# Patient Record
Sex: Female | Born: 1950 | Race: Black or African American | Hispanic: No | Marital: Married | Smoking: Never smoker
Health system: Southern US, Community
[De-identification: ages and names within clinical notes are randomized; demographics above are authoritative.]

## PROBLEM LIST (undated history)

## (undated) DIAGNOSIS — T7840XA Allergy, unspecified, initial encounter: Secondary | ICD-10-CM

## (undated) DIAGNOSIS — C801 Malignant (primary) neoplasm, unspecified: Secondary | ICD-10-CM

## (undated) DIAGNOSIS — Z9889 Other specified postprocedural states: Secondary | ICD-10-CM

## (undated) DIAGNOSIS — IMO0001 Reserved for inherently not codable concepts without codable children: Secondary | ICD-10-CM

## (undated) DIAGNOSIS — Z5189 Encounter for other specified aftercare: Secondary | ICD-10-CM

## (undated) DIAGNOSIS — K219 Gastro-esophageal reflux disease without esophagitis: Secondary | ICD-10-CM

## (undated) DIAGNOSIS — M1990 Unspecified osteoarthritis, unspecified site: Secondary | ICD-10-CM

## (undated) DIAGNOSIS — R112 Nausea with vomiting, unspecified: Secondary | ICD-10-CM

## (undated) HISTORY — DX: Malignant (primary) neoplasm, unspecified: C80.1

## (undated) HISTORY — DX: Unspecified osteoarthritis, unspecified site: M19.90

## (undated) HISTORY — DX: Reserved for inherently not codable concepts without codable children: IMO0001

## (undated) HISTORY — DX: Allergy, unspecified, initial encounter: T78.40XA

## (undated) HISTORY — DX: Encounter for other specified aftercare: Z51.89

## (undated) HISTORY — PX: BREAST SURGERY: SHX581

---

## 1982-01-13 HISTORY — PX: ABDOMINAL HYSTERECTOMY: SHX81

## 2002-01-11 ENCOUNTER — Encounter: Payer: Self-pay | Admitting: Internal Medicine

## 2002-01-11 ENCOUNTER — Ambulatory Visit (HOSPITAL_COMMUNITY): Admission: RE | Admit: 2002-01-11 | Discharge: 2002-01-11 | Payer: Self-pay | Admitting: Internal Medicine

## 2003-03-01 ENCOUNTER — Ambulatory Visit (HOSPITAL_COMMUNITY): Admission: RE | Admit: 2003-03-01 | Discharge: 2003-03-01 | Payer: Self-pay | Admitting: Internal Medicine

## 2003-03-27 ENCOUNTER — Ambulatory Visit (HOSPITAL_COMMUNITY): Admission: RE | Admit: 2003-03-27 | Discharge: 2003-03-27 | Payer: Self-pay | Admitting: Internal Medicine

## 2003-03-31 ENCOUNTER — Ambulatory Visit (HOSPITAL_COMMUNITY): Admission: RE | Admit: 2003-03-31 | Discharge: 2003-03-31 | Payer: Self-pay | Admitting: Internal Medicine

## 2003-06-21 ENCOUNTER — Emergency Department (HOSPITAL_COMMUNITY): Admission: EM | Admit: 2003-06-21 | Discharge: 2003-06-21 | Payer: Self-pay | Admitting: Emergency Medicine

## 2004-05-02 ENCOUNTER — Ambulatory Visit (HOSPITAL_COMMUNITY): Admission: RE | Admit: 2004-05-02 | Discharge: 2004-05-02 | Payer: Self-pay | Admitting: Internal Medicine

## 2004-10-15 ENCOUNTER — Ambulatory Visit (HOSPITAL_COMMUNITY): Admission: RE | Admit: 2004-10-15 | Discharge: 2004-10-15 | Payer: Self-pay | Admitting: Internal Medicine

## 2004-10-16 ENCOUNTER — Emergency Department (HOSPITAL_COMMUNITY): Admission: EM | Admit: 2004-10-16 | Discharge: 2004-10-16 | Payer: Self-pay | Admitting: Emergency Medicine

## 2004-11-08 ENCOUNTER — Ambulatory Visit: Payer: Self-pay | Admitting: Internal Medicine

## 2004-11-08 ENCOUNTER — Encounter (INDEPENDENT_AMBULATORY_CARE_PROVIDER_SITE_OTHER): Payer: Self-pay | Admitting: Internal Medicine

## 2004-11-08 ENCOUNTER — Ambulatory Visit (HOSPITAL_COMMUNITY): Admission: RE | Admit: 2004-11-08 | Discharge: 2004-11-08 | Payer: Self-pay | Admitting: Internal Medicine

## 2005-08-06 ENCOUNTER — Ambulatory Visit: Payer: Self-pay | Admitting: Family Medicine

## 2005-09-29 ENCOUNTER — Ambulatory Visit: Payer: Self-pay | Admitting: Family Medicine

## 2005-11-14 ENCOUNTER — Encounter: Admission: RE | Admit: 2005-11-14 | Discharge: 2005-11-14 | Payer: Self-pay | Admitting: Family Medicine

## 2006-02-13 ENCOUNTER — Ambulatory Visit: Payer: Self-pay | Admitting: Family Medicine

## 2006-12-16 ENCOUNTER — Encounter: Admission: RE | Admit: 2006-12-16 | Discharge: 2006-12-16 | Payer: Self-pay | Admitting: Family Medicine

## 2006-12-17 ENCOUNTER — Ambulatory Visit (HOSPITAL_COMMUNITY): Admission: RE | Admit: 2006-12-17 | Discharge: 2006-12-17 | Payer: Self-pay | Admitting: Family Medicine

## 2006-12-18 ENCOUNTER — Encounter: Payer: Self-pay | Admitting: Family Medicine

## 2006-12-18 LAB — CONVERTED CEMR LAB
BUN: 12 mg/dL (ref 6–23)
Basophils Relative: 0 % (ref 0–1)
Calcium: 9.4 mg/dL (ref 8.4–10.5)
Cholesterol: 159 mg/dL (ref 0–200)
Creatinine, Ser: 0.85 mg/dL (ref 0.40–1.20)
Eosinophils Absolute: 0.2 10*3/uL (ref 0.2–0.7)
Eosinophils Relative: 3 % (ref 0–5)
MCHC: 30.5 g/dL (ref 30.0–36.0)
MCV: 85.1 fL (ref 78.0–100.0)
Monocytes Absolute: 0.5 10*3/uL (ref 0.1–1.0)
Monocytes Relative: 8 % (ref 3–12)
Neutrophils Relative %: 62 % (ref 43–77)
Potassium: 4.3 meq/L (ref 3.5–5.3)
RBC: 4.7 M/uL (ref 3.87–5.11)
Triglycerides: 44 mg/dL (ref <150)

## 2006-12-28 ENCOUNTER — Ambulatory Visit: Payer: Self-pay | Admitting: Family Medicine

## 2006-12-28 ENCOUNTER — Other Ambulatory Visit: Admission: RE | Admit: 2006-12-28 | Discharge: 2006-12-28 | Payer: Self-pay | Admitting: Family Medicine

## 2006-12-28 ENCOUNTER — Encounter: Payer: Self-pay | Admitting: Family Medicine

## 2007-01-14 ENCOUNTER — Encounter: Payer: Self-pay | Admitting: Family Medicine

## 2007-07-21 ENCOUNTER — Encounter: Payer: Self-pay | Admitting: Family Medicine

## 2007-07-21 DIAGNOSIS — F411 Generalized anxiety disorder: Secondary | ICD-10-CM

## 2007-07-21 DIAGNOSIS — M25569 Pain in unspecified knee: Secondary | ICD-10-CM

## 2007-07-21 DIAGNOSIS — K5909 Other constipation: Secondary | ICD-10-CM

## 2007-07-21 DIAGNOSIS — K219 Gastro-esophageal reflux disease without esophagitis: Secondary | ICD-10-CM

## 2007-12-17 ENCOUNTER — Encounter: Admission: RE | Admit: 2007-12-17 | Discharge: 2007-12-17 | Payer: Self-pay | Admitting: Family Medicine

## 2007-12-29 ENCOUNTER — Encounter: Payer: Self-pay | Admitting: Family Medicine

## 2007-12-29 ENCOUNTER — Ambulatory Visit: Payer: Self-pay | Admitting: Family Medicine

## 2007-12-29 ENCOUNTER — Other Ambulatory Visit: Admission: RE | Admit: 2007-12-29 | Discharge: 2007-12-29 | Payer: Self-pay | Admitting: Family Medicine

## 2007-12-29 DIAGNOSIS — R5383 Other fatigue: Secondary | ICD-10-CM

## 2007-12-29 DIAGNOSIS — R5381 Other malaise: Secondary | ICD-10-CM

## 2007-12-29 DIAGNOSIS — D239 Other benign neoplasm of skin, unspecified: Secondary | ICD-10-CM | POA: Insufficient documentation

## 2007-12-29 LAB — CONVERTED CEMR LAB: OCCULT 1: NEGATIVE

## 2007-12-30 ENCOUNTER — Encounter: Payer: Self-pay | Admitting: Family Medicine

## 2007-12-30 LAB — CONVERTED CEMR LAB
CO2: 24 meq/L (ref 19–32)
Calcium: 9 mg/dL (ref 8.4–10.5)
Chloride: 107 meq/L (ref 96–112)
Creatinine, Ser: 0.8 mg/dL (ref 0.40–1.20)
Eosinophils Relative: 4 % (ref 0–5)
Glucose, Bld: 81 mg/dL (ref 70–99)
HCT: 40 % (ref 36.0–46.0)
Hemoglobin: 11.8 g/dL — ABNORMAL LOW (ref 12.0–15.0)
LDL Cholesterol: 97 mg/dL (ref 0–99)
Lymphocytes Relative: 29 % (ref 12–46)
Lymphs Abs: 2.2 10*3/uL (ref 0.7–4.0)
Monocytes Absolute: 0.6 10*3/uL (ref 0.1–1.0)
Neutro Abs: 4.4 10*3/uL (ref 1.7–7.7)
RBC: 4.71 M/uL (ref 3.87–5.11)
Retic Ct Pct: 0.6 % (ref 0.4–3.1)
Total CHOL/HDL Ratio: 2.7
WBC: 7.5 10*3/uL (ref 4.0–10.5)

## 2008-01-10 ENCOUNTER — Encounter: Payer: Self-pay | Admitting: Family Medicine

## 2008-02-18 ENCOUNTER — Encounter: Payer: Self-pay | Admitting: Family Medicine

## 2008-12-18 ENCOUNTER — Encounter: Admission: RE | Admit: 2008-12-18 | Discharge: 2008-12-18 | Payer: Self-pay | Admitting: Family Medicine

## 2009-01-11 ENCOUNTER — Ambulatory Visit: Payer: Self-pay | Admitting: Family Medicine

## 2009-01-11 ENCOUNTER — Other Ambulatory Visit: Admission: RE | Admit: 2009-01-11 | Discharge: 2009-01-11 | Payer: Self-pay | Admitting: Family Medicine

## 2009-01-11 DIAGNOSIS — E559 Vitamin D deficiency, unspecified: Secondary | ICD-10-CM

## 2009-01-11 DIAGNOSIS — L910 Hypertrophic scar: Secondary | ICD-10-CM

## 2009-01-11 LAB — CONVERTED CEMR LAB: OCCULT 1: NEGATIVE

## 2009-01-13 HISTORY — PX: TOTAL KNEE ARTHROPLASTY: SHX125

## 2009-01-18 ENCOUNTER — Telehealth: Payer: Self-pay | Admitting: Family Medicine

## 2009-01-22 ENCOUNTER — Encounter: Payer: Self-pay | Admitting: Family Medicine

## 2009-01-25 LAB — CONVERTED CEMR LAB
BUN: 14 mg/dL (ref 6–23)
Chloride: 106 meq/L (ref 96–112)
Cholesterol: 165 mg/dL (ref 0–200)
HDL: 61 mg/dL (ref >39)
Hemoglobin: 12.1 g/dL (ref 12.0–15.0)
LDL Cholesterol: 95 mg/dL (ref 0–99)
MCHC: 32.6 g/dL (ref 30.0–36.0)
Potassium: 4.3 meq/L (ref 3.5–5.3)
RDW: 15.8 % — ABNORMAL HIGH (ref 11.5–15.5)
Sodium: 142 meq/L (ref 135–145)
Total CHOL/HDL Ratio: 2.7
Triglycerides: 44 mg/dL (ref <150)
VLDL: 9 mg/dL (ref 0–40)

## 2009-02-20 ENCOUNTER — Encounter: Payer: Self-pay | Admitting: Family Medicine

## 2009-02-21 ENCOUNTER — Ambulatory Visit: Payer: Self-pay | Admitting: Family Medicine

## 2009-02-21 ENCOUNTER — Ambulatory Visit (HOSPITAL_COMMUNITY): Admission: RE | Admit: 2009-02-21 | Discharge: 2009-02-21 | Payer: Self-pay | Admitting: Family Medicine

## 2009-02-21 DIAGNOSIS — R109 Unspecified abdominal pain: Secondary | ICD-10-CM

## 2009-02-21 DIAGNOSIS — M546 Pain in thoracic spine: Secondary | ICD-10-CM

## 2009-02-21 LAB — CONVERTED CEMR LAB
Blood in Urine, dipstick: NEGATIVE
Ketones, urine, test strip: NEGATIVE
Nitrite: NEGATIVE
Protein, U semiquant: NEGATIVE
Specific Gravity, Urine: 1.015
Urobilinogen, UA: 1
Vit D, 25-Hydroxy: 24 ng/mL — ABNORMAL LOW (ref 30–89)
WBC Urine, dipstick: NEGATIVE

## 2009-02-22 DIAGNOSIS — N3000 Acute cystitis without hematuria: Secondary | ICD-10-CM

## 2009-03-21 ENCOUNTER — Telehealth: Payer: Self-pay | Admitting: Family Medicine

## 2009-03-22 ENCOUNTER — Ambulatory Visit: Payer: Self-pay | Admitting: Family Medicine

## 2009-03-22 DIAGNOSIS — B373 Candidiasis of vulva and vagina: Secondary | ICD-10-CM | POA: Insufficient documentation

## 2009-03-24 ENCOUNTER — Encounter: Payer: Self-pay | Admitting: Family Medicine

## 2009-03-26 ENCOUNTER — Telehealth: Payer: Self-pay | Admitting: Family Medicine

## 2009-03-26 LAB — CONVERTED CEMR LAB: Gardnerella vaginalis: NEGATIVE

## 2009-04-24 ENCOUNTER — Telehealth: Payer: Self-pay | Admitting: Family Medicine

## 2009-04-26 ENCOUNTER — Encounter: Payer: Self-pay | Admitting: Family Medicine

## 2009-10-24 ENCOUNTER — Ambulatory Visit: Payer: Self-pay | Admitting: Family Medicine

## 2009-10-25 ENCOUNTER — Telehealth: Payer: Self-pay | Admitting: Family Medicine

## 2009-10-26 ENCOUNTER — Encounter: Payer: Self-pay | Admitting: Family Medicine

## 2009-11-08 ENCOUNTER — Encounter (INDEPENDENT_AMBULATORY_CARE_PROVIDER_SITE_OTHER): Payer: Self-pay | Admitting: *Deleted

## 2009-11-09 ENCOUNTER — Encounter: Payer: Self-pay | Admitting: Family Medicine

## 2009-11-13 HISTORY — PX: JOINT REPLACEMENT: SHX530

## 2009-11-28 ENCOUNTER — Inpatient Hospital Stay (HOSPITAL_COMMUNITY): Admission: RE | Admit: 2009-11-28 | Discharge: 2009-12-02 | Payer: Self-pay | Admitting: Orthopedic Surgery

## 2009-12-31 ENCOUNTER — Encounter (HOSPITAL_COMMUNITY)
Admission: RE | Admit: 2009-12-31 | Discharge: 2010-01-30 | Payer: Self-pay | Source: Home / Self Care | Attending: Orthopedic Surgery | Admitting: Orthopedic Surgery

## 2010-01-03 ENCOUNTER — Telehealth (INDEPENDENT_AMBULATORY_CARE_PROVIDER_SITE_OTHER): Payer: Self-pay | Admitting: *Deleted

## 2010-01-04 DIAGNOSIS — Z9189 Other specified personal risk factors, not elsewhere classified: Secondary | ICD-10-CM | POA: Insufficient documentation

## 2010-01-08 ENCOUNTER — Ambulatory Visit: Payer: Self-pay | Admitting: Family Medicine

## 2010-01-10 ENCOUNTER — Encounter
Admission: RE | Admit: 2010-01-10 | Discharge: 2010-01-10 | Payer: Self-pay | Source: Home / Self Care | Attending: Family Medicine | Admitting: Family Medicine

## 2010-01-17 ENCOUNTER — Encounter: Payer: Self-pay | Admitting: Family Medicine

## 2010-01-17 ENCOUNTER — Encounter
Admission: RE | Admit: 2010-01-17 | Discharge: 2010-01-17 | Payer: Self-pay | Source: Home / Self Care | Attending: Family Medicine | Admitting: Family Medicine

## 2010-01-21 ENCOUNTER — Encounter: Payer: Self-pay | Admitting: Family Medicine

## 2010-01-22 ENCOUNTER — Telehealth (INDEPENDENT_AMBULATORY_CARE_PROVIDER_SITE_OTHER): Payer: Self-pay | Admitting: *Deleted

## 2010-01-23 ENCOUNTER — Encounter
Admission: RE | Admit: 2010-01-23 | Discharge: 2010-01-23 | Payer: Self-pay | Source: Home / Self Care | Attending: Family Medicine | Admitting: Family Medicine

## 2010-01-25 ENCOUNTER — Ambulatory Visit: Payer: Self-pay | Admitting: Oncology

## 2010-01-25 ENCOUNTER — Encounter: Payer: Self-pay | Admitting: Family Medicine

## 2010-01-29 ENCOUNTER — Ambulatory Visit (HOSPITAL_BASED_OUTPATIENT_CLINIC_OR_DEPARTMENT_OTHER)
Admission: RE | Admit: 2010-01-29 | Discharge: 2010-02-01 | Payer: BC Managed Care – PPO | Source: Home / Self Care | Attending: Radiation Oncology | Admitting: Radiation Oncology

## 2010-01-30 ENCOUNTER — Encounter: Payer: Self-pay | Admitting: Family Medicine

## 2010-01-30 ENCOUNTER — Telehealth: Payer: Self-pay | Admitting: Family Medicine

## 2010-01-30 LAB — CBC WITH DIFFERENTIAL/PLATELET
BASO%: 0 % (ref 0.0–2.0)
Basophils Absolute: 0 10*3/uL (ref 0.0–0.1)
EOS%: 0 % (ref 0.0–7.0)
Eosinophils Absolute: 0 10*3/uL (ref 0.0–0.5)
HCT: 37.3 % (ref 34.8–46.6)
HGB: 11.6 g/dL (ref 11.6–15.9)
LYMPH%: 9.5 % — ABNORMAL LOW (ref 14.0–49.7)
MCH: 26 pg (ref 25.1–34.0)
MCHC: 31.1 g/dL — ABNORMAL LOW (ref 31.5–36.0)
MCV: 83.4 fL (ref 79.5–101.0)
MONO#: 0.8 10*3/uL (ref 0.1–0.9)
MONO%: 6.4 % (ref 0.0–14.0)
NEUT#: 10.6 10*3/uL — ABNORMAL HIGH (ref 1.5–6.5)
NEUT%: 84.1 % — ABNORMAL HIGH (ref 38.4–76.8)
Platelets: 216 10*3/uL (ref 145–400)
RBC: 4.47 10*6/uL (ref 3.70–5.45)
RDW: 16.1 % — ABNORMAL HIGH (ref 11.2–14.5)
WBC: 12.6 10*3/uL — ABNORMAL HIGH (ref 3.9–10.3)
lymph#: 1.2 10*3/uL (ref 0.9–3.3)

## 2010-01-30 LAB — COMPREHENSIVE METABOLIC PANEL
ALT: 10 U/L (ref 0–35)
AST: 21 U/L (ref 0–37)
Albumin: 4.1 g/dL (ref 3.5–5.2)
Alkaline Phosphatase: 134 U/L — ABNORMAL HIGH (ref 39–117)
BUN: 13 mg/dL (ref 6–23)
CO2: 28 mEq/L (ref 19–32)
Calcium: 9.5 mg/dL (ref 8.4–10.5)
Chloride: 106 mEq/L (ref 96–112)
Creatinine, Ser: 0.82 mg/dL (ref 0.40–1.20)
Glucose, Bld: 111 mg/dL — ABNORMAL HIGH (ref 70–99)
Potassium: 3.9 mEq/L (ref 3.5–5.3)
Sodium: 144 mEq/L (ref 135–145)
Total Bilirubin: 0.3 mg/dL (ref 0.3–1.2)
Total Protein: 7 g/dL (ref 6.0–8.3)

## 2010-01-30 LAB — CANCER ANTIGEN 27.29: CA 27.29: 32 U/mL (ref 0–39)

## 2010-02-01 ENCOUNTER — Ambulatory Visit (HOSPITAL_COMMUNITY)
Admission: RE | Admit: 2010-02-01 | Discharge: 2010-02-01 | Payer: Self-pay | Source: Home / Self Care | Attending: General Surgery | Admitting: General Surgery

## 2010-02-01 ENCOUNTER — Telehealth: Payer: Self-pay | Admitting: Family Medicine

## 2010-02-02 NOTE — Op Note (Signed)
NAMEHALA, NARULA               ACCOUNT NO.:  [id number]  MEDICAL RECORD NO.:  [id number]          PATIENT TYPE:  AMB  LOCATION:  DAY                          FACILITY:  [facility]  PHYSICIAN:  Adolph Pollack, M.D.DATE OF BIRTH:  10-17-1950  DATE OF PROCEDURE: DATE OF DISCHARGE:  02/01/2010                              OPERATIVE REPORT   PREOPERATIVE DIAGNOSIS:  Right breast cancer.  POSTOPERATIVE DIAGNOSIS:  Right breast cancer.  PROCEDURE:  Ultrasound-guided Port-A-Cath insertion into right internal jugular vein.  SURGEON:  Adolph Pollack, M.D.  ANESTHESIA:  Local (mixture of Marcaine and lidocaine) with MAC.  INDICATION:  Ms. Yount is a 60 year old female with right breast cancer.  She is a candidate for neoadjuvant therapy and needs long-term venous access.  Thus, Port-A-Cath is planned.  We discussed the procedure risks and aftercare preoperatively.  TECHNIQUE:  She is brought to the operating room, placed supine on the operating table, given some intravenous sedation.  A roll was placed on her back.  The upper chest wall and neck were sterilely prepped and draped.  Her head was then turned to the right.  Using the ultrasound, identified the right internal jugular vein.  Using 16 gauge needle under ultrasound guidance, I cannulated the right internal jugular vein and blood withdrew easily.  I then threaded a wire through the needle into the internal jugular vein under ultrasound guidance and then placed this into the superior vena cava.  Its position was verified by way of fluoroscopy.  Following this, the needle was removed.  I then anesthetized an area high in the  right chest wall just inferior to the clavicle using the local anesthetic.  This was done superficially and deep.  I then made an incision through the skin and subcutaneous tissues and using electrocautery, created a pocket for the port.  I then made an incision around the wire and the neck and  dilated this with a hemostat.  I used local anesthetic to anesthetize subcutaneous tissue between the neck incision and the chest wall incision.  I then tunneled the catheter from the chest wall incision up through the neck incision.  A dilator introducer complex was placed over the wire and its position verified in vena cava by way of fluoroscopy. The dilator wires were removed, and the catheter was threaded through the peel-away sheath introducer into the right heart.  The peel-away sheath was removed.  Under fluoroscopic guidance, I pulled the catheter back until the tip was in the distal superior vena cava.  I then cut the catheter and attached it to the port.  The port aspirated blood and flushed easily.  The port was then anchored to the chest wall with interrupted 2-0 Vicryl sutures.  Following this, I placed concentrated heparin into the port.  I then closed the skin incision in the neck with a 4-0 Monocryl subcuticular stitch.  I closed the subcutaneous tissue over the port with a running 3- 0 Vicryl suture.  The skin incision in the chest wall was closed with running a 4-0 Monocryl subcuticular stitch.  Steri-Strips and sterile dressing were applied.  She tolerated  procedure well without apparent complications and was taken to recovery room in satisfactory condition.  A portable chest x- ray is pending.  She was given discharge instructions and already has pain medicine.  [facility]     Adolph Pollack, M.D.     Kari Baars  D:  02/01/2010  T:  02/01/2010  Job:  [id number]  Electronically Signed by Avel Peace M.D. on 02/02/2010 10:09:33 AM

## 2010-02-03 ENCOUNTER — Encounter: Payer: Self-pay | Admitting: Family Medicine

## 2010-02-04 ENCOUNTER — Ambulatory Visit (HOSPITAL_COMMUNITY)
Admission: RE | Admit: 2010-02-04 | Discharge: 2010-02-04 | Payer: Self-pay | Source: Home / Self Care | Attending: Oncology | Admitting: Oncology

## 2010-02-04 LAB — PROTIME-INR: Prothrombin Time: 13.8 seconds (ref 11.6–15.2)

## 2010-02-04 LAB — SURGICAL PCR SCREEN: Staphylococcus aureus: NEGATIVE

## 2010-02-13 ENCOUNTER — Ambulatory Visit: Payer: Self-pay | Admitting: Radiation Oncology

## 2010-02-13 ENCOUNTER — Encounter (HOSPITAL_COMMUNITY)
Admission: RE | Admit: 2010-02-13 | Discharge: 2010-02-13 | Disposition: A | Discharge status: Home / Self Care | Payer: BC Managed Care – PPO | Source: Ambulatory Visit | Attending: Family Medicine | Admitting: Family Medicine

## 2010-02-13 DIAGNOSIS — M25669 Stiffness of unspecified knee, not elsewhere classified: Secondary | ICD-10-CM | POA: Insufficient documentation

## 2010-02-13 DIAGNOSIS — Z96659 Presence of unspecified artificial knee joint: Secondary | ICD-10-CM | POA: Insufficient documentation

## 2010-02-13 DIAGNOSIS — R262 Difficulty in walking, not elsewhere classified: Secondary | ICD-10-CM | POA: Insufficient documentation

## 2010-02-13 DIAGNOSIS — M25569 Pain in unspecified knee: Secondary | ICD-10-CM | POA: Insufficient documentation

## 2010-02-13 DIAGNOSIS — Z4789 Encounter for other orthopedic aftercare: Secondary | ICD-10-CM | POA: Insufficient documentation

## 2010-02-13 DIAGNOSIS — IMO0001 Reserved for inherently not codable concepts without codable children: Secondary | ICD-10-CM | POA: Insufficient documentation

## 2010-02-14 ENCOUNTER — Encounter (HOSPITAL_BASED_OUTPATIENT_CLINIC_OR_DEPARTMENT_OTHER): Payer: BC Managed Care – PPO | Admitting: Oncology

## 2010-02-14 ENCOUNTER — Encounter: Payer: Self-pay | Admitting: Family Medicine

## 2010-02-14 DIAGNOSIS — C50919 Malignant neoplasm of unspecified site of unspecified female breast: Secondary | ICD-10-CM

## 2010-02-14 LAB — CBC WITH DIFFERENTIAL/PLATELET
Basophils Absolute: 0 10*3/uL (ref 0.0–0.1)
Eosinophils Absolute: 0.2 10*3/uL (ref 0.0–0.5)
HCT: 37.6 % (ref 34.8–46.6)
HGB: 11.8 g/dL (ref 11.6–15.9)
LYMPH%: 22 % (ref 14.0–49.7)
MCHC: 31.4 g/dL — ABNORMAL LOW (ref 31.5–36.0)
MONO#: 0.9 10*3/uL (ref 0.1–0.9)
NEUT%: 62.8 % (ref 38.4–76.8)
Platelets: 229 10*3/uL (ref 145–400)
WBC: 6.9 10*3/uL (ref 3.9–10.3)
lymph#: 1.5 10*3/uL (ref 0.9–3.3)

## 2010-02-14 NOTE — Letter (Signed)
Summary: DR. TODD Laurie Panda  DR. TODD J ROSENBOWER   Imported By: Lind Guest 02/04/2010 14:46:36  _____________________________________________________________________  External Attachment:    Type:   Image     Comment:   External Document

## 2010-02-14 NOTE — Letter (Signed)
Summary: Recall, Screening Colonoscopy Only  [facility]  [address]   [place], [place] [address]   Phone: [phone]  Fax: [phone]    November 08, 2009  KAEDYN BELARDO 1971 [place] Valentino Saxon [address] [place], [place]  [address] 10/18/1950   Dear Ms. Irigoyen,   Our records indicate it is time to schedule your colonoscopy.    Please call our office at [phone] and ask for the nurse.   Thank you,    Hendricks Limes, LPN Cloria Spring, LPN  [facility] Ph: [phone]   Fax: [phone]

## 2010-02-14 NOTE — Letter (Signed)
Summary: Letter  Letter   Imported By: Lind Guest 04/26/2009 15:59:05  _____________________________________________________________________  External Attachment:    Type:   Image     Comment:   External Document

## 2010-02-14 NOTE — Progress Notes (Signed)
Summary: CALL  Phone Note Call from Patient   Summary of Call: HAS A KNOT ON HER VAGINA AREA ON THE OUTSIDE LITTLE TENDER AND SWOLLEN LAST TIME THIS HAPPEN THE DR. CALLED IN TRIAMCINOLONE 0.1% IT HELPED FIX IT  NO DISCHARGE NOT HURTING  [place] PHAR. CALL HER BACK AT [phone] TO LET HER KNOW Initial call taken by: Lind Guest,  March 21, 2009 10:55 AM  Follow-up for Phone Call        patient has appt tomorrow with PA Follow-up by: Adella Hare LPN,  March 21, 2009 12:52 PM

## 2010-02-14 NOTE — Progress Notes (Signed)
Summary: handicapp stickers  Phone Note Call from Patient   Summary of Call: lost handicapp stickers and needs to get doc to give her 2 of them [phone] Initial call taken by: Rudene Anda,  January 18, 2009 2:16 PM  Follow-up for Phone Call        pls fill in form, i will sign and write reason on vback, let her know to collect when donelv on my desk Follow-up by: Worthy Keeler LPN,  January 22, 2009 2:42 PM  Additional Follow-up for Phone Call Additional follow up Details #1::        papers filled out and put up front for pick up Additional Follow-up by: Worthy Keeler LPN,  January 22, 2009 2:43 PM    Additional Follow-up for Phone Call Additional follow up Details #2::    thanks for filling in pls call her to collect Follow-up by: Syliva Overman MD,  January 22, 2009 11:59 AM

## 2010-02-14 NOTE — Letter (Signed)
Summary: DEMO  DEMO   Imported By: Lind Guest 08/29/2009 16:45:09  _____________________________________________________________________  External Attachment:    Type:   Image     Comment:   External Document

## 2010-02-14 NOTE — Letter (Signed)
Summary: CONSULTS  CONSULTS   Imported By: Lind Guest 08/29/2009 16:44:44  _____________________________________________________________________  External Attachment:    Type:   Image     Comment:   External Document

## 2010-02-14 NOTE — Assessment & Plan Note (Signed)
Summary: BACK   Vital Signs:  Patient profile:   60 year old female Menstrual status:  hysterectomy Height:      64 inches Weight:      182.25 pounds BMI:     31.40 O2 Sat:      98 % on Room air Pulse rhythm:   regular Resp:     16 per minute BP sitting:   110 / 70  (left arm)  Vitals Entered By: Worthy Keeler LPN (February 21, 2009 3:45 PM)  Nutrition Counseling: Patient's BMI is greater than 25 and therefore counseled on weight management options.  O2 Flow:  Room air CC: follow-up visit Is Patient Diabetic? No Pain Assessment Patient in pain? yes     Location: right flank  Intensity: 10 Type: sharp Onset of pain  With activity   Primary Care Provider:  Syliva Overman MD  CC:  follow-up visit.  History of Present Illness: 2 day h/o right flank and mid back pain. Pt states she just woke with the pain. She did go to work , and was barely able to get home because of pain. I t is non radiating, . it is aggravated by physical activity, as in walking, she has not had thiss type of pain before, she denies abdominal pain, nausea or vomitting, she has not noted blood in the urine.   Current Medications (verified): 1)  Vitamin D (Ergocalciferol) 50000 Unit Caps (Ergocalciferol) .... Take One Tab Every Week  Allergies (verified): 1)  ! Codeine  Review of Systems      See HPI ENT:  Denies hoarseness, nasal congestion, sinus pressure, and sore throat. CV:  Denies chest pain or discomfort, palpitations, and swelling of feet. Resp:  Denies cough and sputum productive. GI:  Denies abdominal pain, constipation, diarrhea, nausea, and vomiting. GU:  Complains of urinary frequency; 3 day history. MS:  See HPI. Derm:  Denies itching, lesion(s), and rash. Psych:  Complains of anxiety; denies depression. Heme:  Denies abnormal bruising and bleeding. Allergy:  Denies hives or rash and sneezing.  Physical Exam  General:  Well-developed,well-nourished,in no acute distress;  alert,appropriate and cooperative throughout examination. pt bent over in pain and walking is limited by severe pain al;so lying supine is painful. HEENT: No facial asymmetry,  EOMI, No sinus tenderness, TM's Clear, oropharynx  pink and moist.   Chest: Clear to auscultation bilaterally.  CVS: S1, S2, No murmurs, No S3.   Abd: Soft,no localised tenderness or guarding ZO:[id number] ROM spine,adequate in  hips, shoulders and knees.  Ext: No edema.   CNS: CN 2-12 intact, power tone and sensation normal throughout.   Skin: Intact, no visible lesions or rashes.  Psych: Good eye contact, normal affect.  Memory intact, not anxious or depressed appearing.    Impression & Recommendations:  Problem # 1:  BACK PAIN, THORACIC REGION, RIGHT (ICD-724.1) Assessment Comment Only  Her updated medication list for this problem includes:    Ibuprofen 800 Mg Tabs (Ibuprofen) .Marland Kitchen... Take 1 tablet by mouth three times a day , start today    Cyclobenzaprine Hcl 10 Mg Tabs (Cyclobenzaprine hcl) .Marland Kitchen... Take 1 tab by mouth at bedtime as needed  Orders: Diagnostic X-Ray/Fluoroscopy (Diagnostic X-Ray/Flu) Depo- Medrol 80mg  (J1040) Ketorolac-Toradol 15mg  (J8119) Admin of Therapeutic Inj  intramuscular or subcutaneous (14[address])  Problem # 2:  FLANK PAIN, RIGHT (ICD-789.09) Assessment: Comment Only  Her updated medication list for this problem includes:    Ibuprofen 800 Mg Tabs (Ibuprofen) .Marland Kitchen... Take 1 tablet by  mouth three times a day , start today    Cyclobenzaprine Hcl 10 Mg Tabs (Cyclobenzaprine hcl) .Marland Kitchen... Take 1 tab by mouth at bedtime as needed  Orders: Radiology Referral (Radiology)  Problem # 3:  VITAMIN D DEFICIENCY (ICD-268.9) Assessment: Improved Improved , can take otc supplements only at this time  Problem # 4:  ACUTE CYSTITIS (ICD-595.0) Assessment: Comment Only  Orders: UA Dipstick W/ Micro (manual) ([address]), neg pt reaassure  Complete Medication List: 1)  Vitamin D (ergocalciferol) 50000  Unit Caps (Ergocalciferol) .... Take one tab every week 2)  Ibuprofen 800 Mg Tabs (Ibuprofen) .... Take 1 tablet by mouth three times a day , start today 3)  Prednisone (pak) 5 Mg Tabs (Prednisone) .... Use as directed 4)  Cyclobenzaprine Hcl 10 Mg Tabs (Cyclobenzaprine hcl) .... Take 1 tab by mouth at bedtime as needed  Patient Instructions: 1)  F/U  in 6 weeks. 2)  You are getting injections  for pain and meds are also sent to your pharmacy. 3)  You are going now  to get an ultrasound  of your right kidney to eval pain. 4)  Work excuse to return on 02/26/2009 Prescriptions: CYCLOBENZAPRINE HCL 10 MG TABS (CYCLOBENZAPRINE HCL) Take 1 tab by mouth at bedtime as needed  #30 x 0   Entered and Authorized by:   Syliva Overman MD   Signed by:   Syliva Overman MD on 02/21/2009   Method used:   Electronically to        [facility] (retail)       [address]       [place], [place]  [address]       Ph: [phone] or [phone]       Fax: [phone]   RxID:   [id number] PREDNISONE (PAK) 5 MG TABS (PREDNISONE) Use as directed  #21 x 0   Entered and Authorized by:   Syliva Overman MD   Signed by:   Syliva Overman MD on 02/21/2009   Method used:   Electronically to        [facility] (retail)       [address]       [place], [place]  [address]       Ph: [phone] or [phone]       Fax: [phone]   RxID:   [id number] IBUPROFEN 800 MG TABS (IBUPROFEN) Take 1 tablet by mouth three times a day , start today  #30 x 0   Entered and Authorized by:   Syliva Overman MD   Signed by:   Syliva Overman MD on 02/21/2009   Method used:   Electronically to        [facility] (retail)       [address]       [place], [place]  [address]       Ph: [phone] or [phone]       Fax: [phone]   RxID:   [id number]    Medication Administration  Injection # 1:     Medication: Depo- Medrol 80mg     Diagnosis: BACK PAIN, THORACIC REGION, RIGHT (ICD-724.1)    Route: IM    Site: RUOQ gluteus    Exp Date: 10/11    Lot #: [id number]    Mfr: Pharmacia    Patient tolerated injection without complications    Given by:  Worthy Keeler LPN (February 21, 2009 4:27 PM)  Injection # 2:    Medication: Ketorolac-Toradol 15mg     Diagnosis: BACK PAIN, THORACIC REGION, RIGHT (ICD-724.1)    Route: IM    Site: RUOQ gluteus    Exp Date: 08/14/2010    Lot #: [id number]    Mfr: [email or web address]    Comments: toradol 60mg  given    Patient tolerated injection without complications    Given by: Worthy Keeler LPN (February 21, 2009 4:28 PM)  Orders Added: 1)  Est. Patient Level IV [[address]] 2)  Diagnostic X-Ray/Fluoroscopy [Diagnostic X-Ray/Flu] 3)  Depo- Medrol 80mg  [J1040] 4)  Ketorolac-Toradol 15mg  [J1885] 5)  Admin of Therapeutic Inj  intramuscular or subcutaneous [[address]] 6)  UA Dipstick W/ Micro (manual) [81000] 7)  Radiology Referral [Radiology]   Laboratory Results   Urine Tests  Date/Time Received: February 21, 2009  Date/Time Reported: February 21, 2009   Routine Urinalysis   Color: yellow Appearance: Clear Glucose: negative   (Normal Range: Negative) Bilirubin: negative   (Normal Range: Negative) Ketone: negative   (Normal Range: Negative) Spec. Gravity: 1.015   (Normal Range: 1.003-1.035) Blood: negative   (Normal Range: Negative) pH: 6.0   (Normal Range: 5.0-8.0) Protein: negative   (Normal Range: Negative) Urobilinogen: 1.0   (Normal Range: 0-1) Nitrite: negative   (Normal Range: Negative) Leukocyte Esterace: negative   (Normal Range: Negative)

## 2010-02-14 NOTE — Letter (Signed)
Summary: REFFERAL SHEET  REFFERAL SHEET   Imported By: Lind Guest 08/29/2009 16:46:47  _____________________________________________________________________  External Attachment:    Type:   Image     Comment:   External Document

## 2010-02-14 NOTE — Letter (Addendum)
Summary: TRANSFERRED RECORDS  TRANSFERRED RECORDS   Imported By: Lind Guest 10/10/2009 16:39:43  _____________________________________________________________________  External Attachments:     1. Type:   Image          Comment:   External Document    2. Type:   Image          Comment:   External Document

## 2010-02-14 NOTE — Letter (Signed)
Summary: X RAYS  X RAYS   Imported By: Lind Guest 08/29/2009 16:47:14  _____________________________________________________________________  External Attachment:    Type:   Image     Comment:   External Document

## 2010-02-14 NOTE — Assessment & Plan Note (Signed)
Summary: office visit   Vital Signs:  Patient profile:   60 year old female Menstrual status:  hysterectomy Height:      64 inches Weight:      179.25 pounds O2 Sat:      98 % on Room air Pulse rate:   76 / minute Pulse rhythm:   regular Resp:     16 per minute BP sitting:   126 / 70  (left arm)  Vitals Entered By: Mauricia Area, CMA  O2 Flow:  Room air CC: follow up. Right knee giving her problems.   Primary Care Provider:  Syliva Overman MD  CC:  follow up. Right knee giving her problems..  History of Present Illness: c/o uncontrolled right knee pain and instability x 5 days. has had probs for yrs and has been "waiting on surgery", feels as though this is the right time to have surgical eval. She has had arthroscopy in the same knee over 15 yrs agio. She is otherwise well withno complaints orconcerns. She takes no meds regularly.  Allergies (verified): 1)  ! Codeine  Review of Systems      See HPI General:  Denies chills, fatigue, and fever. Eyes:  Complains of vision loss-both eyes; denies discharge, eye pain, and red eye; wears corrective lenses. ENT:  Denies hoarseness and nasal congestion. CV:  Denies chest pain or discomfort, palpitations, shortness of breath with exertion, and swelling of feet. Resp:  Denies coughing up blood and sputum productive. GI:  Denies abdominal pain, constipation, diarrhea, nausea, and vomiting. GU:  Denies dysuria and urinary frequency. MS:  Complains of joint pain and stiffness; acute right knee pain and swelling x 4 days, unable to weight bear, and using a cane, remote h/o arthroscopy to same in 1995. Psych:  Denies anxiety and depression. Endo:  Denies cold intolerance, excessive thirst, excessive urination, and polyuria. Heme:  Denies abnormal bruising and bleeding. Allergy:  Denies hives or rash and itching eyes.  Physical Exam  General:  Well-developed,well-nourished,in no acute distress; alert,appropriate and cooperative  throughout examinationAmbulating with a cane for stability. HEENT: No facial asymmetry,  EOMI, No sinus tenderness, TM's Clear, oropharynx  pink and moist.   Chest: Clear to auscultation bilaterally.  CVS: S1, S2, No murmurs, No S3.   Abd: Soft, Nontender.  MS: Adequate ROM spine, hips, shoulders and  reduced in right knee, which is swollen and deformed , with crepitus.  Ext: No edema.   CNS: CN 2-12 intact, power tone and sensation normal throughout.   Skin: Intact, no visible lesions or rashes.  Psych: Good eye contact, normal affect.  Memory intact, not anxious or depressed appearing.    Impression & Recommendations:  Problem # 1:  KNEE PAIN, RIGHT (ICD-719.46) Assessment Deteriorated  Her updated medication list for this problem includes:    Ibuprofen 800 Mg Tabs (Ibuprofen) .Marland Kitchen... Take 1 tablet by mouth three times a day , start today    Cyclobenzaprine Hcl 10 Mg Tabs (Cyclobenzaprine hcl) .Marland Kitchen... Take 1 tab by mouth at bedtime as needed  Future Orders: Orthopedic Referral (Ortho) ... 10/25/2009  Complete Medication List: 1)  Vitamin D (ergocalciferol) 50000 Unit Caps (Ergocalciferol) .... Take one tab every week 2)  Ibuprofen 800 Mg Tabs (Ibuprofen) .... Take 1 tablet by mouth three times a day , start today 3)  Cyclobenzaprine Hcl 10 Mg Tabs (Cyclobenzaprine hcl) .... Take 1 tab by mouth at bedtime as needed 4)  Fluconazole 150 Mg Tabs (Fluconazole) .Marland Kitchen.. 1 by mouth for  yeast infection 5)  Terconazole 0.8 % Crea (Terconazole) .... Insert 1 appl vaginally at bedtime x 3 d  Patient Instructions: 1)  CPE in end December to mid January 2)  You will be referred to Dr Eulah Pont, we will call tomorrow with appt at your work. Pls call at around 3 in the afternoon , ask for referral staff 3)  pls be careful, use the assistive device , so you do not fall, stay away from steps

## 2010-02-14 NOTE — Progress Notes (Signed)
Summary: YEAST INFECTION STILL  Phone Note Call from Patient   Summary of Call: THE RILL YOU RX HER HAS HELPED SOME BUT NOT COMPLETELY BETTER AND WOULD LIKE FOR  YOU TO CALL HER AT [phone] TO SEE WHAT ELSE SHE CAN DO OR TAKE  Initial call taken by: Lind Guest,  March 26, 2009 8:44 AM  Follow-up for Phone Call        I have rxd Terazol 3 vag cream for her to use. Follow-up by: Esperanza Sheets PA,  March 26, 2009 10:11 AM  Additional Follow-up for Phone Call Additional follow up Details #1::        called number provided, no answer Additional Follow-up by: Everitt Amber LPN,  March 26, 2009 1:21 PM    Additional Follow-up for Phone Call Additional follow up Details #2::    patient aware Follow-up by: Adella Hare LPN,  March 26, 2009 2:31 PM  New/Updated Medications: TERCONAZOLE 0.8 % CREA (TERCONAZOLE) insert 1 appl vaginally at bedtime x 3 d Prescriptions: TERCONAZOLE 0.8 % CREA (TERCONAZOLE) insert 1 appl vaginally at bedtime x 3 d  #1 x 0   Entered and Authorized by:   Esperanza Sheets PA   Signed by:   Esperanza Sheets PA on 03/26/2009   Method used:   Electronically to        [facility] (retail)       [address]       [place], [place]  [address]       Ph: [phone] or [phone]       Fax: [phone]   RxID:   [id number]

## 2010-02-14 NOTE — Assessment & Plan Note (Signed)
Summary: vaginal irritation - room 1   Vital Signs:  Patient profile:   60 year old female Menstrual status:  hysterectomy Height:      64 inches Weight:      183.75 pounds BMI:     31.65 O2 Sat:      99 % on Room air Pulse rate:   85 / minute Resp:     16 per minute BP sitting:   130 / 80  (left arm)  Vitals Entered By: Adella Hare LPN (March 22, 2009 3:57 PM) CC: vaginal irritation Is Patient Diabetic? No Pain Assessment Patient in pain? no        Primary Provider:  Syliva Overman MD  CC:  vaginal irritation.  History of Present Illness: Pt is in today with c/o external genital irritation.  No vag discomfort or itching.  No dischg.  Did notice some  irritation with coitus last time.  Has probs with dryness intermittently. Menopausal x yrs.  No other complaints or concerns today.  Current Medications (verified): 1)  Vitamin D (Ergocalciferol) 50000 Unit Caps (Ergocalciferol) .... Take One Tab Every Week 2)  Ibuprofen 800 Mg Tabs (Ibuprofen) .... Take 1 Tablet By Mouth Three Times A Day , Start Today 3)  Cyclobenzaprine Hcl 10 Mg Tabs (Cyclobenzaprine Hcl) .... Take 1 Tab By Mouth At Bedtime As Needed  Allergies (verified): 1)  ! Codeine  Review of Systems GU:  Denies dysuria and urinary frequency.  Physical Exam  General:  Well-developed,well-nourished,in no acute distress; alert,appropriate and cooperative throughout examination Head:  Normocephalic and atraumatic without obvious abnormalities. No apparent alopecia or balding. Genitalia:  Mild external erythema noted.  No ulcers, etc.  Vag does have a small amt of white vag mucus, some small curdling. Cervix & uterus is absent.  Bilat adnexa neg.    Impression & Recommendations:  Problem # 1:  CANDIDIASIS OF VULVA AND VAGINA (ICD-112.1) Assessment New  Her updated medication list for this problem includes:    Fluconazole 150 Mg Tabs (Fluconazole) .Marland Kitchen... 1 by mouth for yeast infection  Complete  Medication List: 1)  Vitamin D (ergocalciferol) 50000 Unit Caps (Ergocalciferol) .... Take one tab every week 2)  Ibuprofen 800 Mg Tabs (Ibuprofen) .... Take 1 tablet by mouth three times a day , start today 3)  Cyclobenzaprine Hcl 10 Mg Tabs (Cyclobenzaprine hcl) .... Take 1 tab by mouth at bedtime as needed 4)  Fluconazole 150 Mg Tabs (Fluconazole) .Marland Kitchen.. 1 by mouth for yeast infection  Other Orders: T-Wet Prep by [facility] [id number])  Patient Instructions: 1)  Please schedule a follow-up appointment as needed. 2)   I have prescribed medication to the pharmacy for you. 3)  Use lubricants as needed. Prescriptions: FLUCONAZOLE 150 MG TABS (FLUCONAZOLE) 1 by mouth for yeast infection  #1 x 0   Entered and Authorized by:   Esperanza Sheets PA   Signed by:   Esperanza Sheets PA on 03/22/2009   Method used:   Electronically to        [facility] (retail)       [address]       [place], [place]  [address]       Ph: [phone] or [phone]       Fax: [phone]   RxID:   [id number]

## 2010-02-14 NOTE — Letter (Signed)
Summary: Out of Work  [facility]  [address]   [place], [place] [address]   Phone: [phone]  Fax: [phone]    February 21, 2009   Employee:  HETTY LINHART    To Whom It May Concern:   For Medical reasons, please excuse the above named employee from work for the following dates:  Start:   02/21/09  End:   02/26/09 to return with no restrictions  If you need additional information, please feel free to contact our office.         Sincerely,    Milus Mallick. Lodema Hong, MD

## 2010-02-14 NOTE — Letter (Signed)
Summary: MISC  MISC   Imported By: Lind Guest 08/29/2009 16:46:16  _____________________________________________________________________  External Attachment:    Type:   Image     Comment:   External Document

## 2010-02-14 NOTE — Letter (Signed)
Summary: HISTORY AND PHYSICAL  HISTORY AND PHYSICAL   Imported By: Lind Guest 08/29/2009 16:45:51  _____________________________________________________________________  External Attachment:    Type:   Image     Comment:   External Document

## 2010-02-14 NOTE — Letter (Signed)
Summary: [facility]   Imported By: Lind Guest 01/21/2010 16:40:18  _____________________________________________________________________  External Attachment:    Type:   Image     Comment:   External Document

## 2010-02-14 NOTE — Letter (Signed)
Summary: OFFICE NOTES  OFFICE NOTES   Imported By: Lind Guest 08/29/2009 16:43:50  _____________________________________________________________________  External Attachment:    Type:   Image     Comment:   External Document

## 2010-02-14 NOTE — Letter (Signed)
Summary: MED REVIEW SIGNED PAPER  MED REVIEW SIGNED PAPER   Imported By: Lind Guest 10/26/2009 09:55:28  _____________________________________________________________________  External Attachment:    Type:   Image     Comment:   External Document

## 2010-02-14 NOTE — Progress Notes (Signed)
Summary: note  Phone Note Call from Patient   Summary of Call: needs to get a note from doc to wear tenni shoes to work. can doc fax note to work are does she need to pick up. [phone] fax [phone] Initial call taken by: Rudene Anda,  April 24, 2009 2:23 PM  Follow-up for Phone Call        pls type note stating pt needs to wears shoes with good support  on medival grds,  and pls take this into consideration and permit her to wear tennis shoes opn the job,pls stamp then you can fax over and let her know Follow-up by: Syliva Overman MD,  April 24, 2009 5:24 PM  Additional Follow-up for Phone Call Additional follow up Details #1::        note faxed, called patient, no answer Additional Follow-up by: Everitt Amber LPN,  April 26, 2009 11:21 AM    Additional Follow-up for Phone Call Additional follow up Details #2::    called patient no answer. Will let pt know when she calls back Follow-up by: Everitt Amber LPN,  April 26, 2009 1:03 PM

## 2010-02-14 NOTE — Progress Notes (Signed)
Summary: referral for Mammogram   Phone Note Call from Patient   Summary of Call: patient called in and states she feels something in her right breast, she states that she has cyst in the past and she has had 2 surgeries.  Patient states that she is going to be in [place] on Dec. 27 between 1 and 2 if we could get it for that day at the [facility] [phone], Please call patient and advise Initial call taken by: Curtis Sites,  January 03, 2010 9:58 AM  Follow-up for Phone Call        she needs an OV before she can be referred for a mamo, p,ls let her know and schedule on next week Follow-up by: Syliva Overman MD,  January 03, 2010 12:09 PM  Additional Follow-up for Phone Call Additional follow up Details #1::        left msg for patient to return my call. Additional Follow-up by: Curtis Sites,  January 03, 2010 1:12 PM  New Problems: BREAST MASS, HX OF (ICD-V15.89)   Additional Follow-up for Phone Call Additional follow up Details #2::    her mamo is already opast due PLS do sched as a priority since pt reports a lump i will enter the order like that, pls go ahead and try to get it the times she requests if possible also Follow-up by: Syliva Overman MD,  January 04, 2010 8:12 AM  Additional Follow-up for Phone Call Additional follow up Details #3:: Details for Additional Follow-up Action Taken: [phone]. is her cell phone number she is going on Dec 29,2011 at 10:00.  Patient is aware of appt. Additional Follow-up by: Curtis Sites,  January 04, 2010 8:17 AM  New Problems: BREAST MASS, HX OF (ICD-V15.89)

## 2010-02-14 NOTE — Progress Notes (Signed)
Summary: Precert number on MRI  Phone Note From Other Clinic   Caller: Olegario Messier at [facility] Summary of Call: Olegario Messier called from [facility] and needed a precert on pt for MRI of breast. I called [facility] and got the autherzation number which is [phone]. Also called [facility] back left message for [facility] and precert number for the MRI. Told kathy to call office back to make sure she got my message. Also called pt to let her know I had her precert number for MRI.   Initial call taken by: Rudene Anda,  January 22, 2010 1:01 PM

## 2010-02-14 NOTE — Letter (Signed)
Summary: handicap card  handicap card   Imported By: Lind Guest 01/22/2009 12:52:32  _____________________________________________________________________  External Attachment:    Type:   Image     Comment:   External Document

## 2010-02-14 NOTE — Letter (Signed)
Summary: [facility]  [facility]   Imported By: Lind Guest 10/31/2009 17:14:04  _____________________________________________________________________  External Attachment:    Type:   Image     Comment:   External Document

## 2010-02-14 NOTE — Progress Notes (Signed)
Summary: call back  Phone Note Call from Patient   Summary of Call: pt called back and stated she would be home all day. please call her on cell phone [phone] Initial call taken by: Rudene Anda,  February 01, 2010 11:31 AM  Follow-up for Phone Call        call completed Follow-up by: Syliva Overman MD,  February 01, 2010 12:38 PM

## 2010-02-14 NOTE — Progress Notes (Signed)
Summary: call  Phone Note Call from Patient   Summary of Call: wants you  to call her at [phone] [phone] .left message Initial call taken by: Lind Guest,  October 25, 2009 11:35 AM  Follow-up for Phone Call        Patient had question about referral and transferred to [facility] to pickup Follow-up by: Everitt Amber LPN,  October 25, 2009 1:42 PM

## 2010-02-14 NOTE — Letter (Signed)
Summary: LABS  LABS   Imported By: Lind Guest 08/29/2009 16:44:17  _____________________________________________________________________  External Attachment:    Type:   Image     Comment:   External Document

## 2010-02-14 NOTE — Letter (Signed)
Summary: [facility]   Imported By: Lind Guest 11/09/2009 09:46:03  _____________________________________________________________________  External Attachment:    Type:   Image     Comment:   External Document

## 2010-02-14 NOTE — Progress Notes (Signed)
Summary: talk with doc  Phone Note Call from Patient   Summary of Call: pt would like to talk with Dr. Lodema Hong about having breast cancer. She has done her test and going to oncologist. [phone] Initial call taken by: Rudene Anda,  January 30, 2010 9:36 AM  Follow-up for Phone Call        pls call pt get tele # i can call her at between 12:15 and 12:45 today, I left her a msg last evening Follow-up by: Syliva Overman MD,  January 31, 2010 9:14 AM  Additional Follow-up for Phone Call Additional follow up Details #1::        chemo class at 12:30 in [facility] cell # is [phone] and I told her since she had class that you may even wait till after work she said please call her anytime she will be home later this evening she has another appt @ 4:00 she wants to talk to you about personal things  home # [phone] cell # [phone] Additional Follow-up by: Lind Guest,  January 31, 2010 11:15 AM    Additional Follow-up for Phone Call Additional follow up Details #2::    Pt states she will start chemo in FEb preop before her mastectomy, she is to start 6 courses every 3 weeks, and expects surgery in July questions about disability, short and long term and ability to work part time. Advised her to spk with HR, and to keep an open mind to part time work between treatments/reduced work. Mentioned permanenet disability, advise she would need to go thru social services, but wait on this, she understands , and will keep in touch. States she is feeling more comfortable with her dx and is holding fast to her faith  Follow-up by: Syliva Overman MD,  February 01, 2010 12:37 PM

## 2010-02-15 ENCOUNTER — Ambulatory Visit (HOSPITAL_COMMUNITY)
Admission: RE | Admit: 2010-02-15 | Discharge: 2010-02-15 | Disposition: A | Discharge status: Home / Self Care | Payer: BC Managed Care – PPO | Source: Ambulatory Visit | Attending: Orthopedic Surgery | Admitting: Orthopedic Surgery

## 2010-02-15 DIAGNOSIS — M6281 Muscle weakness (generalized): Secondary | ICD-10-CM | POA: Insufficient documentation

## 2010-02-15 DIAGNOSIS — IMO0001 Reserved for inherently not codable concepts without codable children: Secondary | ICD-10-CM | POA: Insufficient documentation

## 2010-02-15 DIAGNOSIS — M25669 Stiffness of unspecified knee, not elsewhere classified: Secondary | ICD-10-CM | POA: Insufficient documentation

## 2010-02-15 DIAGNOSIS — M25569 Pain in unspecified knee: Secondary | ICD-10-CM | POA: Insufficient documentation

## 2010-02-15 DIAGNOSIS — R262 Difficulty in walking, not elsewhere classified: Secondary | ICD-10-CM | POA: Insufficient documentation

## 2010-02-18 ENCOUNTER — Encounter (HOSPITAL_COMMUNITY)
Admission: RE | Admit: 2010-02-18 | Discharge: 2010-02-18 | Disposition: A | Discharge status: Home / Self Care | Payer: BC Managed Care – PPO | Source: Ambulatory Visit | Attending: Physical Therapy | Admitting: Physical Therapy

## 2010-02-19 ENCOUNTER — Encounter (HOSPITAL_BASED_OUTPATIENT_CLINIC_OR_DEPARTMENT_OTHER): Payer: BC Managed Care – PPO | Admitting: Oncology

## 2010-02-19 DIAGNOSIS — Z5111 Encounter for antineoplastic chemotherapy: Secondary | ICD-10-CM

## 2010-02-19 DIAGNOSIS — C50919 Malignant neoplasm of unspecified site of unspecified female breast: Secondary | ICD-10-CM

## 2010-02-20 ENCOUNTER — Encounter (HOSPITAL_BASED_OUTPATIENT_CLINIC_OR_DEPARTMENT_OTHER): Payer: BC Managed Care – PPO | Admitting: Oncology

## 2010-02-20 ENCOUNTER — Encounter (HOSPITAL_COMMUNITY)
Admission: RE | Admit: 2010-02-20 | Discharge: 2010-02-20 | Disposition: A | Discharge status: Home / Self Care | Payer: BC Managed Care – PPO | Source: Ambulatory Visit | Attending: Physical Therapy | Admitting: Physical Therapy

## 2010-02-20 DIAGNOSIS — Z5189 Encounter for other specified aftercare: Secondary | ICD-10-CM

## 2010-02-20 DIAGNOSIS — C50919 Malignant neoplasm of unspecified site of unspecified female breast: Secondary | ICD-10-CM

## 2010-02-22 ENCOUNTER — Encounter (HOSPITAL_COMMUNITY): Payer: BC Managed Care – PPO | Admitting: Physical Therapy

## 2010-02-25 ENCOUNTER — Ambulatory Visit (HOSPITAL_COMMUNITY): Payer: BC Managed Care – PPO | Admitting: Physical Therapy

## 2010-02-26 ENCOUNTER — Other Ambulatory Visit: Payer: Self-pay | Admitting: Oncology

## 2010-02-26 ENCOUNTER — Encounter (HOSPITAL_BASED_OUTPATIENT_CLINIC_OR_DEPARTMENT_OTHER): Payer: BC Managed Care – PPO | Admitting: Oncology

## 2010-02-26 DIAGNOSIS — R112 Nausea with vomiting, unspecified: Secondary | ICD-10-CM

## 2010-02-26 DIAGNOSIS — C50919 Malignant neoplasm of unspecified site of unspecified female breast: Secondary | ICD-10-CM

## 2010-02-26 LAB — CBC WITH DIFFERENTIAL/PLATELET
Basophils Absolute: 0 10*3/uL (ref 0.0–0.1)
EOS%: 0.7 % (ref 0.0–7.0)
Eosinophils Absolute: 0 10*3/uL (ref 0.0–0.5)
HCT: 38.7 % (ref 34.8–46.6)
HGB: 12.5 g/dL (ref 11.6–15.9)
LYMPH%: 21 % (ref 14.0–49.7)
MCH: 26.3 pg (ref 25.1–34.0)
MCV: 81.7 fL (ref 79.5–101.0)
MONO%: 7.6 % (ref 0.0–14.0)
NEUT#: 3.5 10*3/uL (ref 1.5–6.5)
NEUT%: 70.2 % (ref 38.4–76.8)
Platelets: 182 10*3/uL (ref 145–400)
RDW: 15.4 % — ABNORMAL HIGH (ref 11.2–14.5)

## 2010-02-27 ENCOUNTER — Ambulatory Visit (HOSPITAL_COMMUNITY)
Admission: RE | Admit: 2010-02-27 | Discharge: 2010-02-27 | Disposition: A | Discharge status: Home / Self Care | Payer: BC Managed Care – PPO | Source: Ambulatory Visit | Attending: Orthopedic Surgery | Admitting: Orthopedic Surgery

## 2010-02-28 NOTE — Letter (Signed)
Summary: hematology  oncology  hematology  oncology   Imported By: Lind Guest 02/22/2010 10:48:32  _____________________________________________________________________  External Attachment:    Type:   Image     Comment:   External Document

## 2010-03-01 ENCOUNTER — Ambulatory Visit (HOSPITAL_COMMUNITY)
Admission: RE | Admit: 2010-03-01 | Discharge: 2010-03-01 | Disposition: A | Discharge status: Home / Self Care | Payer: BC Managed Care – PPO | Source: Ambulatory Visit

## 2010-03-12 ENCOUNTER — Encounter (HOSPITAL_BASED_OUTPATIENT_CLINIC_OR_DEPARTMENT_OTHER): Payer: BC Managed Care – PPO | Admitting: Oncology

## 2010-03-12 ENCOUNTER — Other Ambulatory Visit: Payer: Self-pay | Admitting: Oncology

## 2010-03-12 ENCOUNTER — Encounter: Payer: Self-pay | Admitting: Family Medicine

## 2010-03-12 DIAGNOSIS — Z5111 Encounter for antineoplastic chemotherapy: Secondary | ICD-10-CM

## 2010-03-12 DIAGNOSIS — C50919 Malignant neoplasm of unspecified site of unspecified female breast: Secondary | ICD-10-CM

## 2010-03-12 LAB — CBC WITH DIFFERENTIAL/PLATELET
EOS%: 0 % (ref 0.0–7.0)
Eosinophils Absolute: 0 10*3/uL (ref 0.0–0.5)
LYMPH%: 7.3 % — ABNORMAL LOW (ref 14.0–49.7)
MCH: 25.5 pg (ref 25.1–34.0)
MCV: 80.6 fL (ref 79.5–101.0)
MONO%: 5 % (ref 0.0–14.0)
NEUT#: 12.7 10*3/uL — ABNORMAL HIGH (ref 1.5–6.5)
Platelets: 366 10*3/uL (ref 145–400)
RBC: 4.39 10*6/uL (ref 3.70–5.45)
RDW: 16.7 % — ABNORMAL HIGH (ref 11.2–14.5)
nRBC: 0 % (ref 0–0)

## 2010-03-13 ENCOUNTER — Encounter (HOSPITAL_BASED_OUTPATIENT_CLINIC_OR_DEPARTMENT_OTHER): Payer: BC Managed Care – PPO | Admitting: Oncology

## 2010-03-13 DIAGNOSIS — C50919 Malignant neoplasm of unspecified site of unspecified female breast: Secondary | ICD-10-CM

## 2010-03-13 DIAGNOSIS — Z5189 Encounter for other specified aftercare: Secondary | ICD-10-CM

## 2010-03-20 ENCOUNTER — Other Ambulatory Visit: Payer: Self-pay | Admitting: Oncology

## 2010-03-20 ENCOUNTER — Encounter (HOSPITAL_BASED_OUTPATIENT_CLINIC_OR_DEPARTMENT_OTHER): Payer: BC Managed Care – PPO | Admitting: Oncology

## 2010-03-20 DIAGNOSIS — C50919 Malignant neoplasm of unspecified site of unspecified female breast: Secondary | ICD-10-CM

## 2010-03-20 LAB — CBC WITH DIFFERENTIAL/PLATELET
BASO%: 0 % (ref 0.0–2.0)
EOS%: 0.2 % (ref 0.0–7.0)
HCT: 34.9 % (ref 34.8–46.6)
LYMPH%: 7.1 % — ABNORMAL LOW (ref 14.0–49.7)
MCH: 26 pg (ref 25.1–34.0)
MCHC: 31.9 g/dL (ref 31.5–36.0)
NEUT%: 90.3 % — ABNORMAL HIGH (ref 38.4–76.8)
Platelets: 202 10*3/uL (ref 145–400)
RBC: 4.3 10*6/uL (ref 3.70–5.45)

## 2010-03-21 NOTE — Letter (Signed)
Summary: [facility]  [facility]   Imported By: Lind Guest 03/12/2010 09:14:44  _____________________________________________________________________  External Attachment:    Type:   Image     Comment:   External Document

## 2010-03-26 LAB — BASIC METABOLIC PANEL
BUN: 4 mg/dL — ABNORMAL LOW (ref 6–23)
BUN: 6 mg/dL (ref 6–23)
BUN: 7 mg/dL (ref 6–23)
CO2: 26 mEq/L (ref 19–32)
CO2: 27 mEq/L (ref 19–32)
Calcium: 8.5 mg/dL (ref 8.4–10.5)
Chloride: 108 mEq/L (ref 96–112)
Creatinine, Ser: 0.79 mg/dL (ref 0.4–1.2)
Creatinine, Ser: 0.85 mg/dL (ref 0.4–1.2)
GFR calc non Af Amer: >60 mL/min (ref >60)
Glucose, Bld: 82 mg/dL (ref 70–99)
Glucose, Bld: 88 mg/dL (ref 70–99)
Glucose, Bld: 89 mg/dL (ref 70–99)
Potassium: 4 mEq/L (ref 3.5–5.1)
Sodium: 141 mEq/L (ref 135–145)

## 2010-03-26 LAB — CBC
HCT: 31.2 % — ABNORMAL LOW (ref 36.0–46.0)
Hemoglobin: 9 g/dL — ABNORMAL LOW (ref 12.0–15.0)
MCH: 26.4 pg (ref 26.0–34.0)
MCH: 26.5 pg (ref 26.0–34.0)
MCHC: 31.7 g/dL (ref 30.0–36.0)
MCHC: 31.9 g/dL (ref 30.0–36.0)
MCHC: 32 g/dL (ref 30.0–36.0)
MCV: 82.8 fL (ref 78.0–100.0)
MCV: 83.2 fL (ref 78.0–100.0)
Platelets: 153 10*3/uL (ref 150–400)
Platelets: 208 10*3/uL (ref 150–400)
RDW: 15.5 % (ref 11.5–15.5)
RDW: 15.5 % (ref 11.5–15.5)
RDW: 15.8 % — ABNORMAL HIGH (ref 11.5–15.5)
WBC: 7.8 10*3/uL (ref 4.0–10.5)

## 2010-03-26 LAB — COMPREHENSIVE METABOLIC PANEL
ALT: 13 U/L (ref 0–35)
AST: 24 U/L (ref 0–37)
Albumin: 4.1 g/dL (ref 3.5–5.2)
Calcium: 9.5 mg/dL (ref 8.4–10.5)
GFR calc Af Amer: >60 mL/min (ref >60)
Sodium: 141 mEq/L (ref 135–145)
Total Protein: 6.9 g/dL (ref 6.0–8.3)

## 2010-03-26 LAB — URINALYSIS, ROUTINE W REFLEX MICROSCOPIC
Bilirubin Urine: NEGATIVE
Nitrite: NEGATIVE
Specific Gravity, Urine: 1.021 (ref 1.005–1.030)
Urobilinogen, UA: 0.2 mg/dL (ref 0.0–1.0)

## 2010-03-26 LAB — SURGICAL PCR SCREEN
MRSA, PCR: NEGATIVE
Staphylococcus aureus: NEGATIVE

## 2010-03-26 LAB — PROTIME-INR
INR: 1.17 (ref 0.00–1.49)
Prothrombin Time: 13.6 seconds (ref 11.6–15.2)
Prothrombin Time: 22.6 seconds — ABNORMAL HIGH (ref 11.6–15.2)

## 2010-03-26 LAB — TYPE AND SCREEN: Antibody Screen: NEGATIVE

## 2010-03-26 NOTE — Letter (Signed)
Summary: hematology/ oncology  hematology/ oncology   Imported By: Lind Guest 03/22/2010 14:40:02  _____________________________________________________________________  External Attachment:    Type:   Image     Comment:   External Document

## 2010-04-02 ENCOUNTER — Other Ambulatory Visit: Payer: Self-pay | Admitting: Oncology

## 2010-04-02 ENCOUNTER — Encounter (HOSPITAL_BASED_OUTPATIENT_CLINIC_OR_DEPARTMENT_OTHER): Payer: BC Managed Care – PPO | Admitting: Oncology

## 2010-04-02 DIAGNOSIS — Z5111 Encounter for antineoplastic chemotherapy: Secondary | ICD-10-CM

## 2010-04-02 DIAGNOSIS — C50919 Malignant neoplasm of unspecified site of unspecified female breast: Secondary | ICD-10-CM

## 2010-04-02 LAB — CBC WITH DIFFERENTIAL/PLATELET
BASO%: 0 % (ref 0.0–2.0)
Eosinophils Absolute: 0 10*3/uL (ref 0.0–0.5)
HCT: 33.8 % — ABNORMAL LOW (ref 34.8–46.6)
LYMPH%: 4.7 % — ABNORMAL LOW (ref 14.0–49.7)
MCHC: 31.7 g/dL (ref 31.5–36.0)
MONO#: 1 10*3/uL — ABNORMAL HIGH (ref 0.1–0.9)
NEUT%: 90.6 % — ABNORMAL HIGH (ref 38.4–76.8)
Platelets: 337 10*3/uL (ref 145–400)
WBC: 21.1 10*3/uL — ABNORMAL HIGH (ref 3.9–10.3)

## 2010-04-03 ENCOUNTER — Other Ambulatory Visit: Payer: Self-pay | Admitting: Oncology

## 2010-04-03 ENCOUNTER — Encounter (HOSPITAL_BASED_OUTPATIENT_CLINIC_OR_DEPARTMENT_OTHER): Payer: BC Managed Care – PPO | Admitting: Oncology

## 2010-04-03 DIAGNOSIS — C50911 Malignant neoplasm of unspecified site of right female breast: Secondary | ICD-10-CM

## 2010-04-03 DIAGNOSIS — C50919 Malignant neoplasm of unspecified site of unspecified female breast: Secondary | ICD-10-CM

## 2010-04-03 DIAGNOSIS — Z5189 Encounter for other specified aftercare: Secondary | ICD-10-CM

## 2010-04-09 ENCOUNTER — Other Ambulatory Visit: Payer: Self-pay | Admitting: Oncology

## 2010-04-09 ENCOUNTER — Encounter (HOSPITAL_BASED_OUTPATIENT_CLINIC_OR_DEPARTMENT_OTHER): Payer: BC Managed Care – PPO | Admitting: Oncology

## 2010-04-09 DIAGNOSIS — R112 Nausea with vomiting, unspecified: Secondary | ICD-10-CM

## 2010-04-09 DIAGNOSIS — Z5189 Encounter for other specified aftercare: Secondary | ICD-10-CM

## 2010-04-09 DIAGNOSIS — C50919 Malignant neoplasm of unspecified site of unspecified female breast: Secondary | ICD-10-CM

## 2010-04-09 LAB — CBC WITH DIFFERENTIAL/PLATELET
BASO%: 1.8 % (ref 0.0–2.0)
EOS%: 0.7 % (ref 0.0–7.0)
HCT: 31.2 % — ABNORMAL LOW (ref 34.8–46.6)
LYMPH%: 26.3 % (ref 14.0–49.7)
MCH: 25.6 pg (ref 25.1–34.0)
MCHC: 32.1 g/dL (ref 31.5–36.0)
MCV: 79.8 fL (ref 79.5–101.0)
NEUT%: 46 % (ref 38.4–76.8)
Platelets: 188 10*3/uL (ref 145–400)

## 2010-04-23 ENCOUNTER — Other Ambulatory Visit: Payer: Self-pay | Admitting: Oncology

## 2010-04-23 ENCOUNTER — Encounter (HOSPITAL_BASED_OUTPATIENT_CLINIC_OR_DEPARTMENT_OTHER): Payer: BC Managed Care – PPO | Admitting: Oncology

## 2010-04-23 DIAGNOSIS — C50919 Malignant neoplasm of unspecified site of unspecified female breast: Secondary | ICD-10-CM

## 2010-04-23 DIAGNOSIS — Z5111 Encounter for antineoplastic chemotherapy: Secondary | ICD-10-CM

## 2010-04-23 DIAGNOSIS — N63 Unspecified lump in unspecified breast: Secondary | ICD-10-CM

## 2010-04-23 DIAGNOSIS — R112 Nausea with vomiting, unspecified: Secondary | ICD-10-CM

## 2010-04-23 DIAGNOSIS — Z5189 Encounter for other specified aftercare: Secondary | ICD-10-CM

## 2010-04-23 LAB — CBC WITH DIFFERENTIAL/PLATELET
Basophils Absolute: 0 10*3/uL (ref 0.0–0.1)
EOS%: 0 % (ref 0.0–7.0)
MCH: 26.6 pg (ref 25.1–34.0)
MCV: 82.2 fL (ref 79.5–101.0)
MONO%: 2.6 % (ref 0.0–14.0)
RBC: 3.86 10*6/uL (ref 3.70–5.45)
RDW: 20.5 % — ABNORMAL HIGH (ref 11.2–14.5)

## 2010-04-23 LAB — COMPREHENSIVE METABOLIC PANEL
AST: 37 U/L (ref 0–37)
Albumin: 3.3 g/dL — ABNORMAL LOW (ref 3.5–5.2)
Alkaline Phosphatase: 98 U/L (ref 39–117)
BUN: 7 mg/dL (ref 6–23)
Potassium: 3.4 mEq/L — ABNORMAL LOW (ref 3.5–5.3)
Sodium: 140 mEq/L (ref 135–145)
Total Bilirubin: 0.4 mg/dL (ref 0.3–1.2)

## 2010-04-24 ENCOUNTER — Encounter (HOSPITAL_BASED_OUTPATIENT_CLINIC_OR_DEPARTMENT_OTHER): Payer: BC Managed Care – PPO | Admitting: Oncology

## 2010-04-24 DIAGNOSIS — Z5189 Encounter for other specified aftercare: Secondary | ICD-10-CM

## 2010-04-24 DIAGNOSIS — C50919 Malignant neoplasm of unspecified site of unspecified female breast: Secondary | ICD-10-CM

## 2010-04-29 ENCOUNTER — Ambulatory Visit
Admission: RE | Admit: 2010-04-29 | Discharge: 2010-04-29 | Disposition: A | Discharge status: Home / Self Care | Payer: BC Managed Care – PPO | Source: Ambulatory Visit | Attending: Oncology | Admitting: Oncology

## 2010-04-29 DIAGNOSIS — C50911 Malignant neoplasm of unspecified site of right female breast: Secondary | ICD-10-CM

## 2010-04-29 MED ORDER — GADOBENATE DIMEGLUMINE 529 MG/ML IV SOLN
14.0000 mL | Freq: Once | INTRAVENOUS | Status: AC | PRN
  Administered 2010-04-29: 14 mL via INTRAVENOUS

## 2010-05-01 ENCOUNTER — Encounter (HOSPITAL_BASED_OUTPATIENT_CLINIC_OR_DEPARTMENT_OTHER): Payer: BC Managed Care – PPO | Admitting: Oncology

## 2010-05-01 ENCOUNTER — Other Ambulatory Visit: Payer: Self-pay | Admitting: Oncology

## 2010-05-01 DIAGNOSIS — R112 Nausea with vomiting, unspecified: Secondary | ICD-10-CM

## 2010-05-01 DIAGNOSIS — Z5111 Encounter for antineoplastic chemotherapy: Secondary | ICD-10-CM

## 2010-05-01 DIAGNOSIS — C50919 Malignant neoplasm of unspecified site of unspecified female breast: Secondary | ICD-10-CM

## 2010-05-01 DIAGNOSIS — Z5189 Encounter for other specified aftercare: Secondary | ICD-10-CM

## 2010-05-01 LAB — CBC WITH DIFFERENTIAL/PLATELET
Basophils Absolute: 0 10*3/uL (ref 0.0–0.1)
EOS%: 0.2 % (ref 0.0–7.0)
HGB: 9 g/dL — ABNORMAL LOW (ref 11.6–15.9)
LYMPH%: 11.5 % — ABNORMAL LOW (ref 14.0–49.7)
MCH: 25.2 pg (ref 25.1–34.0)
MCV: 80.4 fL (ref 79.5–101.0)
MONO%: 11.9 % (ref 0.0–14.0)
NEUT%: 76.1 % (ref 38.4–76.8)
Platelets: 190 10*3/uL (ref 145–400)
RDW: 18.3 % — ABNORMAL HIGH (ref 11.2–14.5)

## 2010-05-08 ENCOUNTER — Ambulatory Visit (HOSPITAL_COMMUNITY)
Admission: RE | Admit: 2010-05-08 | Discharge: 2010-05-08 | Disposition: A | Discharge status: Home / Self Care | Payer: BC Managed Care – PPO | Source: Ambulatory Visit | Attending: Oncology | Admitting: Oncology

## 2010-05-08 DIAGNOSIS — I319 Disease of pericardium, unspecified: Secondary | ICD-10-CM

## 2010-05-14 ENCOUNTER — Encounter (HOSPITAL_BASED_OUTPATIENT_CLINIC_OR_DEPARTMENT_OTHER): Payer: BC Managed Care – PPO | Admitting: Oncology

## 2010-05-14 ENCOUNTER — Other Ambulatory Visit: Payer: Self-pay | Admitting: Oncology

## 2010-05-14 DIAGNOSIS — C50519 Malignant neoplasm of lower-outer quadrant of unspecified female breast: Secondary | ICD-10-CM

## 2010-05-14 DIAGNOSIS — G620 Drug-induced polyneuropathy: Secondary | ICD-10-CM

## 2010-05-14 DIAGNOSIS — C50919 Malignant neoplasm of unspecified site of unspecified female breast: Secondary | ICD-10-CM

## 2010-05-14 DIAGNOSIS — Z5189 Encounter for other specified aftercare: Secondary | ICD-10-CM

## 2010-05-14 DIAGNOSIS — R112 Nausea with vomiting, unspecified: Secondary | ICD-10-CM

## 2010-05-14 DIAGNOSIS — R110 Nausea: Secondary | ICD-10-CM

## 2010-05-14 DIAGNOSIS — Z5111 Encounter for antineoplastic chemotherapy: Secondary | ICD-10-CM

## 2010-05-14 LAB — CBC WITH DIFFERENTIAL/PLATELET
BASO%: 0.3 % (ref 0.0–2.0)
EOS%: 0.2 % (ref 0.0–7.0)
HCT: 29.1 % — ABNORMAL LOW (ref 34.8–46.6)
LYMPH%: 14.3 % (ref 14.0–49.7)
MCH: 25.5 pg (ref 25.1–34.0)
MCHC: 32 g/dL (ref 31.5–36.0)
MCV: 79.9 fL (ref 79.5–101.0)
MONO%: 13.7 % (ref 0.0–14.0)
NEUT%: 71.5 % (ref 38.4–76.8)
Platelets: 497 10*3/uL — ABNORMAL HIGH (ref 145–400)
RBC: 3.64 10*6/uL — ABNORMAL LOW (ref 3.70–5.45)
WBC: 9.4 10*3/uL (ref 3.9–10.3)

## 2010-05-15 ENCOUNTER — Encounter (HOSPITAL_BASED_OUTPATIENT_CLINIC_OR_DEPARTMENT_OTHER): Payer: BC Managed Care – PPO | Admitting: Oncology

## 2010-05-15 DIAGNOSIS — C50919 Malignant neoplasm of unspecified site of unspecified female breast: Secondary | ICD-10-CM

## 2010-05-15 DIAGNOSIS — R634 Abnormal weight loss: Secondary | ICD-10-CM

## 2010-05-15 DIAGNOSIS — R5381 Other malaise: Secondary | ICD-10-CM

## 2010-05-15 DIAGNOSIS — Z5189 Encounter for other specified aftercare: Secondary | ICD-10-CM

## 2010-05-15 DIAGNOSIS — R5383 Other fatigue: Secondary | ICD-10-CM

## 2010-05-15 LAB — COMPREHENSIVE METABOLIC PANEL
Alkaline Phosphatase: 112 U/L (ref 39–117)
Creatinine, Ser: 0.53 mg/dL (ref 0.40–1.20)
Sodium: 140 mEq/L (ref 135–145)
Total Bilirubin: 0.4 mg/dL (ref 0.3–1.2)
Total Protein: 6.6 g/dL (ref 6.0–8.3)

## 2010-05-21 ENCOUNTER — Other Ambulatory Visit: Payer: Self-pay | Admitting: Oncology

## 2010-05-21 ENCOUNTER — Encounter (HOSPITAL_BASED_OUTPATIENT_CLINIC_OR_DEPARTMENT_OTHER): Payer: BC Managed Care – PPO | Admitting: Oncology

## 2010-05-21 DIAGNOSIS — R112 Nausea with vomiting, unspecified: Secondary | ICD-10-CM

## 2010-05-21 DIAGNOSIS — R5383 Other fatigue: Secondary | ICD-10-CM

## 2010-05-21 DIAGNOSIS — C50919 Malignant neoplasm of unspecified site of unspecified female breast: Secondary | ICD-10-CM

## 2010-05-21 DIAGNOSIS — Z5189 Encounter for other specified aftercare: Secondary | ICD-10-CM

## 2010-05-21 DIAGNOSIS — Z5111 Encounter for antineoplastic chemotherapy: Secondary | ICD-10-CM

## 2010-05-21 DIAGNOSIS — R634 Abnormal weight loss: Secondary | ICD-10-CM

## 2010-05-21 LAB — CBC WITH DIFFERENTIAL/PLATELET
Eosinophils Absolute: 0 10*3/uL (ref 0.0–0.5)
HCT: 26.1 % — ABNORMAL LOW (ref 34.8–46.6)
LYMPH%: 47 % (ref 14.0–49.7)
MCHC: 32.4 g/dL (ref 31.5–36.0)
MCV: 81.6 fL (ref 79.5–101.0)
MONO#: 0 10*3/uL — ABNORMAL LOW (ref 0.1–0.9)
MONO%: 0.6 % (ref 0.0–14.0)
NEUT#: 0.3 10*3/uL — CL (ref 1.5–6.5)
NEUT%: 49.6 % (ref 38.4–76.8)
Platelets: 128 10*3/uL — ABNORMAL LOW (ref 145–400)
RBC: 3.2 10*6/uL — ABNORMAL LOW (ref 3.70–5.45)
WBC: 0.6 10*3/uL — CL (ref 3.9–10.3)

## 2010-05-21 LAB — COMPREHENSIVE METABOLIC PANEL
Alkaline Phosphatase: 94 U/L (ref 39–117)
CO2: 29 mEq/L (ref 19–32)
Creatinine, Ser: 0.51 mg/dL (ref 0.40–1.20)
Glucose, Bld: 110 mg/dL — ABNORMAL HIGH (ref 70–99)
Sodium: 137 mEq/L (ref 135–145)
Total Bilirubin: 0.5 mg/dL (ref 0.3–1.2)

## 2010-05-24 ENCOUNTER — Inpatient Hospital Stay (HOSPITAL_COMMUNITY)
Admission: AD | Admit: 2010-05-24 | Discharge: 2010-05-27 | DRG: 395 | Disposition: A | Discharge status: Home / Self Care | Payer: BC Managed Care – PPO | Source: Ambulatory Visit | Attending: Oncology | Admitting: Oncology

## 2010-05-24 ENCOUNTER — Encounter: Payer: BC Managed Care – PPO | Admitting: Oncology

## 2010-05-24 DIAGNOSIS — C50919 Malignant neoplasm of unspecified site of unspecified female breast: Secondary | ICD-10-CM | POA: Diagnosis present

## 2010-05-24 DIAGNOSIS — E46 Unspecified protein-calorie malnutrition: Secondary | ICD-10-CM | POA: Diagnosis present

## 2010-05-24 DIAGNOSIS — E860 Dehydration: Secondary | ICD-10-CM | POA: Diagnosis present

## 2010-05-24 DIAGNOSIS — K219 Gastro-esophageal reflux disease without esophagitis: Secondary | ICD-10-CM | POA: Diagnosis present

## 2010-05-24 DIAGNOSIS — Z96659 Presence of unspecified artificial knee joint: Secondary | ICD-10-CM

## 2010-05-24 DIAGNOSIS — T451X5A Adverse effect of antineoplastic and immunosuppressive drugs, initial encounter: Principal | ICD-10-CM | POA: Diagnosis present

## 2010-05-24 DIAGNOSIS — K5900 Constipation, unspecified: Secondary | ICD-10-CM | POA: Diagnosis not present

## 2010-05-24 DIAGNOSIS — D61810 Antineoplastic chemotherapy induced pancytopenia: Principal | ICD-10-CM | POA: Diagnosis present

## 2010-05-24 DIAGNOSIS — R5381 Other malaise: Secondary | ICD-10-CM | POA: Diagnosis present

## 2010-05-24 DIAGNOSIS — I951 Orthostatic hypotension: Secondary | ICD-10-CM | POA: Diagnosis present

## 2010-05-24 DIAGNOSIS — R42 Dizziness and giddiness: Secondary | ICD-10-CM | POA: Diagnosis present

## 2010-05-24 DIAGNOSIS — E876 Hypokalemia: Secondary | ICD-10-CM | POA: Diagnosis present

## 2010-05-24 DIAGNOSIS — R110 Nausea: Secondary | ICD-10-CM | POA: Diagnosis present

## 2010-05-24 DIAGNOSIS — Z9221 Personal history of antineoplastic chemotherapy: Secondary | ICD-10-CM

## 2010-05-24 LAB — DIFFERENTIAL
Basophils Absolute: 0 10*3/uL (ref 0.0–0.1)
Eosinophils Absolute: 0 10*3/uL (ref 0.0–0.7)
Lymphocytes Relative: 17 % (ref 12–46)
Monocytes Relative: 13 % — ABNORMAL HIGH (ref 3–12)
Neutrophils Relative %: 69 % (ref 43–77)

## 2010-05-24 LAB — URINALYSIS, ROUTINE W REFLEX MICROSCOPIC
Nitrite: NEGATIVE
Specific Gravity, Urine: 1.008 (ref 1.005–1.030)
Urobilinogen, UA: 0.2 mg/dL (ref 0.0–1.0)
pH: 6 (ref 5.0–8.0)

## 2010-05-24 LAB — COMPREHENSIVE METABOLIC PANEL
ALT: 6 U/L (ref 0–35)
AST: 13 U/L (ref 0–37)
Alkaline Phosphatase: 99 U/L (ref 39–117)
CO2: 25 mEq/L (ref 19–32)
Calcium: 8.7 mg/dL (ref 8.4–10.5)
Chloride: 101 mEq/L (ref 96–112)
GFR calc Af Amer: >60 mL/min (ref >60)
GFR calc non Af Amer: >60 mL/min (ref >60)
Potassium: 3.7 mEq/L (ref 3.5–5.1)
Sodium: 135 mEq/L (ref 135–145)

## 2010-05-24 LAB — CBC
HCT: 25.1 % — ABNORMAL LOW (ref 36.0–46.0)
Hemoglobin: 8 g/dL — ABNORMAL LOW (ref 12.0–15.0)
MCHC: 31.9 g/dL (ref 30.0–36.0)
RBC: 3.11 MIL/uL — ABNORMAL LOW (ref 3.87–5.11)
WBC: 2 10*3/uL — ABNORMAL LOW (ref 4.0–10.5)

## 2010-05-24 LAB — ABO/RH: ABO/RH(D): A POS

## 2010-05-25 DIAGNOSIS — C50919 Malignant neoplasm of unspecified site of unspecified female breast: Secondary | ICD-10-CM

## 2010-05-25 LAB — DIFFERENTIAL
Basophils Absolute: 0 10*3/uL (ref 0.0–0.1)
Eosinophils Absolute: 0 10*3/uL (ref 0.0–0.7)
Monocytes Absolute: 0.3 10*3/uL (ref 0.1–1.0)
Neutrophils Relative %: 67 % (ref 43–77)

## 2010-05-25 LAB — CBC
Platelets: 74 10*3/uL — ABNORMAL LOW (ref 150–400)
RDW: 17.1 % — ABNORMAL HIGH (ref 11.5–15.5)
WBC: 2.9 10*3/uL — ABNORMAL LOW (ref 4.0–10.5)

## 2010-05-26 DIAGNOSIS — D72819 Decreased white blood cell count, unspecified: Secondary | ICD-10-CM

## 2010-05-26 DIAGNOSIS — D649 Anemia, unspecified: Secondary | ICD-10-CM

## 2010-05-26 DIAGNOSIS — D696 Thrombocytopenia, unspecified: Secondary | ICD-10-CM

## 2010-05-26 DIAGNOSIS — E860 Dehydration: Secondary | ICD-10-CM

## 2010-05-26 LAB — DIFFERENTIAL
Basophils Absolute: 0 10*3/uL (ref 0.0–0.1)
Eosinophils Absolute: 0 10*3/uL (ref 0.0–0.7)
Lymphocytes Relative: 16 % (ref 12–46)
Neutro Abs: 3.9 10*3/uL (ref 1.7–7.7)
Neutrophils Relative %: 73 % (ref 43–77)

## 2010-05-26 LAB — CBC
MCH: 25.6 pg — ABNORMAL LOW (ref 26.0–34.0)
Platelets: 104 10*3/uL — ABNORMAL LOW (ref 150–400)
RBC: 2.73 MIL/uL — ABNORMAL LOW (ref 3.87–5.11)
WBC: 5.3 10*3/uL (ref 4.0–10.5)

## 2010-05-26 LAB — BASIC METABOLIC PANEL
Chloride: 107 mEq/L (ref 96–112)
Creatinine, Ser: 0.55 mg/dL (ref 0.4–1.2)
GFR calc Af Amer: >60 mL/min (ref >60)

## 2010-05-26 LAB — URINE CULTURE
Culture: NO GROWTH
Special Requests: NEGATIVE

## 2010-05-27 DIAGNOSIS — C50919 Malignant neoplasm of unspecified site of unspecified female breast: Secondary | ICD-10-CM

## 2010-05-27 LAB — CBC
HCT: 30.3 % — ABNORMAL LOW (ref 36.0–46.0)
Hemoglobin: 9.5 g/dL — ABNORMAL LOW (ref 12.0–15.0)
MCH: 26.2 pg (ref 26.0–34.0)
MCHC: 31.4 g/dL (ref 30.0–36.0)
MCV: 83.5 fL (ref 78.0–100.0)

## 2010-05-27 LAB — DIFFERENTIAL
Lymphocytes Relative: 19 % (ref 12–46)
Lymphs Abs: 1 10*3/uL (ref 0.7–4.0)
Monocytes Absolute: 0.7 10*3/uL (ref 0.1–1.0)
Monocytes Relative: 13 % — ABNORMAL HIGH (ref 3–12)
Neutro Abs: 3.8 10*3/uL (ref 1.7–7.7)

## 2010-05-27 LAB — TYPE AND SCREEN

## 2010-05-27 NOTE — Discharge Summary (Signed)
Gina Nelson, Gina Nelson               ACCOUNT NO.:  [id number]  MEDICAL RECORD NO.:  [id number]           PATIENT TYPE:  I  LOCATION:  1342                         FACILITY:  [facility]  PHYSICIAN:  Lowella Dell, M.D.DATE OF BIRTH:  1950-05-11  DATE OF ADMISSION:  05/24/2010 DATE OF DISCHARGE:                              DISCHARGE SUMMARY   DISCHARGE DIAGNOSES: 1. Severe anemia requiring transfusion. 2. Severe dehydration requiring intravenous fluids. 3. Poorly controlled nausea. 4. Leukopenia. 5. Thrombocytopenia. 6. Irritable bowel syndrome with persistent constipation. 7. Hypotension. 8. Malnutrition. 9. History of right total-knee replacement. 10.History of gastroesophageal reflux disease. 11.History of a simple hysterectomy. 12.History of bladder sling procedure. 13.History of laparoscopic cholecystectomy. 14.Hypokalemia.  PROCEDURES:  Transfusion of blood products, intravenous antibiotics, intravenous hydration.  HOSPITAL COURSE:  The patient was admitted on May 11 with orthostasis, severe dehydration, and pancytopenia secondary to chemotherapy.  She was aggressively hydrated and she was covered with intravenous antibiotics because of the leukopenia and the orthostasis being suggestive of sepsis syndrome.  Cultures, however, have remained negative through the admission and the patient did not develop a fever.  Despite aggressive fluid hydration, she remained orthostatic and her hemoglobin dropped to 7.0, at which point she was transfused two units of packed red cells.  She tolerated the transfusion without event.  At the time of this dictation, the patient is more ambulatory, denies dizziness or orthostasis.  She is still moderately hydrated and is very sensitive to various smells which can trigger nausea.  She is eating better, however, and has an improved sense of taste.  She is able now to keep herself hydrated by mouth.  PHYSICAL EXAMINATION:  VITAL SIGNS:   Her vital signs at the time of this dictation show a temperature of 98.1, pulse 67, respiratory rate 18 and blood pressure 95/60.  Her room air saturation is 100%. HEENT:  Oropharynx shows no thrush. LUNGS:  Clear to auscultation and percussion. HEART:  Regular rate and rhythm. ABDOMEN:  Soft and nontender.  Bowel sounds are positive.  No organomegaly. EXTREMITIES:  There is no peripheral edema. NEUROLOGIC:  Examination is nonfocal.  LABORATORY DATA:  Laboratory work at the time of this dictation shows the white cell count now to be 5.5 with neutrophils 3.8.  The hemoglobin is up to 9.5 after transfusion.  The platelet count is 124,000.  As stated, cultures remain negative.  CONDITION ON DISCHARGE:  Considerably improved.  DISCHARGE MEDICATIONS:  The patient will continue on: 1. Phenergan 25 mg as needed. 2. Docusate sodium two tablets daily. 3. Laxative of choice. 4. Ferrous sulfate 325 mg daily with food. 5. Potassium chloride 10 mEq daily. 6. Lorazepam 0.5 mg as needed.  FOLLOWUP:  Her appointment on May 22 has been cancelled.  She will see me again on May 31.  She will see Dr. Abbey Chatters at the next available appointment since the patient's final chemotherapy cycle has been cancelled and accordingly she will be ready to undergo surgery one month sooner than expected.  DIET:  Diet is regular.  ACTIVITY:  She will increase activity slowly.  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  SPECIAL INSTRUCTIONS:  She will call for any further episodes of hypotension, fever or any other complaints.     Lowella Dell, M.D.     Gina Nelson  D:  05/27/2010  T:  05/27/2010  Job:  [id number]  cc:   Adolph Pollack, M.D. [address], [address] [place] [address]  Lurline Hare, M.D.  Milus Mallick. Lodema Hong, M.D. Fax: [phone]  Etter Sjogren, M.D. Fax: [phone]  Electronically Signed by Ruthann Cancer M.D. on 05/27/2010 04:05:15 PM

## 2010-05-27 NOTE — H&P (Signed)
Gina Nelson, Gina Nelson               ACCOUNT NO.:  [id number]  MEDICAL RECORD NO.:  [id number]           PATIENT TYPE:  I  LOCATION:  1342                         FACILITY:  [facility]  PHYSICIAN:  Lowella Dell, M.D.DATE OF BIRTH:  [birth year]  DATE OF ADMISSION:  05/24/2010 DATE OF DISCHARGE:                             HISTORY & PHYSICAL   HISTORY OF PRESENT ILLNESS:  Gina Nelson is a 60 year old Gina Nelson woman with a history of breast cancer dating back to January of 2012 when she underwent right breast and right axillary lymph node biopsy for what by MRI was a 4.9 cm mass with multiple suspicious axillary lymph nodes.  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  The MIB-1 was between 41% and 66%.  She has been treated neoadjuvantly and has received 4 cycles of Cytoxan and Taxotere completed April 10, followed by one of 2 planned doses of every 3-week doxorubicin, cyclophosphamide received on May 14, 2010.  She also received Neulasta on May 15, 2010, as well as 1 L of IV fluids that same day.  Today, the patient's family called to said that the patient was exceedingly weak and she could not stand up.  She was vomiting and they did not think she could stay at home safely.  In the office indeed, the patient was found to be tachycardic with a heart rate of 144 (her baseline is between 61 and 118).  Her weight is down 5-1/2 pounds as compared to 3 days prior and she was orthostatic and vomited when was asked to stand up from a wheelchair.  The patient is being admitted for dehydration likely pancytopenia (see labs below) and poorly controlled nausea.  PAST MEDICAL HISTORY:  Significant for being status post right total knee replacement under Dr.  Mckinley Jewel in November of 2011; history GERD; history of bilateral tubal ligation; history of  simple hysterectomy; history of bladder sling procedure; and history of laparoscopic cholecystectomy.  FAMILY HISTORY:  The patient's father died at the age of 29 with prostate cancer metastatic to bone.  There is no history of breast or ovarian cancer in the family to the patient's knowledge.  GYNECOLOGIC HISTORY:  She had menarche age 80.  She had hysterectomy in 1984 and she is GX, P1.  She was 18 at the time of that delivery.  She used hormone replacement for about 2-1/2 years.  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  Her husband Veatrice Bourbon is a retired Chartered certified accountant.  Daughter Ulani Degrasse keeps at home for handicapped girls and her husband [name] works in the Medical laboratory scientific officer business.  The patient has a 55 year old granddaughter.  HEALTH MAINTENANCE:  Shantai had a colonoscopy in October of 2006 under Dr. Lionel December.  She said her cholesterol is "fine."  There is no history of tobacco or EtOH abuse.  She had a bone density at Dr. Claris Che Simpson's office approximately 2 years ago and that was also reportedly normal.  Most recent Pap smear was in 2010.  ALLERGIES:  She is intolerant to CODEINE.  MEDICATIONS:  Most recent medications include oxycodone APAP, Phenergan, ondansetron, EMLA cream, Decadron, lorazepam, Compazine, potassium chloride, ciprofloxacin, and Diflucan.  REVIEW OF SYSTEMS:  She denies headaches or visual changes.  She has been vomiting as noted and vomited today in the office when helped to stand up.  She denies shortness of breath, cough, phlegm production or pleurisy.  She denies fever.  She is not aware of palpitations, chest pain or pressure.  She had her last bowel movement approximately 2 days ago with the help of a laxative ([employer] brand).  She denies hematuria or dysuria.  She is not aware of any bleeding problems.  She has not been eating very much and she has been drinking  very little for the past 24 hours.  LABORATORY DATA:  Lab work obtained on May 21, 2010, showed a total white cell count of 600, neutrophil count 300, hemoglobin 8.5 and platelets 128,000.  Her creatinine was 0.51.  Her liver function tests were normal.  The albumin was down to 2.6.  Films:  Her staging studies included a CT scan of the chest and a PET scan on January 23.  Aside from the hypermetabolic right inferior breast mass and the previously biopsied right axillary lymph node, there were no other areas of uptake on the chest CT scan.  There was mild cardiomegaly and some hepatic steatosis on the CT scan of the chest. Echocardiogram on April 25 showed a 65% ejection fraction with normal wall motion.  IMPRESSION AND PLAN:  This is a 60 year old [place] woman with a history of breast cancer, currently day 11 and cycle 5 of chemotherapy, being admitted with pancytopenia, profound fatigue, nausea, vomiting, dehydration, orthostasis and malnutrition.  The plan is for support until the patient's count and functional status improves.  She will be covered with antibiotics and we will use cefepime instead of Cipro which she had been taking.  We will continue the overall Diflucan.  My feeling is that she would likely benefit from blood transfusion but the patient at this point refuses and certainly it is not mandatory unless she starts to develop severe shortness of breath or chest pain.  The plan is to support the patient until the counts recover and hopefully the clinical situation improves.  At this point I do not anticipate proceeding to a 6th cycle of chemotherapy and therefore it would be appropriate for Dr. Michell Heinrich to start making plans for radiation treatments likely to start in the next 2 to 3 weeks.     Lowella Dell, M.D.     Ronna Polio  D:  05/24/2010  T:  05/24/2010  Job:  [id number]  cc:   Adolph Pollack, M.D. [address], [address] [place]  [address]  Lurline Hare, M.D.  Juanetta Gosling, MD [address] [place] [address]  Milus Mallick. Lodema Hong, M.D. Fax: [phone]  Etter Sjogren, M.D. Fax: [phone]  Electronically Signed by Ruthann Cancer M.D. on 05/27/2010 04:05:12 PM

## 2010-05-31 NOTE — Consult Note (Signed)
NAME:  Gina Nelson, Gina Nelson                         ACCOUNT NO.:  [id number]   MEDICAL RECORD NO.:  [id number]                   PATIENT TYPE:  OUT   LOCATION:  RAD                                  FACILITY:  [facility]   PHYSICIAN:  Lionel December, M.D.                 DATE OF BIRTH:  01-Sep-1950   DATE OF CONSULTATION:  DATE OF DISCHARGE:                                   CONSULTATION   REASON FOR CONSULTATION:  Dysphagia.   HISTORY OF PRESENT ILLNESS:  Gina Nelson is a 60 year old African-American female  who is referred through the courtesy of Dr. Leona Carry for a GI evaluation.  She presents with over 10-yar history of dysphagia.  She had her initial  evaluation in [place].  She does not remember the details except that the  tests were normal.  She believes she also had esophagal manometry.  She did  have an EGD by Dr. Elpidio Anis in April 1994 which was normal other than  duodenitis.  She had EGD by me which revealed small sliding hiatal hernia  and a gastritis with a CLO negative test.  She said she has dysphagia every  day experienced both to solids as well as to liquids.  She points to her  suprasternal area as the sight of bolus obstruction.  Food eventually goes  down and then she keeps having regurgitation.  She feels discomfort in her  throat as if somebody has had a hand around her throat.  She has been  watching her diet and stays away from fatty and greasy foods.  She does not  experience heartburn.  She states she has been on Nexium for a few months  but she discontinued 1 month ago and she cannot tell any difference  whatsoever.  She has fair appetite.  She generally eats two meals a day.  She is afraid to eat because of her dysphagia and regurgitation.  However,  she has not lost any weight recently.  Her bowels moved daily with the help  of stool softeners.  She denies melena or rectal bleeding.  In the past she  has been on other PPIs and she does not recall that any of these made  any  difference.   She had a barium study on March 01, 2003 which revealed mild diffuse  impairment of esophageal motility.  A 13 mm barium freely passed from oral  cavity to the stomach without any delay.   She is presently on Premarin 0.3 mg daily.   PAST MEDICAL HISTORY:  History of GERD as above.  She has had tubal  ligation, hysterectomy, tacking of her bladder.  She has had benign cyst  removed from both her breasts.  She had laparoscopic cholecystectomy,  possibly in December 1998 for biliary dyskinesia.   ALLERGIES:  None known.   FAMILY HISTORY:  Father died of unknown carcinoma at age 20.  Her mother  died of pneumonia at age 29.  She has four sisters and three brothers.  They  are in good health except one sister had Whipple procedure last year for  pancreatic carcinoma.  She is in her early 77s.   SOCIAL HISTORY:  She is married.  She has one child.  She is currently  working at [employer].  She does office work.  She has never  smoked cigarettes and does not drink alcohol.   PHYSICAL EXAMINATION:  GENERAL:  Pleasant, well-developed, well-nourished  African-American female who is in no acute distress.  VITAL SIGNS:  She weighs 176.5 pounds, she is 5 feet 4 inches tall.  Pulse  78 per minute, blood pressure 120/60.  HEENT:  Conjunctivae is pink, sclerae is nonicteric.  Oropharyngeal mucosa  is normal.  NECK:  Without masses or thyromegaly.  CARDIAC:  With regular rhythm, normal S1 and S2.  No murmur or gallop noted.  LUNGS:  Clear to auscultation.  ABDOMEN:  Symmetrical, soft and nontender, without organomegaly and masses.  RECTAL:  Deferred.  EXTREMITIES:  She does not have clubbing or peripheral edema.   ASSESSMENT:  Gina Nelson is a 60 year old African-American female who presents  with an over 10-year history of dysphagia experience both with solids as  well as liquids.  She also has throat symptoms; however, she has never  responded to acid  suppression.  Furthermore, there has been no change in her  symptoms since she stopped her Nexium 1 month ago.  Her barium study suggest  mildly-impaired esophageal motility which may explain her chronic symptom  and lack of response to any therapy.   RECOMMENDATIONS:  Diagnostic esophagogastroduodenoscopy and possible  esophageal dilatation.   If her exam is normal and she does not respond to ED she would need  esophageal manometry.  She is reluctant to have manometry with a probe and  therefore will need manometry with a nasal esophageal catheter and therefore  will be a candidate for Bravo device.   We also briefly touched upon screening colonoscopy and suggested that she  should consider having this once her upper GI symptomatology has been  addressed.   We would like to thank Dr. Leona Carry for the opportunity to participate in  the care of this nice lady.      ___________________________________________                                            Lionel December, M.D.   NR/MEDQ  D:  03/27/2003  T:  03/28/2003  Job:  [id number]   cc:   Bernerd Limbo. Leona Carry, M.D.  [address]  [place]  [place] [address]  Fax: [phone]   [facility]

## 2010-05-31 NOTE — Op Note (Signed)
Gina Nelson, KAIGLER               ACCOUNT NO.:  [id number]   MEDICAL RECORD NO.:  [id number]          PATIENT TYPE:  AMB   LOCATION:  DAY                           FACILITY:  [facility]   PHYSICIAN:  Lionel December, M.D.    DATE OF BIRTH:  28-Aug-1950   DATE OF PROCEDURE:  11/08/2004  DATE OF DISCHARGE:                                 OPERATIVE REPORT   PROCEDURE:  Colonoscopy.   INDICATIONS:  Venesa is 60 year old Afro-American female who is undergoing  screening colonoscopy. She has chronic constipation which she is able to  control by watching her diet and taking Colace. Family history is negative  for colorectal carcinoma, but one of her sisters has been treated for  cholangiocarcinoma and remains in remission.   Procedure risks were reviewed with the patient, and informed consent was  obtained.   PREMEDICATION:  Demerol 50 mg IV, Versed 6 mg IV in divided dose.   FINDINGS:  Procedure performed in endoscopy suite. The patient's vital signs  and O2 saturation were monitored during the procedure and remained stable.  The patient was placed in left lateral position. Rectal examination  performed. No abnormality noted on external or digital exam. Olympus  videoscope was placed in rectum and advanced under vision into sigmoid colon  and beyond. Preparation was satisfactory. Somewhat redundant colon, but  scope was advanced to cecum which was identified by appendiceal orifice and  ileocecal valve. Pictures taken for the record. There was a tiny polyp at  cecum on a fold which was ablated via cold biopsy. Mucosa of the rest of the  colon was normal. Rectal mucosa similarly was normal. Scope was retroflexed  to examine anorectal junction which was unremarkable. Endoscope was  straightened and withdrawn. The patient tolerated the procedure well.   FINAL DIAGNOSIS:  Small cecal polyp which was ablated via cold biopsy. Rest  of the exam was normal.   RECOMMENDATIONS:  She should continue  high-fiber diet along with fiber  supplement and Colace as before. I will be contacting the patient with  biopsy results and further recommendations.      Lionel December, M.D.  Electronically Signed     NR/MEDQ  D:  11/08/2004  T:  11/08/2004  Job:  [id number]   cc:   Bernerd Limbo. Leona Carry, M.D.  Fax: [phone]

## 2010-05-31 NOTE — Op Note (Signed)
NAME:  Gina Nelson, Gina Nelson                         ACCOUNT NO.:  [id number]   MEDICAL RECORD NO.:  [id number]                   PATIENT TYPE:  AMB   LOCATION:  DAY                                  FACILITY:  [facility]   PHYSICIAN:  Lionel December, M.D.                 DATE OF BIRTH:  1950-03-10   DATE OF PROCEDURE:  DATE OF DISCHARGE:                                 OPERATIVE REPORT   PROCEDURE:  Esophagogastroduodenoscopy with esophageal dilatation.   ENDOSCOPIST:  Lionel December, M.D.   INDICATIONS:  This patient is a 60 year old African-American female with  chronic dysphagia primarily to solids who remembers not responding to ED  about 10 years ago.  She has been on a PPI but without symptomatic  improvement.  She is undergoing diagnostic/therapeutic procedure.  Procedure  and risks were reviewed with the patient and informed consent was obtained   PREOPERATIVE MEDICATIONS:  Cetacaine spray for oropharyngeal topical  anesthesia, Demerol 50 mg IV and Versed 10 mg IV in divided dose.   FINDINGS:  Procedure performed in endoscopy suite.  The patient's vital  signs and O2 saturation were monitored during the procedure and remained  stable.  The patient was placed in the left lateral recumbent position and  Olympus videoscope was passed via the oropharynx without any difficulty into  the esophagus.   ESOPHAGUS:  Mucosa of the esophagus was normal throughout.  Squamocolumnar  junction was unremarkable.   STOMACH:  It was empty and distended very well with insufflation.  The folds  of the proximal stomach were normal.  Examination of the mucosa was normal  with a single petechiae at antrum.  Pyloric channel was patent.  Angularis,  fundus, and cardia were examined by retroflexing the scope and were normal.   DUODENUM:  Examination of the bulb reveals normal mucosa.  The scope was  passed into the second part of the duodenum were mucosa and folds were  normal.  Endoscope was withdrawn.   Esophagus was dilated by passing 56 and 58 [place] Maloney dilators to full  insertion.  The esophagus was reexamined.  There were 2 tiny linear tears at  the cervical esophagus just below upper esophageal sphincter.   The endoscope was withdrawn.  The patient tolerated the procedure well.   FINAL DIAGNOSES:  1. Normal esophagogastroduodenoscopy.  2. 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  3. It remains to be seen whether or not her dysphagia will improve with this     intervention.   RECOMMENDATIONS:  1. She will resume her usual meds.  2. If she does not improve she will call with progress report next week.  If     she remains with dysphagia, we will bring her back for esophageal     manometry.  ___________________________________________                                            Lionel December, M.D.   NR/MEDQ  D:  03/31/2003  T:  03/31/2003  Job:  [id number]   cc:   Bernerd Limbo. Leona Carry, M.D.  [address]  [place]  [place] [address]  Fax: [phone]

## 2010-05-31 NOTE — Consult Note (Signed)
NAME:  Gina Nelson, Gina Nelson                         ACCOUNT NO.:  [id number]   MEDICAL RECORD NO.:  [id number]                   PATIENT TYPE:  OUT   LOCATION:  RAD                                  FACILITY:  [facility]   PHYSICIAN:  Lionel December, M.D.                 DATE OF BIRTH:  February 09, 1950   DATE OF CONSULTATION:  DATE OF DISCHARGE:                                   CONSULTATION   PRESENTING COMPLAINT:  Dysphagia with solids as well as liquids.   HISTORY OF PRESENT ILLNESS:  Gina Nelson is a 60 year old African American female  who was referred through the courtesy of Dr. Leona Carry for GI evaluation.  She presents with over a 10 year history of dysphagia.  She states no  therapy has made any difference as far as her symptoms are concerned.  She  had esophagogastroduodenoscopy back in 1994 by Dr. Elpidio Anis.      ___________________________________________                                            Lionel December, M.D.   NR/MEDQ  D:  03/27/2003  T:  03/28/2003  Job:  [id number]

## 2010-06-13 ENCOUNTER — Other Ambulatory Visit: Payer: Self-pay | Admitting: Oncology

## 2010-06-13 ENCOUNTER — Encounter (HOSPITAL_BASED_OUTPATIENT_CLINIC_OR_DEPARTMENT_OTHER): Payer: BC Managed Care – PPO | Admitting: Oncology

## 2010-06-13 DIAGNOSIS — R112 Nausea with vomiting, unspecified: Secondary | ICD-10-CM

## 2010-06-13 DIAGNOSIS — R5381 Other malaise: Secondary | ICD-10-CM

## 2010-06-13 DIAGNOSIS — Z5111 Encounter for antineoplastic chemotherapy: Secondary | ICD-10-CM

## 2010-06-13 DIAGNOSIS — C50919 Malignant neoplasm of unspecified site of unspecified female breast: Secondary | ICD-10-CM

## 2010-06-13 DIAGNOSIS — R634 Abnormal weight loss: Secondary | ICD-10-CM

## 2010-06-13 DIAGNOSIS — Z5189 Encounter for other specified aftercare: Secondary | ICD-10-CM

## 2010-06-13 DIAGNOSIS — R5383 Other fatigue: Secondary | ICD-10-CM

## 2010-06-13 LAB — COMPREHENSIVE METABOLIC PANEL
Alkaline Phosphatase: 90 U/L (ref 39–117)
Glucose, Bld: 83 mg/dL (ref 70–99)
Sodium: 140 mEq/L (ref 135–145)
Total Bilirubin: 0.3 mg/dL (ref 0.3–1.2)
Total Protein: 6.1 g/dL (ref 6.0–8.3)

## 2010-06-13 LAB — CBC WITH DIFFERENTIAL/PLATELET
BASO%: 0.3 % (ref 0.0–2.0)
EOS%: 0.8 % (ref 0.0–7.0)
Eosinophils Absolute: 0 10*3/uL (ref 0.0–0.5)
LYMPH%: 19.1 % (ref 14.0–49.7)
MCH: 27.8 pg (ref 25.1–34.0)
MCHC: 32.2 g/dL (ref 31.5–36.0)
MCV: 86.4 fL (ref 79.5–101.0)
MONO%: 14.9 % — ABNORMAL HIGH (ref 0.0–14.0)
Platelets: 292 10*3/uL (ref 145–400)
RBC: 3.94 10*6/uL (ref 3.70–5.45)

## 2010-06-21 ENCOUNTER — Encounter (HOSPITAL_COMMUNITY)
Admission: RE | Admit: 2010-06-21 | Discharge: 2010-06-21 | Disposition: A | Discharge status: Home / Self Care | Payer: BC Managed Care – PPO | Source: Ambulatory Visit | Attending: General Surgery | Admitting: General Surgery

## 2010-06-21 LAB — COMPREHENSIVE METABOLIC PANEL
Alkaline Phosphatase: 112 U/L (ref 39–117)
BUN: 7 mg/dL (ref 6–23)
CO2: 28 mEq/L (ref 19–32)
Chloride: 106 mEq/L (ref 96–112)
GFR calc Af Amer: >60 mL/min (ref >60)
Glucose, Bld: 80 mg/dL (ref 70–99)
Potassium: 3.9 mEq/L (ref 3.5–5.1)
Total Bilirubin: 0.4 mg/dL (ref 0.3–1.2)

## 2010-06-21 LAB — DIFFERENTIAL
Eosinophils Absolute: 0.4 10*3/uL (ref 0.0–0.7)
Lymphs Abs: 1.5 10*3/uL (ref 0.7–4.0)
Monocytes Relative: 15 % — ABNORMAL HIGH (ref 3–12)
Neutro Abs: 4.8 10*3/uL (ref 1.7–7.7)
Neutrophils Relative %: 61 % (ref 43–77)

## 2010-06-21 LAB — CBC
Hemoglobin: 11.4 g/dL — ABNORMAL LOW (ref 12.0–15.0)
MCH: 27.1 pg (ref 26.0–34.0)
MCV: 85.7 fL (ref 78.0–100.0)
RBC: 4.21 MIL/uL (ref 3.87–5.11)

## 2010-06-21 LAB — SURGICAL PCR SCREEN: MRSA, PCR: NEGATIVE

## 2010-06-27 ENCOUNTER — Ambulatory Visit (HOSPITAL_COMMUNITY)
Admission: RE | Admit: 2010-06-27 | Discharge: 2010-06-28 | Disposition: A | Discharge status: Home / Self Care | Payer: BC Managed Care – PPO | Source: Ambulatory Visit | Attending: General Surgery | Admitting: General Surgery

## 2010-06-27 ENCOUNTER — Other Ambulatory Visit (INDEPENDENT_AMBULATORY_CARE_PROVIDER_SITE_OTHER): Payer: Self-pay | Admitting: General Surgery

## 2010-06-27 DIAGNOSIS — Z79899 Other long term (current) drug therapy: Secondary | ICD-10-CM | POA: Insufficient documentation

## 2010-06-27 DIAGNOSIS — Z01812 Encounter for preprocedural laboratory examination: Secondary | ICD-10-CM | POA: Insufficient documentation

## 2010-06-27 DIAGNOSIS — C773 Secondary and unspecified malignant neoplasm of axilla and upper limb lymph nodes: Secondary | ICD-10-CM | POA: Insufficient documentation

## 2010-06-27 DIAGNOSIS — C50919 Malignant neoplasm of unspecified site of unspecified female breast: Secondary | ICD-10-CM | POA: Insufficient documentation

## 2010-07-03 ENCOUNTER — Ambulatory Visit
Admission: RE | Admit: 2010-07-03 | Discharge: 2010-07-03 | Disposition: A | Discharge status: Home / Self Care | Payer: BC Managed Care – PPO | Source: Ambulatory Visit | Attending: Radiation Oncology | Admitting: Radiation Oncology

## 2010-07-03 DIAGNOSIS — C50919 Malignant neoplasm of unspecified site of unspecified female breast: Secondary | ICD-10-CM | POA: Insufficient documentation

## 2010-07-04 NOTE — Op Note (Signed)
Gina Nelson, Gina Nelson NO.:  [id number]  MEDICAL RECORD NO.:  [id number]  LOCATION:  5128                         FACILITY:  [facility]  PHYSICIAN:  Adolph Pollack, M.D.DATE OF BIRTH:  09-Dec-1950  DATE OF PROCEDURE:  06/27/2010 DATE OF DISCHARGE:                              OPERATIVE REPORT   PREOPERATIVE DIAGNOSIS:  Right breast cancer (T2N1).  POSTOPERATIVE DIAGNOSIS:  Right breast cancer (T2N1).  PROCEDURE: 1. Right modified radical mastectomy. 2. Left simple mastectomy.  SURGEON:  Adolph Pollack, MD  ASSISTANT:  Currie Paris, MD  ANESTHESIA:  General.  INDICATIONS:  Ms. Deberry is a 60 year old female who discovered a right breast mass on self-examination.  She is also noted to have enlarged axillary lymph nodes.  Image-guided biopsies of the mass and the lymph nodes demonstrated invasive breast cancer with metastasis to right axillary lymph nodes.  It is estrogen receptor positive, progesterone receptor positive with a proliferation of 56%.  She underwent a Port-A-Cath insertion and neoadjuvant chemotherapy and now she presents for definitive procedure.  After all options, she selected the above two operations.  TECHNIQUE:  She was seen in the holding area and brought to the operating room, placed supine on the operating table and general anesthetic was administered.  The chest wall and breasts and abdominal wall were widely, sterilely prepped and draped.  Beginning on the left side, I made an elliptical incision through the skin and dermis.  Using electrocautery, I raised skin flaps superior to the clavicle, medially to the sternum, inferiorly to the anterior rectus sheath and laterally to the latissimus muscle.  I then used electrocautery to dissect the left breast tissue free from the pectoralis major muscle and fascia and removed it.  I marked the medial aspect with a stitch and sent it to pathology.  Next, I irrigated out the  wound and controlled bleeding points with electrocautery.  A stab incision was made in the inferior flap and a 19 Blake drain was placed into the wound and anchored to the skin with 3-0 nylon suture.  Further inspection demonstrated no bleeding.  I then closed the wound in two layers.  The subcutaneous tissues were approximated with interrupted 3-0 Vicryl sutures.  The skin was closed with a running 3-0 Monocryl subcuticular stitch.  Following this, the right side was approached.  An elliptical incision was made at the right breast around the nipple dividing the skin and dermis.  I then raised skin flaps to the sternum medially, to the anterior rectus sheath inferiorly, to the clavicle superiorly with care to avoid where the Port-A-Cath was in the right upper chest wall, to latissimus muscle laterally.  There was one breech where the Port-A-Cath was and I closed this with 3-0 Vicryl suture.  Once I had done this, I then dissected the right breast tissue free from the pectoralis major muscle and retracted the breast laterally.  The breast was somewhat separate from the axillary contents.  I then used a sharp dissection to enter the axilla.  I used blunt dissection to identify the right axillary vein.  I identified both the thoracodorsal nerve and the long thoracic nerve.  I then dissected  the lymphatic tissue in between these two nerves and clipped lymphatics.  I dissected this tissue free from the lateral aspect of the wound using electrocautery.  I then freed this up as a separate specimen with respect to the axillary contents and sent this separately.  I then dissected the right breast free from the chest wall and marked the medial aspect with a suture and sent it to pathology as well.  Following this, I copiously irrigated out the wound and controlled bleeding with the electrocautery.  Hemostasis was noted to be adequate at this time.  I then made two stab incisions in the inferior  flap and placed a 19 Blake drain in the axilla and one at the right chest wall. These were anchored to the skin with 3-0 nylon suture.  Further inspection demonstrated continued hemostasis.  I then closed the wound in two layers.  The subcutaneous tissue was closed with interrupted 3-0 Vicryl sutures.  The skin was closed with running 4-0 Monocryl subcuticular stitch.  Steri-Strips were then applied to both wounds as well as sterile dressings and a breast binder.  She tolerated the procedures well without any apparent complications and was taken to the recovery room in satisfactory condition.     Adolph Pollack, M.D.     Kari Baars  D:  06/27/2010  T:  06/28/2010  Job:  [id number]  cc:   Milus Mallick. Lodema Hong, M.D. Pierce Crane, M.D., F.R.C.P.C. Lurline Hare, M.D. Dina L. Judyann Munson, MD  Electronically Signed by Avel Peace M.D. on 07/04/2010 04:54:09 AM

## 2010-07-10 ENCOUNTER — Encounter (INDEPENDENT_AMBULATORY_CARE_PROVIDER_SITE_OTHER): Payer: Self-pay | Admitting: General Surgery

## 2010-07-10 ENCOUNTER — Ambulatory Visit (INDEPENDENT_AMBULATORY_CARE_PROVIDER_SITE_OTHER): Payer: BC Managed Care – PPO | Admitting: General Surgery

## 2010-07-10 DIAGNOSIS — C50919 Malignant neoplasm of unspecified site of unspecified female breast: Secondary | ICD-10-CM

## 2010-07-10 DIAGNOSIS — C773 Secondary and unspecified malignant neoplasm of axilla and upper limb lymph nodes: Secondary | ICD-10-CM

## 2010-07-10 NOTE — Patient Instructions (Signed)
Remove bandages in two days.  May shower if small wounds have healed. Continue light actitivities with arms.

## 2010-07-10 NOTE — Progress Notes (Signed)
Subjective:     Patient ID: Gina Nelson, female   DOB: [birth year], 60 y.o.   MRN: [id number]    There were no vitals taken for this visit.    HPI Tear for a postoperative visit following her left simple mastectomy and right modified radical mastectomy. She had 4/13 lymph nodes involved with cancer.  Margins were clear.  She is T1cN2. She is comfortable and not having significant pain. She has 3 drains in. All are draining less than 30 cc per day.  Review of Systems     Objective:   Physical Exam Gen.: Looks well and in no acute distress.  Chest: The chest wall incisions are clean, dry, and intact. Serous output is coming from each drain. All drains were removed and sterile dressings applied.    Assessment:     T1cN2 right breast cancer.  Drains removed.  Wounds look good.    Plan:     Continue light activities with both arms. He'll have her enroll in after breast surgery exercise class in early August. Told her not to allow anybody to do a needle stick or blood pressure check in her right arm and less it was an emergency.  Return visit in 3 weeks.

## 2010-07-11 ENCOUNTER — Encounter (HOSPITAL_BASED_OUTPATIENT_CLINIC_OR_DEPARTMENT_OTHER): Payer: BC Managed Care – PPO | Admitting: Oncology

## 2010-07-11 ENCOUNTER — Other Ambulatory Visit (INDEPENDENT_AMBULATORY_CARE_PROVIDER_SITE_OTHER): Payer: Self-pay | Admitting: General Surgery

## 2010-07-11 ENCOUNTER — Other Ambulatory Visit: Payer: Self-pay | Admitting: Oncology

## 2010-07-11 DIAGNOSIS — Z170 Estrogen receptor positive status [ER+]: Secondary | ICD-10-CM

## 2010-07-11 DIAGNOSIS — C50519 Malignant neoplasm of lower-outer quadrant of unspecified female breast: Secondary | ICD-10-CM

## 2010-07-11 DIAGNOSIS — C773 Secondary and unspecified malignant neoplasm of axilla and upper limb lymph nodes: Secondary | ICD-10-CM

## 2010-07-11 DIAGNOSIS — R634 Abnormal weight loss: Secondary | ICD-10-CM

## 2010-07-11 LAB — CBC WITH DIFFERENTIAL/PLATELET
Basophils Absolute: 0 10*3/uL (ref 0.0–0.1)
EOS%: 5.6 % (ref 0.0–7.0)
Eosinophils Absolute: 0.4 10*3/uL (ref 0.0–0.5)
HCT: 34.6 % — ABNORMAL LOW (ref 34.8–46.6)
HGB: 10.7 g/dL — ABNORMAL LOW (ref 11.6–15.9)
MCH: 26.7 pg (ref 25.1–34.0)
MCV: 86.3 fL (ref 79.5–101.0)
MONO%: 8.2 % (ref 0.0–14.0)
NEUT%: 63.1 % (ref 38.4–76.8)
lymph#: 1.5 10*3/uL (ref 0.9–3.3)

## 2010-07-16 ENCOUNTER — Ambulatory Visit (HOSPITAL_BASED_OUTPATIENT_CLINIC_OR_DEPARTMENT_OTHER)
Admission: RE | Admit: 2010-07-16 | Discharge: 2010-07-16 | Disposition: A | Discharge status: Home / Self Care | Payer: BC Managed Care – PPO | Source: Ambulatory Visit | Attending: General Surgery | Admitting: General Surgery

## 2010-07-16 DIAGNOSIS — Z452 Encounter for adjustment and management of vascular access device: Secondary | ICD-10-CM

## 2010-07-16 HISTORY — PX: PORT-A-CATH REMOVAL: SHX5289

## 2010-07-16 LAB — POCT I-STAT, CHEM 8
Calcium, Ion: 1.15 mmol/L (ref 1.12–1.32)
Chloride: 106 mEq/L (ref 96–112)
Creatinine, Ser: 0.7 mg/dL (ref 0.50–1.10)
Glucose, Bld: 78 mg/dL (ref 70–99)
HCT: 37 % (ref 36.0–46.0)

## 2010-07-23 NOTE — Op Note (Signed)
  NAMELASHAYE, FISK               ACCOUNT NO.:  [id number]  MEDICAL RECORD NO.:  [id number]  LOCATION:  SDS                          FACILITY:  [facility]  PHYSICIAN:  Adolph Pollack, M.D.DATE OF BIRTH:  [birth year]  DATE OF PROCEDURE:  07/16/2010 DATE OF DISCHARGE:  06/21/2010                              OPERATIVE REPORT   PREOPERATIVE DIAGNOSIS:  Retained Port-A-Cath in patient with breast cancer.  POSTOPERATIVE DIAGNOSIS:  Retained Port-A-Cath in patient with breast cancer.  PROCEDURE:  Port-A-Cath removal.  SURGEON:  Adolph Pollack, MD  ANESTHESIA:  Local with MAC.  INDICATIONS:  This is a 60 year old female who has had a Port-A-Cath and receiving chemotherapy.  She no longer needs and now presents for removal.  The procedure risks including bleeding, infection, wound healing problems were discussed with her preoperatively.  TECHNIQUE:  She is seen in the holding area and brought to the operating, placed supine on the operating room, and given intravenous sedation.  The right upper chest and neck were sterilely prepped and draped.  Local anesthetic consisting of 1% Xylocaine was infiltrated at the previous Port-A-Cath incision site superficially and deep.  I then re- incised the previous incision used to place the Port-A-Cath in and carried this down through the subcutaneous tissues.  I identified the catheter and dissected the fibrous sheath free from it.  I then removed the catheter which was positioned in the right internal jugular vein. Direct pressure was held over the right internal jugular vein for approximately 15 minutes.  Using electrocautery, I dissected the port away from free from the chest wall and removed the port and the catheter intact.  Bleeding points were controlled with electrocautery.  I then closed subcutaneous tissue with a running 3-0 Vicryl suture.  Skin was closed with a 4-0 Monocryl subcuticular stitch.  Steri-Strips and  sterile dressings were applied.  She tolerated the procedure without any apparent complications and was taken to recovery room in satisfactory condition.     Adolph Pollack, M.D.   ______________________________ Adolph Pollack, M.D.    Kari Baars  D:  07/16/2010  T:  07/17/2010  Job:  [id number]  cc:   Lowella Dell, M.D.  Electronically Signed by Avel Peace M.D. on 07/23/2010 10:12:32 AM

## 2010-07-29 ENCOUNTER — Encounter (INDEPENDENT_AMBULATORY_CARE_PROVIDER_SITE_OTHER): Payer: Self-pay | Admitting: General Surgery

## 2010-07-29 ENCOUNTER — Ambulatory Visit (INDEPENDENT_AMBULATORY_CARE_PROVIDER_SITE_OTHER): Payer: BC Managed Care – PPO | Admitting: General Surgery

## 2010-07-29 DIAGNOSIS — C50919 Malignant neoplasm of unspecified site of unspecified female breast: Secondary | ICD-10-CM | POA: Insufficient documentation

## 2010-07-29 NOTE — Progress Notes (Signed)
Returns after Port-A-Cath removal. Overall is doing well. Tolerating after breast surgery exercises well.  Exam: Both chest wall incisions are clean and intact. Right upper chest wall Port-A-Cath removal incision clean and intact.  Assessment: T2 N1 right breast cancer-doing well after surgical treatments.  Plan: Return visit 3 months.

## 2010-09-17 ENCOUNTER — Other Ambulatory Visit: Payer: Self-pay | Admitting: Oncology

## 2010-09-17 ENCOUNTER — Encounter (HOSPITAL_BASED_OUTPATIENT_CLINIC_OR_DEPARTMENT_OTHER): Payer: BC Managed Care – PPO | Admitting: Oncology

## 2010-09-17 DIAGNOSIS — R5383 Other fatigue: Secondary | ICD-10-CM

## 2010-09-17 DIAGNOSIS — R634 Abnormal weight loss: Secondary | ICD-10-CM

## 2010-09-17 DIAGNOSIS — R112 Nausea with vomiting, unspecified: Secondary | ICD-10-CM

## 2010-09-17 DIAGNOSIS — C50919 Malignant neoplasm of unspecified site of unspecified female breast: Secondary | ICD-10-CM

## 2010-09-17 DIAGNOSIS — Z170 Estrogen receptor positive status [ER+]: Secondary | ICD-10-CM

## 2010-09-17 DIAGNOSIS — C50519 Malignant neoplasm of lower-outer quadrant of unspecified female breast: Secondary | ICD-10-CM

## 2010-09-17 DIAGNOSIS — C773 Secondary and unspecified malignant neoplasm of axilla and upper limb lymph nodes: Secondary | ICD-10-CM

## 2010-09-17 LAB — CBC WITH DIFFERENTIAL/PLATELET
BASO%: 0.5 % (ref 0.0–2.0)
EOS%: 3.1 % (ref 0.0–7.0)
HCT: 35.5 % (ref 34.8–46.6)
LYMPH%: 17.8 % (ref 14.0–49.7)
MCH: 28.7 pg (ref 25.1–34.0)
MCHC: 33.2 g/dL (ref 31.5–36.0)
MCV: 86.3 fL (ref 79.5–101.0)
MONO%: 12 % (ref 0.0–14.0)
NEUT%: 66.6 % (ref 38.4–76.8)
lymph#: 0.7 10*3/uL — ABNORMAL LOW (ref 0.9–3.3)

## 2010-09-18 LAB — COMPREHENSIVE METABOLIC PANEL
Albumin: 4 g/dL (ref 3.5–5.2)
BUN: 8 mg/dL (ref 6–23)
CO2: 26 mEq/L (ref 19–32)
Calcium: 9.2 mg/dL (ref 8.4–10.5)
Chloride: 102 mEq/L (ref 96–112)
Creatinine, Ser: 0.74 mg/dL (ref 0.50–1.10)
Glucose, Bld: 71 mg/dL (ref 70–99)

## 2010-09-18 LAB — VITAMIN D 25 HYDROXY (VIT D DEFICIENCY, FRACTURES): Vit D, 25-Hydroxy: 16 ng/mL — ABNORMAL LOW (ref 30–89)

## 2010-09-18 LAB — CANCER ANTIGEN 27.29: CA 27.29: 20 U/mL (ref 0–39)

## 2010-09-30 ENCOUNTER — Ambulatory Visit
Admission: RE | Admit: 2010-09-30 | Discharge: 2010-09-30 | Disposition: A | Discharge status: Home / Self Care | Payer: BC Managed Care – PPO | Source: Ambulatory Visit | Attending: Oncology | Admitting: Oncology

## 2010-09-30 DIAGNOSIS — C50919 Malignant neoplasm of unspecified site of unspecified female breast: Secondary | ICD-10-CM

## 2010-10-29 ENCOUNTER — Encounter (INDEPENDENT_AMBULATORY_CARE_PROVIDER_SITE_OTHER): Payer: BC Managed Care – PPO | Admitting: General Surgery

## 2010-10-31 ENCOUNTER — Ambulatory Visit
Admission: RE | Admit: 2010-10-31 | Discharge: 2010-10-31 | Disposition: A | Discharge status: Home / Self Care | Payer: BC Managed Care – PPO | Source: Ambulatory Visit | Attending: Radiation Oncology | Admitting: Radiation Oncology

## 2010-11-11 ENCOUNTER — Other Ambulatory Visit: Payer: BC Managed Care – PPO

## 2010-11-12 ENCOUNTER — Encounter (INDEPENDENT_AMBULATORY_CARE_PROVIDER_SITE_OTHER): Payer: Self-pay | Admitting: General Surgery

## 2010-11-12 ENCOUNTER — Encounter (INDEPENDENT_AMBULATORY_CARE_PROVIDER_SITE_OTHER): Payer: BC Managed Care – PPO | Admitting: General Surgery

## 2010-11-12 ENCOUNTER — Ambulatory Visit (INDEPENDENT_AMBULATORY_CARE_PROVIDER_SITE_OTHER): Payer: BC Managed Care – PPO | Admitting: General Surgery

## 2010-11-12 VITALS — BP 114/62 | HR 62 | Temp 96.8°F | Resp 14 | Ht 65.0 in | Wt 151.4 lb

## 2010-11-12 DIAGNOSIS — C50919 Malignant neoplasm of unspecified site of unspecified female breast: Secondary | ICD-10-CM

## 2010-11-12 NOTE — Progress Notes (Signed)
Operation:  Right MRM, Left SM for Right breast CA  Date:  06/27/2010  Stage: T1cN2  Hormone receptor status:  Positive  HPI:  Gina Nelson is here for followup of her right breast cancer. She is back at work.  XRT is completed.  She denies any chest wall nodules or adenopathy.  No swelling of right arm.  PE:  Gen-she looks well  Chest:  Right side demonstrates XRT changes to the skin; no nodules. Left side demonstrates no nodules.  Lymph nodes:  No palpable cervical, supraclavicular, or axillary adenopathy.  Assessment:  Right breast cancer- no clinical evidence of recurrence.  Plan:  Return visit in 3-4  Months.

## 2010-12-13 ENCOUNTER — Telehealth: Payer: Self-pay

## 2010-12-13 NOTE — Telephone Encounter (Signed)
Received message from pt stating she needed to have some dental work done and wanted to know if it was ok.  Called her back [phone], and she states she had her teeth cleaned 2 weeks ago and had antibiotics before then, and now needs to have "partials done" which her dentist told her may make her gums tender.  Per Zollie Scale, PA, ok to have this dental work done.  Pt verbalizes understanding.

## 2010-12-16 ENCOUNTER — Encounter: Payer: Self-pay | Admitting: *Deleted

## 2010-12-16 NOTE — Progress Notes (Signed)
Pt called to this RN with concern due to ongoing headache over the weekend. Headache is localized more on the right temporal area. Pt denies visual changes, nausea, weakness or numbness.  [place] used advil migraine over the weekend with some benefit.  Per phone discussion recommendation is for pt to use clariten ( or type ) and tylenol.  Pt will call in am if headache not resolved.  MD is aware of above call with no additional recommendations at this time.

## 2010-12-26 ENCOUNTER — Other Ambulatory Visit: Payer: Self-pay | Admitting: Oncology

## 2010-12-26 ENCOUNTER — Telehealth: Payer: Self-pay | Admitting: Oncology

## 2010-12-26 ENCOUNTER — Encounter: Payer: Self-pay | Admitting: Physician Assistant

## 2010-12-26 ENCOUNTER — Other Ambulatory Visit (HOSPITAL_BASED_OUTPATIENT_CLINIC_OR_DEPARTMENT_OTHER): Payer: BC Managed Care – PPO | Admitting: Lab

## 2010-12-26 ENCOUNTER — Ambulatory Visit (HOSPITAL_BASED_OUTPATIENT_CLINIC_OR_DEPARTMENT_OTHER): Payer: BC Managed Care – PPO | Admitting: Physician Assistant

## 2010-12-26 VITALS — BP 120/75 | HR 85 | Temp 98.5°F | Ht 65.0 in | Wt 156.4 lb

## 2010-12-26 DIAGNOSIS — Z170 Estrogen receptor positive status [ER+]: Secondary | ICD-10-CM

## 2010-12-26 DIAGNOSIS — C50919 Malignant neoplasm of unspecified site of unspecified female breast: Secondary | ICD-10-CM

## 2010-12-26 DIAGNOSIS — C773 Secondary and unspecified malignant neoplasm of axilla and upper limb lymph nodes: Secondary | ICD-10-CM

## 2010-12-26 DIAGNOSIS — C50519 Malignant neoplasm of lower-outer quadrant of unspecified female breast: Secondary | ICD-10-CM

## 2010-12-26 LAB — COMPREHENSIVE METABOLIC PANEL
Alkaline Phosphatase: 132 U/L — ABNORMAL HIGH (ref 39–117)
CO2: 28 mEq/L (ref 19–32)
Creatinine, Ser: 0.78 mg/dL (ref 0.50–1.10)
Glucose, Bld: 64 mg/dL — ABNORMAL LOW (ref 70–99)
Sodium: 143 mEq/L (ref 135–145)
Total Bilirubin: 0.4 mg/dL (ref 0.3–1.2)

## 2010-12-26 LAB — CBC WITH DIFFERENTIAL/PLATELET
BASO%: 0.4 % (ref 0.0–2.0)
Eosinophils Absolute: 0.1 10*3/uL (ref 0.0–0.5)
MCHC: 32.5 g/dL (ref 31.5–36.0)
MONO#: 0.6 10*3/uL (ref 0.1–0.9)
NEUT#: 3 10*3/uL (ref 1.5–6.5)
RBC: 4.29 10*6/uL (ref 3.70–5.45)
RDW: 16 % — ABNORMAL HIGH (ref 11.2–14.5)
WBC: 5 10*3/uL (ref 3.9–10.3)

## 2010-12-26 LAB — VITAMIN D 25 HYDROXY (VIT D DEFICIENCY, FRACTURES): Vit D, 25-Hydroxy: 26 ng/mL — ABNORMAL LOW (ref 30–89)

## 2010-12-26 NOTE — Progress Notes (Signed)
Hematology and Oncology Follow Up Visit  Gina Nelson [id number] 07-17-1950 60 y.o. 12/26/2010    HPI: Gina Nelson is a Gina Nelson woman who is referred in January 2012 at the age of 22 for treatment of right breast carcinoma.  The patient noted a mass in her right breast and underwent diagnostic mammography and ultrasonography on 01/10/2010. There was a palpable discreet mass in the right breast associated with skin thickening. Mammogram showed obscured mass, associated with distortion in that same area by ultrasound. The mass was irregular and hypoechoic, 1.9 cm. In the right axilla, there was an enlarged lymph node measuring 2.1 cm.  A biopsy with was obtained under ultrasound guidance on 01/17/2010 ([id number]), showing an invasive ductal carcinoma. The right axillary lymph node was biopsied at that same time and was in fact involved. This appeared to be low grade, strongly ER +100%, PR +34%, HER-2/neu negative with a CISH ratio of 1.58, and an MIB-1 of 1 of 41%.  Bilateral breast MRIs on 01/23/2010 showed a 4.9 cm lobulated irregular mass with spiculated margins in the right breast, at 7:00, with some skin enhancement. There was linear enhancement extending to the nipple, no apparent extension to the pectoralis muscle. There were 2 lymph nodes noted in the right axilla, one 8 mm, the other 6 mm. Left breast was unremarkable.  Patient was treated in the neoadjuvant setting and received 4 cycles of docetaxel and cyclophosphamide, followed by one cycle of doxorubicin and cyclophosphamide, at which point neoadjuvant therapy was discontinued due to poor tolerance.  Status post right modified radical mastectomy with left prophylactic mastectomy in June 2012. Left breast was benign. On the right, 2 areas of residual tumor, one measuring 1.1 cm, the other less than 1 cm. Both were grade 3. 4 of 13 lymph nodes were involved. Repeat prognostic panel showed ER positive at 94% and PR +83%,  HER-2/neu still negative.  Received adjuvant radiation, completed mid September of 2012 at which time patient began on letrozole, 2.5 mg daily. She continues on letrozole now, the goal being to complete a total of 5 years.   Interim History:   Patient returns today for routine three-month followup to assess tolerance of letrozole which she began in September 2012. Interval history is unremarkable, and the patient is feeling well. She takes her letrozole and the evening to avoid some mild nausea. She continues to have hot flashes, although these do not seem to have worsened since beginning the letrozole. She has some joint stiffness, especially in her hands, but this does not seem to have changed significantly since beginning the letrozole. She's had no increased vaginal dryness. Her hair is growing back. She is back to work full time. The family's all doing well.   A detailed review of systems is otherwise noncontributory as noted below.  Review of Systems: Constitutional:  Hot flashes, no weight loss, fever, night sweats and feels well Eyes: negative BJY:NWGNF congestion Cardiovascular: no chest pain or dyspnea on exertion Respiratory: no cough, shortness of breath, or wheezing Neurological: negative Dermatological: negative Gastrointestinal: no abdominal pain, change in bowel habits, or black or bloody stools Genito-Urinary: no dysuria, trouble voiding, or hematuria Hematological and Lymphatic: negative Breast: negative Musculoskeletal: positive for - joint stiffness Remaining ROS negative.  Family History: The patient's father died at age 82 with prostate cancer, metastatic to bone. No other family history of cancer to the patient's knowledge.   Gynecologic History: Menarche at age 38. Gx P1, 18 at the time of delivery.  Status post hysterectomy in 1984. Used hormone replacement for 2 and half years.   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 Her husband, Ivar Drape, is a retired Company secretary, currently a Engineer, site. Patient has 1 daughter, Gina Nelson, and one granddaughter.   Medications: I have reviewed the patient's current medications. Current Outpatient Prescriptions  Medication Sig Dispense Refill  . Bisacodyl (LAXATIVE PO) Take by mouth every other day.        . Calcium Carbonate (CALTRATE 600 PO) Take by mouth daily.        . IRON PO Take by mouth.        . letrozole (FEMARA) 2.5 MG tablet       . Potassium (POTASSIMIN PO) Take by mouth.          Allergies:  Allergies  Allergen Reactions  . Codeine Itching    Arms and face    Physical Exam: Filed Vitals:   12/26/10 1108  BP: 120/75  Pulse: 85  Temp: 98.5 F (36.9 C)   HEENT:  Sclerae anicteric, conjunctivae pink.  Oropharynx clear.  No mucositis or candidiasis.   Nodes:  No cervical, supraclavicular, or axillary lymphadenopathy palpated.  Breast Exam:   status post bilateral mastectomies, well-healed incision, with no suspicious nodularity, skin changes, or evidence of recurrence in the chest wall.  Lungs:  Clear to auscultation bilaterally.  No crackles, rhonchi, or wheezes.   Heart:  Regular rate and rhythm.   Abdomen:  Soft, nontender.  Positive bowel sounds.  No organomegaly or masses palpated.   Musculoskeletal:  No focal spinal tenderness to palpation.  Extremities:  Benign.  No peripheral edema or cyanosis.   Skin:  Benign.   Neuro:  Nonfocal.   Lab Results: Lab Results  Component Value Date   WBC 5.0 12/26/2010   HGB 12.2 12/26/2010   HCT 37.4 12/26/2010   MCV 87.1 12/26/2010   PLT 187 12/26/2010   NEUTROABS 3.0 12/26/2010     Chemistry      Component Value Date/Time   NA 141 09/17/2010 1258   NA 141 09/17/2010 1258   NA 141 09/17/2010 1258   K 3.9 09/17/2010 1258   K 3.9 09/17/2010 1258   K 3.9 09/17/2010 1258   CL 102 09/17/2010 1258   CL 102 09/17/2010 1258   CL 102 09/17/2010 1258   CO2 26 09/17/2010 1258   CO2 26 09/17/2010 1258    CO2 26 09/17/2010 1258   BUN 8 09/17/2010 1258   BUN 8 09/17/2010 1258   BUN 8 09/17/2010 1258   CREATININE 0.74 09/17/2010 1258   CREATININE 0.74 09/17/2010 1258   CREATININE 0.74 09/17/2010 1258      Component Value Date/Time   CALCIUM 9.2 09/17/2010 1258   CALCIUM 9.2 09/17/2010 1258   CALCIUM 9.2 09/17/2010 1258   ALKPHOS 117 09/17/2010 1258   ALKPHOS 117 09/17/2010 1258   ALKPHOS 117 09/17/2010 1258   AST 20 09/17/2010 1258   AST 20 09/17/2010 1258   AST 20 09/17/2010 1258   ALT 10 09/17/2010 1258   ALT 10 09/17/2010 1258   ALT 10 09/17/2010 1258   BILITOT 0.4 09/17/2010 1258   BILITOT 0.4 09/17/2010 1258   BILITOT 0.4 09/17/2010 1258        Radiological Studies:  Bone density was obtained on 09/30/2010 and was normal.    Assessment:  60 year old [place] woman  1. Status post right breast and axillary lymph node biopsy  in January 2012, both positive for invasive ductal carcinoma, measured 4.9 cm by MRI. Tumor was ER positive, PR positive, HER-2/neu negative, with an elevated MIB-1.  2. Treated neoadjuvant weight with 4 cycles of docetaxel and cyclophosphamide, followed by one dose of doxorubicin and cyclophosphamide, at which point neoadjuvant treatment was discontinued due to poor tolerance  3. Status post right modified radical mastectomy and left prophylactic mastectomy in June 2012. Left side was benign. On the right, 2 areas of residual tumor, one measuring 1.1 cm, but other less than 1 cm. Both tumors were grade 3. 4 of 13 lymph nodes were involved. Tumor was still ER and PR positive, HER-2/neu negative.  4. Status post adjuvant radiation therapy, completed mid September 2012.  4. Started on letrozole, 2.5 mg daily, in mid-September with completion of radiation. Continues now on letrozole with good tolerance.   Plan:  With regards to her breast cancer, the patient is doing quite well, and will continue on letrozole as planned. She is scheduled to meet with her surgeon again in approximately 3  months, and we'll see [place] in 6 months. She knows to call prior that visit however she has any problems.    This plan was reviewed with the patient, who voices understanding and agreement.    Michiko Lineman, PA-C 12/26/2010

## 2010-12-26 NOTE — Telephone Encounter (Signed)
Gv pt appt for June2013

## 2010-12-27 ENCOUNTER — Telehealth: Payer: Self-pay | Admitting: *Deleted

## 2010-12-27 ENCOUNTER — Other Ambulatory Visit: Payer: Self-pay | Admitting: Physician Assistant

## 2010-12-27 DIAGNOSIS — C50919 Malignant neoplasm of unspecified site of unspecified female breast: Secondary | ICD-10-CM

## 2010-12-27 NOTE — Telephone Encounter (Signed)
This RN called pt at work and left message for a return call per noted lab that needs to be repeated per review by AB/PA.

## 2010-12-28 ENCOUNTER — Telehealth: Payer: Self-pay | Admitting: Oncology

## 2010-12-28 NOTE — Telephone Encounter (Signed)
called pt lmovm for appt in march2013. asked pt to rtn call to confirm appt

## 2011-02-06 ENCOUNTER — Encounter (INDEPENDENT_AMBULATORY_CARE_PROVIDER_SITE_OTHER): Payer: Self-pay | Admitting: General Surgery

## 2011-02-14 ENCOUNTER — Encounter (INDEPENDENT_AMBULATORY_CARE_PROVIDER_SITE_OTHER): Payer: Self-pay | Admitting: General Surgery

## 2011-02-14 ENCOUNTER — Ambulatory Visit (INDEPENDENT_AMBULATORY_CARE_PROVIDER_SITE_OTHER): Payer: BC Managed Care – PPO | Admitting: General Surgery

## 2011-02-14 VITALS — BP 124/68 | HR 70 | Temp 97.4°F | Resp 18 | Ht 65.0 in | Wt 162.6 lb

## 2011-02-14 DIAGNOSIS — Z853 Personal history of malignant neoplasm of breast: Secondary | ICD-10-CM

## 2011-02-14 NOTE — Progress Notes (Signed)
Operation:  Right MRM, Left SM for Right breast CA  Date:  06/27/2010  Stage: T1cN2  Hormone receptor status:  Positive  HPI:  Gina Nelson is here for followup of her right breast cancer. She is back at work.  XRT is completed.  She denies any chest wall nodules or adenopathy.  No swelling of right arm.  She is having joint pains in her hands with some swelling R > L.  She is on Femara.  PE:  Gen-she looks well  Chest:  Right side demonstrates XRT changes to the skin; no nodules. Left side demonstrates no nodules.  Lymph nodes:  No palpable cervical, supraclavicular, or axillary adenopathy.  Mild swelling of mcp joints of right hand.  Assessment:  Right breast cancer- no clinical evidence of recurrence.  Having some arthralgias which may be from the Femara.  Plan:  Return visit in 3-4  Months.

## 2011-02-14 NOTE — Patient Instructions (Signed)
Call us if you find any hard nodules on your chest wall.

## 2011-02-18 ENCOUNTER — Ambulatory Visit (HOSPITAL_BASED_OUTPATIENT_CLINIC_OR_DEPARTMENT_OTHER): Payer: BC Managed Care – PPO | Admitting: Oncology

## 2011-02-18 DIAGNOSIS — Z170 Estrogen receptor positive status [ER+]: Secondary | ICD-10-CM

## 2011-02-18 DIAGNOSIS — M1990 Unspecified osteoarthritis, unspecified site: Secondary | ICD-10-CM

## 2011-02-18 DIAGNOSIS — IMO0001 Reserved for inherently not codable concepts without codable children: Secondary | ICD-10-CM

## 2011-02-18 DIAGNOSIS — C50919 Malignant neoplasm of unspecified site of unspecified female breast: Secondary | ICD-10-CM

## 2011-02-18 DIAGNOSIS — Z79811 Long term (current) use of aromatase inhibitors: Secondary | ICD-10-CM

## 2011-02-18 NOTE — Progress Notes (Signed)
ID: Gina Nelson  DOB: January 18, 1950  MR#: [id number]  CSN#: [id number]   Interval History:   Gina Nelson returns today for followup of her breast cancer. She continues to work full-time, and is participating in the [employer] in [place]. Her sister, a pancreatic cancer survivor, also participates. Overall Gina Nelson is doing well, enjoying her work, and feeling "grateful" even though she had to go through all the treatment for her cancer.  ROS:  However she does not seem to be tolerating the letrozole well at all. She has significant pain in both hands right greater than left. She can't open jars of. She also has some foot discomfort, but remarkably not elbows of knees or hips. She has no muscle pain. She has very limited range of motion of the right upper extremity which of course is where she had the axillary dissection. There is no lymphedema there. Aside from hot flashes, a detailed review of systems was otherwise negative.  Allergies  Allergen Reactions  . Codeine Itching    Arms and face    Current Outpatient Prescriptions  Medication Sig Dispense Refill  . Bisacodyl (LAXATIVE PO) Take by mouth every other day.        . Calcium Carbonate (CALTRATE 600 PO) Take by mouth daily.        . IRON PO Take by mouth.        . letrozole (FEMARA) 2.5 MG tablet       . Potassium (POTASSIMIN PO) Take by mouth.         PAST MEDICAL HISTORY:  The patient underwent right total knee replacement under Dr. Richardson Landry November 30, 2009.  She has a history of GERD, a history of bilateral tubal ligation, a history of simple hysterectomy, a history of bladder "uptuck," history of prior breast biopsies for benign cysts, history of laparoscopic cholecystectomy.  FAMILY HISTORY:  The patient's father had prostate cancer metastatic to bone.  He died at the age of 45.  There is no other family history of cancer in the family to the patient's knowledge.  GYNECOLOGIC HISTORY:  Menarche age 73.  She stopped having periods  of course with her hysterectomy in 1984.  She is Gx, P1.  She was 18 at the time of that delivery.  She used hormone replacement for about two and a half years.  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  Her husband, Gina Nelson, is here today, he is a retired IT sales professional and currently a Engineer, site.  Daughter, Gina Nelson, is present today as well.  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  Her husband, Gina Nelson, works in the Medical laboratory scientific officer business.  The patient has one granddaughter who is 69.  HEALTH MAINTENANCE:  She had a colonoscopy in October, 2006 under Dr. Lionel December which showed only a small cecal polyp.  She tells me her cholesterol is "fine."  There is no history of tobacco or ETOH abuse.  She had a bone density September 2012 which was normal.  Her  Pap smear is scheduled for this week.  Objective:  Filed Vitals:   02/18/11 0830  BP: 134/84  Pulse: 93  Temp: 98 F (36.7 C)    BMI: Body mass index is 27.09 kg/(m^2).   ECOG FS: 0  Physical Exam:   Sclerae unicteric  Oropharynx clear  No peripheral adenopathy  Lungs clear -- no rales or rhonchi  Heart regular rate and rhythm  Abdomen benign  MSK no focal spinal tenderness, no peripheral edema; range of motion of the right upper extremity is very limited, only 70 abduction  Neuro nonfocal  Breast exam: Status post bilateral mastectomies. No evidence of local recurrence  Lab Results:      Chemistry      Component Value Date/Time   NA 143 12/26/2010 1051   NA 143 12/26/2010 1051   K 3.9 12/26/2010 1051   K 3.9 12/26/2010 1051   CL 106 12/26/2010 1051   CL 106 12/26/2010 1051   CO2 28 12/26/2010 1051   CO2 28 12/26/2010 1051   BUN 13 12/26/2010 1051   BUN 13 12/26/2010 1051   CREATININE 0.78 12/26/2010 1051   CREATININE 0.78 12/26/2010 1051      Component Value Date/Time   CALCIUM 9.9 12/26/2010 1051   CALCIUM 9.9 12/26/2010 1051   ALKPHOS 132*  12/26/2010 1051   ALKPHOS 132* 12/26/2010 1051   AST 22 12/26/2010 1051   AST 22 12/26/2010 1051   ALT 14 12/26/2010 1051   ALT 14 12/26/2010 1051   BILITOT 0.4 12/26/2010 1051   BILITOT 0.4 12/26/2010 1051       Lab Results  Component Value Date   WBC 5.0 12/26/2010   HGB 12.2 12/26/2010   HCT 37.4 12/26/2010   MCV 87.1 12/26/2010   PLT 187 12/26/2010   NEUTROABS 3.0 12/26/2010    Studies/Results:  No new results found.  Assessment: 61 year old read [place] woman status post right breast and axillary lymph node biopsy January 2012, both positive for an invasive ductal carcinoma, grade 3, measuring 4.9 cm by MRI, and so advanced stage IIB clinically. The tumor was ER positive, PR positive, HER2 negative, with an elevated MIB-1.  She received neoadjuvant docetaxel/cyclophosphamide x4 followed by doxorubicin/ cyclophosphamide x1, at which point neoadjuvant treatment was discontinued because of poor tolerance, and she underwent right modified radical mastectomy with left prophylactic mastectomy June 2012.  The left side was benign.  On the right, she had 2 areas of residual tumor, one measuring 1.1 cm, the other less than a centimeter, both grade 3, involving 4 out of 13 lymph nodes sampled.  Repeat HER2 was again negative, and repeat estrogen and progesterone receptors were again positive at 94% and 83%, respectively.  She then started adjuvant radiation treatments completed September of 2012, at which time she started letrozole.   Plan: I am not sure whether she is having arthralgias/myalgias secondary to the letrozole, or whether she is developing early rheumatoid arthritis or other inflammatory arthritis. We're going to stop the letrozole. Ideally I would wait a couple of months before starting another medication, but she is anxious "not to be on nothing" so she will be starting tamoxifen later this month. She has a good understanding of the possible toxicities side effects and  complications of that medication. She is going to see me again in May. If she is tolerating the tamoxifen well we will continue that. If she continues to have the same symptoms, we will refer her to a rheumatologist for further evaluation  MAGRINAT,GUSTAV C 02/18/2011

## 2011-02-21 ENCOUNTER — Ambulatory Visit (INDEPENDENT_AMBULATORY_CARE_PROVIDER_SITE_OTHER): Payer: BC Managed Care – PPO | Admitting: Family Medicine

## 2011-02-21 ENCOUNTER — Other Ambulatory Visit (HOSPITAL_COMMUNITY)
Admission: RE | Admit: 2011-02-21 | Discharge: 2011-02-21 | Disposition: A | Discharge status: Home / Self Care | Payer: BC Managed Care – PPO | Source: Ambulatory Visit | Attending: Family Medicine | Admitting: Family Medicine

## 2011-02-21 ENCOUNTER — Encounter: Payer: Self-pay | Admitting: Family Medicine

## 2011-02-21 VITALS — BP 114/70 | HR 83 | Resp 16 | Ht 65.0 in | Wt 161.4 lb

## 2011-02-21 DIAGNOSIS — R1900 Intra-abdominal and pelvic swelling, mass and lump, unspecified site: Secondary | ICD-10-CM

## 2011-02-21 DIAGNOSIS — Z01419 Encounter for gynecological examination (general) (routine) without abnormal findings: Secondary | ICD-10-CM | POA: Insufficient documentation

## 2011-02-21 DIAGNOSIS — R5381 Other malaise: Secondary | ICD-10-CM

## 2011-02-21 DIAGNOSIS — Z13220 Encounter for screening for lipoid disorders: Secondary | ICD-10-CM

## 2011-02-21 DIAGNOSIS — Z1211 Encounter for screening for malignant neoplasm of colon: Secondary | ICD-10-CM

## 2011-02-21 DIAGNOSIS — Z0000 Encounter for general adult medical examination without abnormal findings: Secondary | ICD-10-CM

## 2011-02-21 LAB — POC HEMOCCULT BLD/STL (OFFICE/1-CARD/DIAGNOSTIC): Fecal Occult Blood, POC: NEGATIVE

## 2011-02-21 NOTE — Patient Instructions (Addendum)
CPE in 1 year.  Please call if you need to see me before.  Fasting lipid and TSH with next labs  You are referred for a pelvic ultrasound and also for a colonoscopy, you will be called with appointments.  Pls continue healthy lifestyle, with regular physical activity and also healthy diet.  One multivitamin once daily along with calcium supplement recommended

## 2011-02-21 NOTE — Progress Notes (Signed)
  Subjective:    Patient ID: Gina Nelson, female    DOB: 1950/12/26, 61 y.o.   MRN: [id number]  HPI The PT is here for annual exam  and re-evaluation of chronic medical conditions, medication management and review of any available recent lab and radiology data.  Preventive health is updated, specifically  Cancer screening and Immunization.   Gina Nelson has had significant health issues since she was last seen in this office. She called in reporting a breast  Lump which subsequently turned out to be malignant. She has had a double mastectomy, chemo and radiation therapy, and is back at work since last Fall.She became extremely weak and ill following a particular chemo treatment and had to be hospitalized.  She c/o of some discomfort and weakness in the right hand , and this is being followed by her oncologist Her colonoscopy is due and this will be scheduled. A tSH and lipid panel will be obtained at next draw. Her appetite is back to normal and her hair has re grown. Mentally and emotionally she is in a good place She also had right knee surgery since her last visit     Review of Systems See HPI Denies recent fever or chills. Denies sinus pressure, nasal congestion, ear pain or sore throat. Denies chest congestion, productive cough or wheezing. Denies chest pains, palpitations and leg swelling Denies abdominal pain, nausea, vomiting,diarrhea or constipation.   Denies dysuria, frequency, hesitancy or incontinence. Denies joint pain, swelling and limitation in mobility. Denies headaches, seizures, numbness, or tingling. Denies depression, anxiety or insomnia. Denies skin break down or rash.        Objective:   Physical Exam  Pleasant well nourished female, alert and oriented x 3, in no cardio-pulmonary distress. Afebrile. HEENT No facial trauma or asymetry. Sinuses non tender.  EOMI, PERTL, fundoscopic exam is normal, no hemorhage or exudate.  External ears normal, tympanic  membranes clear. Oropharynx moist, no exudate, good dentition. Neck: supple, no adenopathy,JVD or thyromegaly.No bruits.  Chest: Clear to ascultation bilaterally.No crackles or wheezes. Non tender to palpation  Breast: Bilateral mastectomy. No adenopathy, supraclavicular or axillary.  Cardiovascular system; Heart sounds normal,  S1 and  S2 ,no S3.  No murmur, or thrill. Apical beat not displaced Peripheral pulses normal.  Abdomen: Soft, non tender, no organomegaly or masses. No bruits. Bowel sounds normal. No guarding, tenderness or rebound.  Rectal:  No mass. Guaiac negative stool.  GU: External genitalia normal. No lesions. Vaginal canal normal.No discharge. Uterus absent, questionable adnexal mass.Exam difficult and with breast ca history will request [place]  Musculoskeletal exam: Full ROM of spine, hips , shoulders and knees. No deformity ,swelling or crepitus noted. No muscle wasting or atrophy.   Neurologic: Cranial nerves 2 to 12 intact. Power slightly decreased in right hand,  tone ,sensation and reflexes normal throughout. No disturbance in gait. No tremor.  Skin: Intact, no ulceration, erythema , scaling or rash noted. Pigmentation normal throughout  Psych; Normal mood and affect. Judgement and concentration normal       Assessment & Plan:

## 2011-02-28 ENCOUNTER — Ambulatory Visit (HOSPITAL_COMMUNITY)
Admission: RE | Admit: 2011-02-28 | Discharge: 2011-02-28 | Disposition: A | Discharge status: Home / Self Care | Payer: BC Managed Care – PPO | Source: Ambulatory Visit | Attending: Family Medicine | Admitting: Family Medicine

## 2011-02-28 ENCOUNTER — Other Ambulatory Visit: Payer: Self-pay | Admitting: Family Medicine

## 2011-02-28 DIAGNOSIS — R1900 Intra-abdominal and pelvic swelling, mass and lump, unspecified site: Secondary | ICD-10-CM

## 2011-03-03 ENCOUNTER — Encounter (INDEPENDENT_AMBULATORY_CARE_PROVIDER_SITE_OTHER): Payer: Self-pay | Admitting: *Deleted

## 2011-03-04 ENCOUNTER — Other Ambulatory Visit (HOSPITAL_COMMUNITY): Payer: BC Managed Care – PPO

## 2011-03-06 ENCOUNTER — Other Ambulatory Visit (INDEPENDENT_AMBULATORY_CARE_PROVIDER_SITE_OTHER): Payer: Self-pay | Admitting: *Deleted

## 2011-03-06 ENCOUNTER — Encounter (INDEPENDENT_AMBULATORY_CARE_PROVIDER_SITE_OTHER): Payer: Self-pay | Admitting: *Deleted

## 2011-03-06 ENCOUNTER — Telehealth (INDEPENDENT_AMBULATORY_CARE_PROVIDER_SITE_OTHER): Payer: Self-pay | Admitting: *Deleted

## 2011-03-06 DIAGNOSIS — Z1211 Encounter for screening for malignant neoplasm of colon: Secondary | ICD-10-CM

## 2011-03-06 MED ORDER — PEG-KCL-NACL-NASULF-NA ASC-C 100 G PO SOLR: 1.0000 | Freq: Once | ORAL | Status: DC

## 2011-03-06 NOTE — Telephone Encounter (Signed)
Patient needs movi prep 

## 2011-03-27 ENCOUNTER — Other Ambulatory Visit (HOSPITAL_BASED_OUTPATIENT_CLINIC_OR_DEPARTMENT_OTHER): Payer: BC Managed Care – PPO | Admitting: Lab

## 2011-03-27 DIAGNOSIS — C50919 Malignant neoplasm of unspecified site of unspecified female breast: Secondary | ICD-10-CM

## 2011-03-27 LAB — CBC WITH DIFFERENTIAL/PLATELET
Basophils Absolute: 0 10*3/uL (ref 0.0–0.1)
EOS%: 2.1 % (ref 0.0–7.0)
Eosinophils Absolute: 0.1 10*3/uL (ref 0.0–0.5)
HGB: 12.5 g/dL (ref 11.6–15.9)
MCH: 28.1 pg (ref 25.1–34.0)
MONO%: 11.3 % (ref 0.0–14.0)
NEUT#: 3.7 10*3/uL (ref 1.5–6.5)
RBC: 4.46 10*6/uL (ref 3.70–5.45)
RDW: 14.7 % — ABNORMAL HIGH (ref 11.2–14.5)
lymph#: 0.9 10*3/uL (ref 0.9–3.3)

## 2011-03-28 ENCOUNTER — Other Ambulatory Visit: Payer: BC Managed Care – PPO

## 2011-03-28 LAB — COMPREHENSIVE METABOLIC PANEL
ALT: 9 U/L (ref 0–35)
AST: 16 U/L (ref 0–37)
Albumin: 4.2 g/dL (ref 3.5–5.2)
Alkaline Phosphatase: 122 U/L — ABNORMAL HIGH (ref 39–117)
BUN: 10 mg/dL (ref 6–23)
Calcium: 9.4 mg/dL (ref 8.4–10.5)
Chloride: 107 mEq/L (ref 96–112)
Potassium: 4.2 mEq/L (ref 3.5–5.3)
Sodium: 142 mEq/L (ref 135–145)
Total Protein: 6 g/dL (ref 6.0–8.3)

## 2011-04-21 ENCOUNTER — Telehealth (INDEPENDENT_AMBULATORY_CARE_PROVIDER_SITE_OTHER): Payer: Self-pay | Admitting: *Deleted

## 2011-04-21 NOTE — Telephone Encounter (Signed)
Requesting MD: simpson  PCP:  simpson     Name & DOB: triva hueber [birth year]     Procedure: tcs  Reason/Indication:  screening  Has patient had this procedure before?  yes  If so, when, by whom and where?  2002  Is there a family history of colon cancer?  no  Who?  What age when diagnosed?    Is patient diabetic?   no      Does patient have prosthetic heart valve?  no  Do you have a pacemaker?  no  Has patient had joint replacement within last 12 months?  no  Is patient on Coumadin, Plavix and/or Aspirin? no  Medications: tamoxifen 20 mg daily, calcium 600 mg w/ Vit D daily, potassium 10 mg daily, equate laxative 5 mg bid, otc iron 65 mg daily    Allergies: codiene (itching)  Pharmacy: [facility]  Medication Adjustment: iron 10 days  Procedure date & time: 05/07/11 at 830

## 2011-04-23 NOTE — Telephone Encounter (Signed)
agree

## 2011-04-29 ENCOUNTER — Ambulatory Visit (INDEPENDENT_AMBULATORY_CARE_PROVIDER_SITE_OTHER): Payer: BC Managed Care – PPO | Admitting: Family Medicine

## 2011-04-29 ENCOUNTER — Encounter: Payer: Self-pay | Admitting: Family Medicine

## 2011-04-29 VITALS — BP 92/62 | HR 89 | Temp 98.8°F | Resp 15 | Ht 65.0 in | Wt 168.4 lb

## 2011-04-29 DIAGNOSIS — J309 Allergic rhinitis, unspecified: Secondary | ICD-10-CM

## 2011-04-29 DIAGNOSIS — R05 Cough: Secondary | ICD-10-CM

## 2011-04-29 DIAGNOSIS — R059 Cough, unspecified: Secondary | ICD-10-CM

## 2011-04-29 DIAGNOSIS — J302 Other seasonal allergic rhinitis: Secondary | ICD-10-CM

## 2011-04-29 MED ORDER — FLUTICASONE PROPIONATE 50 MCG/ACT NA SUSP: 1.0000 | Freq: Two times a day (BID) | NASAL | Status: DC | PRN

## 2011-04-29 MED ORDER — FEXOFENADINE-PSEUDOEPHED ER 180-240 MG PO TB24: 1.0000 | ORAL_TABLET | Freq: Every day | ORAL | Status: DC

## 2011-04-29 NOTE — Patient Instructions (Signed)
For the allergies use the nose spray and the allegra D Call if you not are not improved and I will send in antibiotics

## 2011-04-30 ENCOUNTER — Encounter: Payer: Self-pay | Admitting: Family Medicine

## 2011-04-30 DIAGNOSIS — R059 Cough, unspecified: Secondary | ICD-10-CM | POA: Insufficient documentation

## 2011-04-30 DIAGNOSIS — R05 Cough: Secondary | ICD-10-CM | POA: Insufficient documentation

## 2011-04-30 DIAGNOSIS — J302 Other seasonal allergic rhinitis: Secondary | ICD-10-CM | POA: Insufficient documentation

## 2011-04-30 NOTE — Assessment & Plan Note (Signed)
I think these are more allergy symptoms. She does not look ill appearing. I will have her continue her Claritin-D. Will also add Flonase

## 2011-04-30 NOTE — Assessment & Plan Note (Signed)
Per above, if she does not improve would then treat for URI with antibiotics, pt will call toward the end of the week if not improved

## 2011-04-30 NOTE — Progress Notes (Signed)
  Subjective:    Patient ID: Gina Nelson, female    DOB: 09-May-1950, 61 y.o.   MRN: [id number]  HPI   Mild cough and nasal congestion as well as drainage since last Thursday. This initially started after patient was outside and then she had some pollen blow into her car when she began to cough. Since then her nose was initially congested but has began running nonstop. She also has clear phlegm production with cough. She did try Claritin-D however this did not help very much. She's also start NyQuil which helps her cough and DayQuil during the day. She denies fever, fatigue, feeling ill otherwise. No sick contacts.   Review of Systems  GEN- denies fatigue, fever, weight loss,weakness, recent illness HEENT- denies eye drainage, change in vision, +nasal discharge, CVS- denies chest pain, palpitations RESP- denies SOB, +cough, wheeze ABD- denies N/V, change in stools, abd pain Neuro- denies headache, dizziness, syncope, seizure activity      Objective:   Physical Exam  GEN- NAD, alert and oriented x3 HEENT- PERRL, EOMI, non injected sclera, mild injection of conjunctiva, MMM, oropharynx clear, TM clear bilat, nares clear rhinorrhea, erythema surrounding nares Neck- Supple, no LAD CVS- RRR, no murmur RESP-CTAB EXT- No edema Pulses- Radial, DP- 2+       Assessment & Plan:

## 2011-05-01 ENCOUNTER — Telehealth: Payer: Self-pay | Admitting: Family Medicine

## 2011-05-02 ENCOUNTER — Other Ambulatory Visit: Payer: Self-pay | Admitting: Family Medicine

## 2011-05-02 MED ORDER — SULFAMETHOXAZOLE-TRIMETHOPRIM 800-160 MG PO TABS: 1.0000 | ORAL_TABLET | Freq: Two times a day (BID) | ORAL | Status: AC

## 2011-05-02 NOTE — Telephone Encounter (Signed)
Pharmacist called after hours pt at pharmacy, I sent in septra

## 2011-05-06 MED ORDER — SODIUM CHLORIDE 0.45 % IV SOLN
Freq: Once | INTRAVENOUS | Status: AC
  Administered 2011-05-07: 08:00:00 via INTRAVENOUS

## 2011-05-07 ENCOUNTER — Encounter (HOSPITAL_COMMUNITY)
Admission: RE | Disposition: A | Discharge status: Home / Self Care | Payer: Self-pay | Source: Ambulatory Visit | Attending: Internal Medicine

## 2011-05-07 ENCOUNTER — Encounter (HOSPITAL_COMMUNITY): Payer: Self-pay

## 2011-05-07 ENCOUNTER — Ambulatory Visit (HOSPITAL_COMMUNITY)
Admission: RE | Admit: 2011-05-07 | Discharge: 2011-05-07 | Disposition: A | Discharge status: Home / Self Care | Payer: BC Managed Care – PPO | Source: Ambulatory Visit | Attending: Internal Medicine | Admitting: Internal Medicine

## 2011-05-07 DIAGNOSIS — K6389 Other specified diseases of intestine: Secondary | ICD-10-CM | POA: Insufficient documentation

## 2011-05-07 DIAGNOSIS — D126 Benign neoplasm of colon, unspecified: Secondary | ICD-10-CM

## 2011-05-07 DIAGNOSIS — K644 Residual hemorrhoidal skin tags: Secondary | ICD-10-CM | POA: Insufficient documentation

## 2011-05-07 DIAGNOSIS — Z1211 Encounter for screening for malignant neoplasm of colon: Secondary | ICD-10-CM

## 2011-05-07 DIAGNOSIS — K5730 Diverticulosis of large intestine without perforation or abscess without bleeding: Secondary | ICD-10-CM | POA: Insufficient documentation

## 2011-05-07 HISTORY — DX: Nausea with vomiting, unspecified: R11.2

## 2011-05-07 HISTORY — DX: Other specified postprocedural states: Z98.890

## 2011-05-07 HISTORY — PX: COLONOSCOPY: SHX5424

## 2011-05-07 SURGERY — COLONOSCOPY
Anesthesia: Moderate Sedation

## 2011-05-07 MED ORDER — MEPERIDINE HCL 50 MG/ML IJ SOLN
INTRAMUSCULAR | Status: DC | PRN
  Administered 2011-05-07 (×2): 25 mg via INTRAVENOUS

## 2011-05-07 MED ORDER — MEPERIDINE HCL 50 MG/ML IJ SOLN
INTRAMUSCULAR | Status: AC
  Filled 2011-05-07: qty 1

## 2011-05-07 MED ORDER — STERILE WATER FOR IRRIGATION IR SOLN
Status: DC | PRN
  Administered 2011-05-07: 10:00:00

## 2011-05-07 MED ORDER — MIDAZOLAM HCL 5 MG/5ML IJ SOLN
INTRAMUSCULAR | Status: AC
  Filled 2011-05-07: qty 10

## 2011-05-07 MED ORDER — MIDAZOLAM HCL 5 MG/5ML IJ SOLN
INTRAMUSCULAR | Status: DC | PRN
  Administered 2011-05-07 (×2): 1 mg via INTRAVENOUS
  Administered 2011-05-07 (×2): 2 mg via INTRAVENOUS

## 2011-05-07 NOTE — Discharge Instructions (Signed)
Resume usual medications and diet. No driving for 24 hours. Physician will contact you with biopsy results. 

## 2011-05-07 NOTE — Op Note (Signed)
COLONOSCOPY PROCEDURE REPORT  PATIENT:  Gina Nelson  MR#:  [id number] Birthdate:  [birth year], 60 y.o., female Endoscopist:  Dr. Malissa Hippo, MD Referred By:  Dr. Milus Mallick. Lodema Hong, MD Procedure Date: 05/07/2011  Procedure:   Colonoscopy  Indications:  Patient is 61 year old African American female who is undergoing average risk screening colonoscopy. Last exam was in 2002.  Informed Consent:  The procedure and risks were reviewed with the patient and informed consent was obtained.  Medications:  Demerol 50 mg IV Versed 7 mg IV  Description of procedure:  After a digital rectal exam was performed, that colonoscope was advanced from the anus through the rectum and colon to the area of the cecum, ileocecal valve and appendiceal orifice. The cecum was deeply intubated. These structures were well-seen and photographed for the record. From the level of the cecum and ileocecal valve, the scope was slowly and cautiously withdrawn. The mucosal surfaces were carefully surveyed utilizing scope tip to flexion to facilitate fold flattening as needed. The scope was pulled down into the rectum where a thorough exam including retroflexion was performed.  Findings:   Prep excellent. Two small polyps ablated via cold biopsy and submitted in one container. One was across some ileocecal valve and the second one was at hepatic flexure. Two small diverticula at splenic flexure. Redundant sigmoid colon. Small hemorrhoids below the dentate line.  Therapeutic/Diagnostic Maneuvers Performed:  See above  Complications:  None  Cecal Withdrawal Time:  7 minutes  Impression:  Examination performed to cecum. Two small polyps ablated via cold biopsy and submitted together(cecum and hepatic flexure). Two small diverticula at splenic flexure. Small external hemorrhoids.  Recommendations:  Standardt instructions given. I will be contacting patient with biopsy results and further  recommendations.  Avani Sensabaugh U  05/07/2011 10:16 AM  CC: Dr. Syliva Overman, MD, MD & Dr. Bonnetta Barry ref. provider found

## 2011-05-07 NOTE — H&P (Signed)
Gina Nelson is an 61 y.o. female.   Chief Complaint: Patient is here for colonoscopy.  HPI: Patient is 61 year old female who is here for average risk screening colonoscopy. Patient's last exam was in 2002. She denies abdominal pain or rectal bleeding. She has constipation and she is able to control with dietary measures and by taking equate which is a fiber supplement. Family history is significant for pancreatic carcinoma and one sister who is doing well 10 years later following surgery. Patient had surgery followed by chemoradiation for breast carcinoma last year and is currently on tamoxifen  Past Medical History  Diagnosis Date  . Arthritis     knee  . Blood transfusion   . Cancer     right breast T1cN2  . Allergy   . PONV (postoperative nausea and vomiting)     Past Surgical History  Procedure Date  . Total knee arthroplasty 2011  . Abdominal hysterectomy 1984  . Port-a-cath removal 07/16/10  . Breast surgery right MRM, left simple mastectomy  . Breast surgery left mastectomy    Family History  Problem Relation Age of Onset  . Cancer Father     prostate   Social History:  reports that she has never smoked. She has never used smokeless tobacco. She reports that she does not drink alcohol or use illicit drugs.  Allergies:  Allergies  Allergen Reactions  . Codeine Itching    Arms and face    Medications Prior to Admission  Medication Sig Dispense Refill  . Bisacodyl (LAXATIVE PO) Take by mouth every other day.        . Calcium Carbonate-Vitamin D (CALCIUM-VITAMIN D) 600-200 MG-UNIT CAPS Take 2 capsules by mouth.      . fexofenadine-pseudoephedrine (ALLEGRA-D 24) 180-240 MG per 24 hr tablet Take 1 tablet by mouth daily.  30 tablet  2  . fluticasone (FLONASE) 50 MCG/ACT nasal spray Place 1 spray into the nose 2 (two) times daily as needed for rhinitis.  16 g  2  . IRON PO Take 1 tablet by mouth.       . letrozole (FEMARA) 2.5 MG tablet       . peg 3350 powder  (MOVIPREP) 100 G SOLR Take 1 kit (100 g total) by mouth once.  1 kit  0  . Potassium (POTASSIMIN PO) Take 10 mg by mouth.       . sulfamethoxazole-trimethoprim (SEPTRA DS) 800-160 MG per tablet Take 1 tablet by mouth 2 (two) times daily.  20 tablet  0  . tamoxifen (NOLVADEX) 20 MG tablet Take 20 mg by mouth daily.        No results found for this or any previous visit (from the past 48 hour(s)). No results found.  Review of Systems  Constitutional: Negative for weight loss.  Gastrointestinal: Negative for abdominal pain, diarrhea, blood in stool and melena.    Blood pressure 120/61, pulse 76, temperature 98.4 F (36.9 C), temperature source Oral, resp. rate 13, SpO2 100.00%. Physical Exam  Constitutional: She appears well-developed and well-nourished.  HENT:  Mouth/Throat: Oropharynx is clear and moist.  Eyes: Conjunctivae are normal. No scleral icterus.  Neck: No thyromegaly present.  Cardiovascular: Normal rate, regular rhythm and normal heart sounds.   No murmur heard. Respiratory: Effort normal and breath sounds normal.  GI: Soft. She exhibits no distension and no mass.  Musculoskeletal: She exhibits no edema.  Lymphadenopathy:    She has no cervical adenopathy.  Neurological: She is alert.  Skin: Skin  is warm and dry.     Assessment/Plan Average risk screening colonoscopy.  Terris Bodin U 05/07/2011, 9:40 AM

## 2011-05-09 ENCOUNTER — Encounter (HOSPITAL_COMMUNITY): Payer: Self-pay | Admitting: Internal Medicine

## 2011-05-14 ENCOUNTER — Encounter (INDEPENDENT_AMBULATORY_CARE_PROVIDER_SITE_OTHER): Payer: Self-pay | Admitting: *Deleted

## 2011-05-16 ENCOUNTER — Telehealth: Payer: Self-pay | Admitting: Family Medicine

## 2011-05-16 NOTE — Telephone Encounter (Signed)
Pt advised to use sudafed one to two daily to reduce post nasal drainage and therefore the cough, denies fever, chills or green drainage, she is to continue allegra

## 2011-05-16 NOTE — Telephone Encounter (Signed)
Please advise 

## 2011-05-20 ENCOUNTER — Ambulatory Visit (INDEPENDENT_AMBULATORY_CARE_PROVIDER_SITE_OTHER): Payer: BC Managed Care – PPO | Admitting: General Surgery

## 2011-05-20 ENCOUNTER — Other Ambulatory Visit (HOSPITAL_BASED_OUTPATIENT_CLINIC_OR_DEPARTMENT_OTHER): Payer: BC Managed Care – PPO | Admitting: Lab

## 2011-05-20 ENCOUNTER — Encounter (INDEPENDENT_AMBULATORY_CARE_PROVIDER_SITE_OTHER): Payer: Self-pay | Admitting: General Surgery

## 2011-05-20 VITALS — BP 117/66 | HR 81 | Temp 98.0°F | Ht 65.0 in | Wt 168.0 lb

## 2011-05-20 DIAGNOSIS — M129 Arthropathy, unspecified: Secondary | ICD-10-CM

## 2011-05-20 DIAGNOSIS — M1990 Unspecified osteoarthritis, unspecified site: Secondary | ICD-10-CM

## 2011-05-20 DIAGNOSIS — Z853 Personal history of malignant neoplasm of breast: Secondary | ICD-10-CM

## 2011-05-20 DIAGNOSIS — C50919 Malignant neoplasm of unspecified site of unspecified female breast: Secondary | ICD-10-CM

## 2011-05-20 LAB — CBC WITH DIFFERENTIAL/PLATELET
BASO%: 0.3 % (ref 0.0–2.0)
Basophils Absolute: 0 10*3/uL (ref 0.0–0.1)
EOS%: 4.2 % (ref 0.0–7.0)
HGB: 12.1 g/dL (ref 11.6–15.9)
MCH: 27.7 pg (ref 25.1–34.0)
MCHC: 32.3 g/dL (ref 31.5–36.0)
MCV: 85.8 fL (ref 79.5–101.0)
MONO%: 9.9 % (ref 0.0–14.0)
RBC: 4.38 10*6/uL (ref 3.70–5.45)
RDW: 14.9 % — ABNORMAL HIGH (ref 11.2–14.5)
lymph#: 1.1 10*3/uL (ref 0.9–3.3)

## 2011-05-20 NOTE — Progress Notes (Signed)
Operation:  Right MRM, Left SM for Right breast CA  Date:  06/27/2010  Stage: T1cN2  Hormone receptor status:  Positive  HPI:  Mrs. Sweeny is here for followup of her right breast cancer. .  She denies any chest wall nodules or adenopathy.  No swelling of right arm.  She switched from Femara to Tamoxifen and is tolerating it better.  PE:  Gen-she looks well  Chest:  Right side demonstrates XRT changes to the skin; no nodules. Left side demonstrates no nodules.  Lymph nodes:  No palpable cervical, supraclavicular, or axillary adenopathy.   Assessment:  Right breast cancer- no clinical evidence of recurrence.  On Tamoxifen now.  Plan:  Return visit in 3  Months.

## 2011-05-20 NOTE — Patient Instructions (Signed)
Call us if you feel any nodules on your chest wall.

## 2011-05-21 LAB — COMPREHENSIVE METABOLIC PANEL
AST: 14 U/L (ref 0–37)
Alkaline Phosphatase: 92 U/L (ref 39–117)
BUN: 10 mg/dL (ref 6–23)
Glucose, Bld: 84 mg/dL (ref 70–99)
Potassium: 3.9 mEq/L (ref 3.5–5.3)
Sodium: 142 mEq/L (ref 135–145)
Total Bilirubin: 0.3 mg/dL (ref 0.3–1.2)
Total Protein: 6 g/dL (ref 6.0–8.3)

## 2011-05-21 LAB — CYCLIC CITRUL PEPTIDE ANTIBODY, IGG: Cyclic Citrullin Peptide Ab: <2 U/mL (ref 0.0–5.0)

## 2011-05-21 LAB — RHEUMATOID FACTOR: Rheumatoid fact SerPl-aCnc: <10 [IU]/mL

## 2011-05-27 ENCOUNTER — Ambulatory Visit (HOSPITAL_BASED_OUTPATIENT_CLINIC_OR_DEPARTMENT_OTHER): Payer: BC Managed Care – PPO | Admitting: Oncology

## 2011-05-27 ENCOUNTER — Telehealth: Payer: Self-pay | Admitting: *Deleted

## 2011-05-27 VITALS — BP 99/59 | HR 68 | Temp 98.2°F | Ht 65.0 in | Wt 172.3 lb

## 2011-05-27 DIAGNOSIS — Z170 Estrogen receptor positive status [ER+]: Secondary | ICD-10-CM

## 2011-05-27 DIAGNOSIS — C50519 Malignant neoplasm of lower-outer quadrant of unspecified female breast: Secondary | ICD-10-CM

## 2011-05-27 DIAGNOSIS — C50919 Malignant neoplasm of unspecified site of unspecified female breast: Secondary | ICD-10-CM

## 2011-05-27 DIAGNOSIS — C773 Secondary and unspecified malignant neoplasm of axilla and upper limb lymph nodes: Secondary | ICD-10-CM

## 2011-05-27 MED ORDER — TAMOXIFEN CITRATE 20 MG PO TABS: 20.0000 mg | ORAL_TABLET | Freq: Every day | ORAL | Status: DC

## 2011-05-27 NOTE — Telephone Encounter (Signed)
gave patient appointment for 11-27-2011 starting at 2:30pm printed out calendar and  gave to the patient

## 2011-05-27 NOTE — Progress Notes (Signed)
ID: Gina Nelson   DOB: [birth year]  MR#: [id number]  NWG#:[id number]  HISTORY OF PRESENT ILLNESS: The patient herself noted a mass in her right breast.  She brought it to Dr. Anthony Sar attention and worked her up for diagnostic mammography and ultrasonography December 29.  Dr. Judyann Munson was able to palpate a discrete mass in the right breast associated with skin thickening.  By mammography, there was an obscured mass associated with distortion in that area by ultrasound.  The mass was irregular and hypoechoic measuring 1.9 cm.  In the right axilla, there was an enlarged lymph node measuring 2.1 cm.  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  This appeared to be low grade and strongly ER positive at 100%, PR was 34%, the MIB-1 was 41% and there was no evidence of HER2/neu amplification by CISH with a ratio of 1.58.  With this information, the patient was referred to Dr. Abbey Chatters and bilateral breast MRIs were obtained on January 11.  The MRI found a 4.9 cm lobulated, irregular mass with spiculated margins in the right breast at the 7 o'clock location with some skin and trabecular enhancement. There was linear enhancement extending to the nipple, but no apparent extension to the pectoralis muscle.  There were two lymph nodes noted in the right axilla, one measuring 8 mm, the other 6 mm.  There was no internal mammary adenopathy noted and the left breast was unremarkable.  Her subsequent history is as detailed below  INTERVAL HISTORY: Gina Nelson returns today for followup of her breast cancer. She is doing "fabulous". She enjoys her work but she is planning to retire when she turned 62 February of 2014. Her sister, who has a history of pancreatic cancer, is also doing "great".  REVIEW OF SYSTEMS: All the  arthralgias and myalgias that she was experiencing, to the point that I thought she might be developing rheumatoid arthritis, have pretty much resolved as she got off the letrozole. Her rheumatoid factor and cyclic citrulline peptide antibody were both negative. She is having no problems with hot flashes, vaginal wetness, or other issues. The only complaint she has today is her right knee, which is status post replacement. A detailed review of systems was otherwise noncontributory  PAST MEDICAL HISTORY: Past Medical History  Diagnosis Date  . Arthritis     knee  . Blood transfusion   . Cancer     right breast T1cN2  . Allergy   . PONV (postoperative nausea and vomiting)     PAST SURGICAL HISTORY: Past Surgical History  Procedure Date  . Total knee arthroplasty 2011  . Abdominal hysterectomy 1984  . Port-a-cath removal 07/16/10  . Breast surgery right MRM, left simple mastectomy  . Breast surgery left mastectomy  . Colonoscopy 05/07/2011    Procedure: COLONOSCOPY;  Surgeon: Malissa Hippo, MD;  Location: [facility];  Service: Endoscopy;  Laterality: N/A;  830    FAMILY HISTORY Family History  Problem Relation Age of Onset  . Cancer Father     prostate  The patient's father had prostate cancer metastatic to bone. He died at the age of 27. There is no other history of cancer in the family to the patient's knowledge.  GYNECOLOGIC HISTORY: Menarche age 67. She stopped having periods of course with her hysterectomy in  1984. She is Gx, P1. She was 18 at the time of that delivery. She used hormone replacement for about two and a half years.  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 Her husband, Gina Nelson, is a retired IT sales professional and currently a Engineer, site. Daughter, Gina Nelson, keeps a home for handicapped girls. Her husband, Gina Nelson, works in the Medical laboratory scientific officer business. The patient has one granddaughter who is  17.    ADVANCED DIRECTIVES:  HEALTH MAINTENANCE: History  Substance Use Topics  . Smoking status: Never Smoker   . Smokeless tobacco: Never Used  . Alcohol Use: No   She had a colonoscopy in October, 2006 under Dr. Lionel December which showed only a small cecal polyp. She tells me her cholesterol is "fine." There is no history of tobacco or ETOH abuse. She had a bone density September 2012 which was normal. Her Pap smear is up to date   Allergies  Allergen Reactions  . Codeine Itching    Arms and face    Current Outpatient Prescriptions  Medication Sig Dispense Refill  . Bisacodyl (LAXATIVE PO) Take by mouth every other day.        . Calcium Carbonate-Vitamin D (CALCIUM-VITAMIN D) 600-200 MG-UNIT CAPS Take 2 capsules by mouth.      . fexofenadine-pseudoephedrine (ALLEGRA-D 24) 180-240 MG per 24 hr tablet Take 1 tablet by mouth daily.  30 tablet  2  . fluticasone (FLONASE) 50 MCG/ACT nasal spray Place 1 spray into the nose 2 (two) times daily as needed for rhinitis.  16 g  2  . IRON PO Take 1 tablet by mouth.       . letrozole (FEMARA) 2.5 MG tablet       . Potassium (POTASSIMIN PO) Take 10 mg by mouth.       . tamoxifen (NOLVADEX) 20 MG tablet Take 20 mg by mouth daily.        OBJECTIVE: Middle-aged [place] American woman who appears fit Filed Vitals:   05/27/11 1523  BP: 99/59  Pulse: 68  Temp: 98.2 F (36.8 C)     Body mass index is 28.67 kg/(m^2).    ECOG FS: 0  Sclerae unicteric Oropharynx clear No peripheral adenopathy Lungs no rales or rhonchi Heart regular rate and rhythm Abd benign MSK no focal spinal tenderness, no peripheral edema Neuro: nonfocal Breasts: Status post bilateral mastectomies. No evidence of local recurrence  LAB RESULTS: Lab Results  Component Value Date   WBC 5.1 05/20/2011   NEUTROABS 3.3 05/20/2011   HGB 12.1 05/20/2011   HCT 37.6 05/20/2011   MCV 85.8 05/20/2011   PLT 204 05/20/2011      Chemistry      Component Value Date/Time   NA 142  05/20/2011 0900   K 3.9 05/20/2011 0900   CL 106 05/20/2011 0900   CO2 29 05/20/2011 0900   BUN 10 05/20/2011 0900   CREATININE 0.80 05/20/2011 0900      Component Value Date/Time   CALCIUM 9.4 05/20/2011 0900   ALKPHOS 92 05/20/2011 0900   AST 14 05/20/2011 0900   ALT <8 05/20/2011 0900   BILITOT 0.3 05/20/2011 0900       Lab Results  Component Value Date   LABCA2 20 09/17/2010   LABCA2 20 09/17/2010    No components found with this basename: [id number]    No results found for this basename: INR:1;PROTIME:1 in the last 168 hours  Urinalysis  Component Value Date/Time   COLORURINE YELLOW 05/24/2010 2049   APPEARANCEUR CLEAR 05/24/2010 2049   LABSPEC 1.008 05/24/2010 2049   PHURINE 6.0 05/24/2010 2049   GLUCOSEU NEGATIVE 05/24/2010 2049   HGBUR NEGATIVE 05/24/2010 2049   HGBUR negative 02/21/2009 1509   BILIRUBINUR NEGATIVE 05/24/2010 2049   KETONESUR 15* 05/24/2010 2049   PROTEINUR NEGATIVE 05/24/2010 2049   UROBILINOGEN 0.2 05/24/2010 2049   NITRITE NEGATIVE 05/24/2010 2049   LEUKOCYTESUR NEGATIVE MICROSCOPIC NOT DONE ON URINES WITH NEGATIVE PROTEIN, BLOOD, LEUKOCYTES, NITRITE, OR GLUCOSE <1000 mg/dL. 05/24/2010 2049    STUDIES: No new results found.  ASSESSMENT: 61 year old read [place] woman status post right breast and axillary lymph node biopsy January 2012, both positive for an invasive ductal carcinoma, grade 3, measuring 4.9 cm by MRI, and so stage IIB/IIIA clinically. The tumor was ER positive, PR positive, HER2 negative, with an elevated MIB-1.   (1) She received neoadjuvant docetaxel/cyclophosphamide x4 followed by doxorubicin/ cyclophosphamide x1, at which point neoadjuvant treatment was discontinued because of poor tolerance  (2) she underwent right modified radical mastectomy with left prophylactic mastectomy June 2012. The left side was benign. On the right, she had 2 areas of residual tumor, one measuring 1.1 cm, the other less than a centimeter, both grade 3, involving 4 out of 13  lymph nodes sampled. Repeat HER2 was again negative, and repeat estrogen and progesterone receptors were again positive at 94% and 83%, respectively.   (3) adjuvant radiation treatments completed September of 2012, at which time she started letrozole, discontinued because of arthralgias/myalgias  (4) tamoxifen started February 2013, with good tolerance   PLAN: She is doing very well on the tamoxifen and the plan is going to be to continue her on that medication for 5 years, then reassess. She has gone off the potassium, and keeps a good potassium level. I have encouraged her to eat more fruit, which should help. The iron replacement is constipating her. I have suggested she discontinue that as soon as she runs through her current supply. She was pain $12 a month for tamoxifen and I believe she will be able to get it for less than $3 a month I she goes through [employer], to which she already belongs. She will let us know in the next visit, which will be in 6 months. Otherwise she knows to call for any problems that may develop before then.   Brittanny Levenhagen C    05/27/2011

## 2011-05-29 ENCOUNTER — Other Ambulatory Visit: Payer: BC Managed Care – PPO | Admitting: Lab

## 2011-06-02 ENCOUNTER — Telehealth: Payer: Self-pay | Admitting: *Deleted

## 2011-06-02 NOTE — Telephone Encounter (Signed)
Pt. Called.  Her dentist is treating her sensitive tooth with floride.  If is doesn't work, he may have to pull the tooth.  Patient will fax [place] release form for Dr. Darnelle Catalan to give permission to have her tooth pulled.

## 2011-06-05 ENCOUNTER — Ambulatory Visit: Payer: BC Managed Care – PPO | Admitting: Oncology

## 2011-06-26 ENCOUNTER — Other Ambulatory Visit: Payer: BC Managed Care – PPO | Admitting: Lab

## 2011-07-03 ENCOUNTER — Ambulatory Visit: Payer: BC Managed Care – PPO | Admitting: Oncology

## 2011-10-17 ENCOUNTER — Other Ambulatory Visit: Payer: Self-pay

## 2011-10-17 ENCOUNTER — Telehealth: Payer: Self-pay | Admitting: Family Medicine

## 2011-10-17 MED ORDER — FLUCONAZOLE 150 MG PO TABS: 150.0000 mg | ORAL_TABLET | Freq: Once | ORAL | Status: DC

## 2011-10-17 NOTE — Telephone Encounter (Signed)
Please advise 

## 2011-10-17 NOTE — Telephone Encounter (Signed)
Med sent and patient aware 

## 2011-10-17 NOTE — Telephone Encounter (Signed)
pls send in fluconazole 150mg  #1 only and let her know

## 2011-11-15 IMAGING — CR DG KNEE 1-2V PORT*R*
2 series · 2 of 2 positions shown · non-contrast
Comparison: None.

CLINICAL DATA: Postop right knee.

PORTABLE RIGHT KNEE - 1-2 VIEW

[view not recorded (1 of 2)]
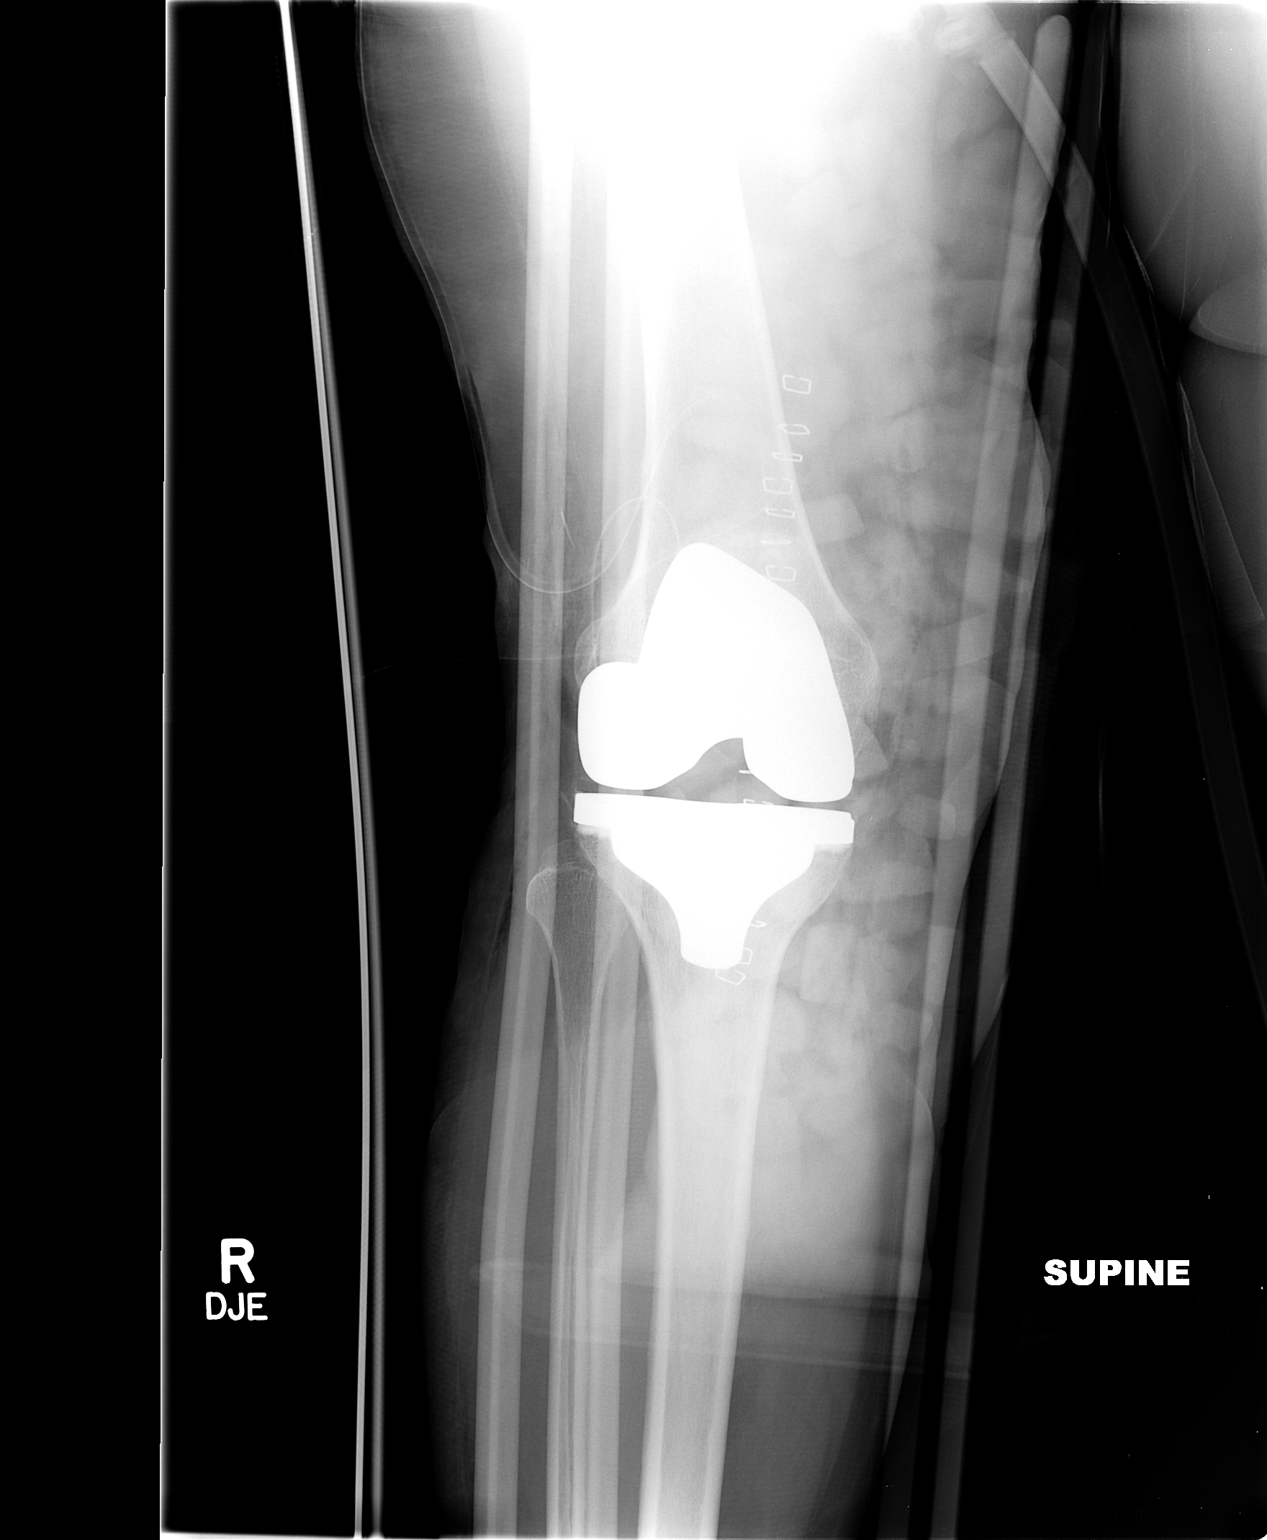

[view not recorded (2 of 2)]
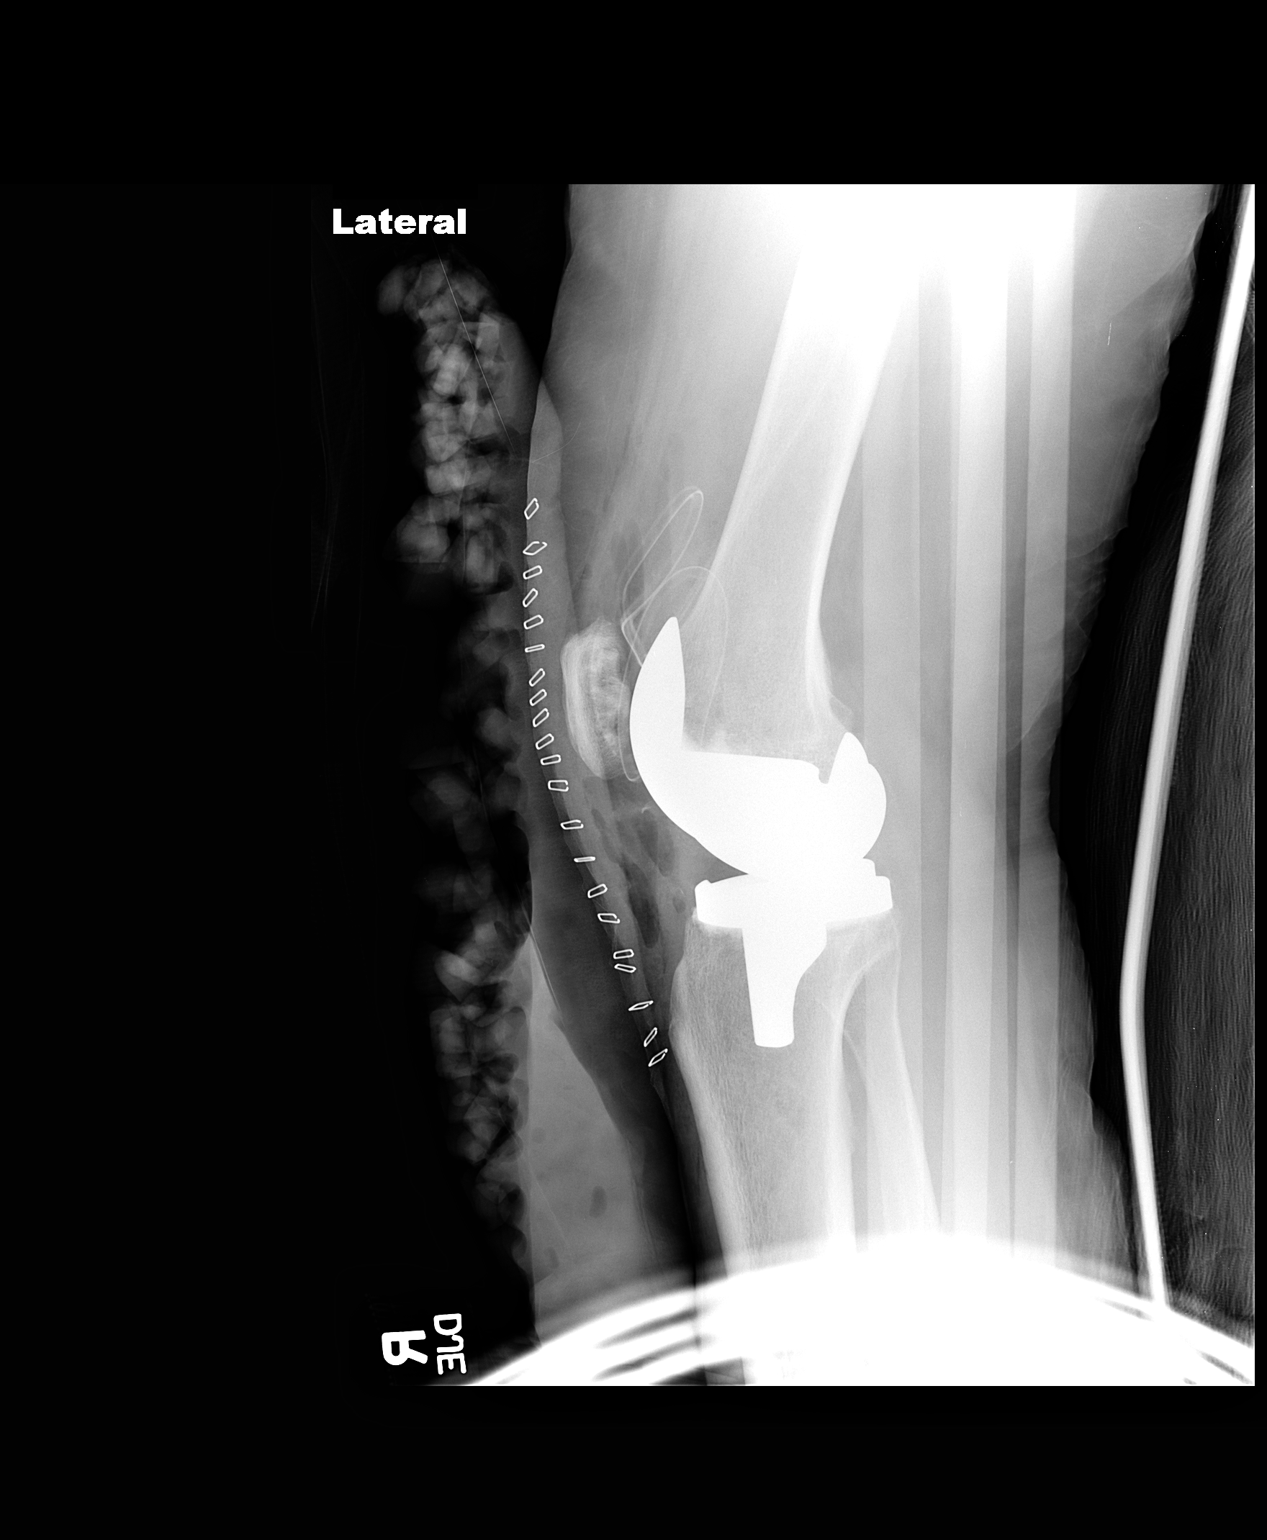

[2 of 2 positions shown; findings below may reference images not displayed]

FINDINGS: The patient is status post right knee arthroplasty.
Subcutaneous and joint air and fluid are noted.  Surgical drain and
skin staples are in place.
IMPRESSION: Right total knee arthroplasty with expected postoperative findings.

## 2011-11-17 ENCOUNTER — Ambulatory Visit (INDEPENDENT_AMBULATORY_CARE_PROVIDER_SITE_OTHER): Payer: BC Managed Care – PPO | Admitting: General Surgery

## 2011-11-17 VITALS — BP 122/70 | HR 72 | Temp 97.4°F | Resp 18 | Ht 65.0 in | Wt 177.6 lb

## 2011-11-17 DIAGNOSIS — Z853 Personal history of malignant neoplasm of breast: Secondary | ICD-10-CM

## 2011-11-17 NOTE — Progress Notes (Signed)
Operation:  Right MRM, Left SM for Right breast CA  Date:  06/27/2010  Stage: T1cN2  Hormone receptor status:  Positive  HPI:  Gina Nelson is here for followup of her right breast cancer. .  She denies any chest wall nodules or adenopathy.  No swelling of right arm.  She is tolerating her Tamoxifen.  She is going to retire February 2014.  PE:  Gen-she looks well  Chest:  Right side demonstrates XRT changes to the skin; no nodules. Left side demonstrates no nodules.  Lymph nodes:  No palpable cervical, supraclavicular, or axillary adenopathy.   Assessment:  Right breast cancer- no clinical evidence of recurrence.  On Tamoxifen and tolerating it well.  Plan:  Return visit in 3  Months.

## 2011-11-17 NOTE — Patient Instructions (Signed)
Call if you find any new nodules on your chest wall. 

## 2011-11-27 ENCOUNTER — Other Ambulatory Visit (HOSPITAL_BASED_OUTPATIENT_CLINIC_OR_DEPARTMENT_OTHER): Payer: BC Managed Care – PPO | Admitting: Lab

## 2011-11-27 ENCOUNTER — Telehealth: Payer: Self-pay | Admitting: *Deleted

## 2011-11-27 ENCOUNTER — Ambulatory Visit (HOSPITAL_BASED_OUTPATIENT_CLINIC_OR_DEPARTMENT_OTHER): Payer: BC Managed Care – PPO | Admitting: Oncology

## 2011-11-27 VITALS — BP 128/76 | HR 74 | Temp 98.6°F | Resp 20 | Ht 65.0 in | Wt 176.7 lb

## 2011-11-27 DIAGNOSIS — C773 Secondary and unspecified malignant neoplasm of axilla and upper limb lymph nodes: Secondary | ICD-10-CM

## 2011-11-27 DIAGNOSIS — Z170 Estrogen receptor positive status [ER+]: Secondary | ICD-10-CM

## 2011-11-27 DIAGNOSIS — D649 Anemia, unspecified: Secondary | ICD-10-CM

## 2011-11-27 DIAGNOSIS — C50919 Malignant neoplasm of unspecified site of unspecified female breast: Secondary | ICD-10-CM

## 2011-11-27 DIAGNOSIS — C50519 Malignant neoplasm of lower-outer quadrant of unspecified female breast: Secondary | ICD-10-CM

## 2011-11-27 LAB — COMPREHENSIVE METABOLIC PANEL (CC13)
Albumin: 3.5 g/dL (ref 3.5–5.0)
BUN: 9 mg/dL (ref 7.0–26.0)
CO2: 28 mEq/L (ref 22–29)
Calcium: 9.2 mg/dL (ref 8.4–10.4)
Chloride: 109 mEq/L — ABNORMAL HIGH (ref 98–107)
Glucose: 113 mg/dl — ABNORMAL HIGH (ref 70–99)
Potassium: 3.8 mEq/L (ref 3.5–5.1)

## 2011-11-27 LAB — CBC WITH DIFFERENTIAL/PLATELET
Basophils Absolute: 0 10*3/uL (ref 0.0–0.1)
HCT: 34.5 % — ABNORMAL LOW (ref 34.8–46.6)
HGB: 11.4 g/dL — ABNORMAL LOW (ref 11.6–15.9)
LYMPH%: 19.3 % (ref 14.0–49.7)
MONO#: 0.6 10*3/uL (ref 0.1–0.9)
NEUT%: 69.6 % (ref 38.4–76.8)
Platelets: 177 10*3/uL (ref 145–400)
WBC: 6.7 10*3/uL (ref 3.9–10.3)
lymph#: 1.3 10*3/uL (ref 0.9–3.3)

## 2011-11-27 NOTE — Telephone Encounter (Signed)
Gave patient appointment for 2014 

## 2011-11-27 NOTE — Progress Notes (Signed)
ID: Gina Nelson   DOB: 03-Dec-1950  MR#: [id number]  BMW#:[id number]  HISTORY OF PRESENT ILLNESS: The patient herself noted a mass in her right breast.  She brought it to Dr. Anthony Sar attention and worked her up for diagnostic mammography and ultrasonography December 29.  Dr. Judyann Munson was able to palpate a discrete mass in the right breast associated with skin thickening.  By mammography, there was an obscured mass associated with distortion in that area by ultrasound.  The mass was irregular and hypoechoic measuring 1.9 cm.  In the right axilla, there was an enlarged lymph node measuring 2.1 cm.  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  This appeared to be low grade and strongly ER positive at 100%, PR was 34%, the MIB-1 was 41% and there was no evidence of HER2/neu amplification by CISH with a ratio of 1.58.  With this information, the patient was referred to Dr. Abbey Chatters and bilateral breast MRIs were obtained on January 11.  The MRI found a 4.9 cm lobulated, irregular mass with spiculated margins in the right breast at the 7 o'clock location with some skin and trabecular enhancement. There was linear enhancement extending to the nipple, but no apparent extension to the pectoralis muscle.  There were two lymph nodes noted in the right axilla, one measuring 8 mm, the other 6 mm.  There was no internal mammary adenopathy noted and the left breast was unremarkable.  Her subsequent history is as detailed below  INTERVAL HISTORY: Gina Nelson returns today for followup of her breast cancer. She is doing "fabulous". She enjoys her work but she is planning to retire when she turned 62 February of 2014. She hopes to be able to go on mission trips with her husband after that.  REVIEW OF SYSTEMS: She is having no  symptoms from the tamoxifen, and she is now getting it at [facility] for less than $25 a three-month supply.  She has a little right knee problem which she says is the reason she doesn't walk more. She does have a stationary bike at home but doesn't use it. She's gained a little bit of weight. We discussed all this at length today. Otherwise a detailed review of systems was noncontributory.   PAST MEDICAL HISTORY: Past Medical History  Diagnosis Date  . Arthritis     knee  . Blood transfusion   . Cancer     right breast T1cN2  . Allergy   . PONV (postoperative nausea and vomiting)     PAST SURGICAL HISTORY: Past Surgical History  Procedure Date  . Total knee arthroplasty 2011  . Abdominal hysterectomy 1984  . Port-a-cath removal 07/16/10  . Breast surgery right MRM, left simple mastectomy  . Breast surgery left mastectomy  . Colonoscopy 05/07/2011    Procedure: COLONOSCOPY;  Surgeon: Malissa Hippo, MD;  Location: [facility];  Service: Endoscopy;  Laterality: N/A;  830    FAMILY HISTORY Family History  Problem Relation Age of Onset  . Cancer Father     prostate  The patient's father had prostate cancer metastatic to bone. He died at the age of 43. There is no other history of cancer in the family to the patient's knowledge.  GYNECOLOGIC HISTORY: Menarche age 33. She stopped having periods of course with her hysterectomy in  1984. She is Gx, P1. She was 18 at the time of that delivery. She used hormone replacement for about two and a half years.  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 Her husband, Courtlyn Aki, is a retired IT sales professional and currently a Engineer, site. Daughter, Blasa Raisch, keeps a home for handicapped girls. Her husband, [name], works in the Medical laboratory scientific officer business. The patient has one granddaughter who is 66.    ADVANCED DIRECTIVES:  HEALTH MAINTENANCE: History  Substance Use Topics  . Smoking  status: Never Smoker   . Smokeless tobacco: Never Used  . Alcohol Use: No   She had a colonoscopy in October, 2006 under Dr. Lionel December which showed only a small cecal polyp. She tells me her cholesterol is "fine." There is no history of tobacco or ETOH abuse. She had a bone density September 2012 which was normal. Her Pap smear is up to date   Allergies  Allergen Reactions  . Codeine Itching    Arms and face    Current Outpatient Prescriptions  Medication Sig Dispense Refill  . Bisacodyl (LAXATIVE PO) Take by mouth every other day.        . tamoxifen (NOLVADEX) 20 MG tablet Take 1 tablet (20 mg total) by mouth daily.  90 tablet  12    OBJECTIVE: Middle-aged [place] American woman who appears well Filed Vitals:   11/27/11 1507  BP: 128/76  Pulse: 74  Temp: 98.6 F (37 C)  Resp: 20     Body mass index is 29.40 kg/(m^2).    ECOG FS: 0  Sclerae unicteric Oropharynx clear No peripheral adenopathy Lungs no rales or rhonchi Heart regular rate and rhythm Abd benign MSK no focal spinal tenderness, no peripheral edema Neuro: nonfocal Breasts: Status post bilateral mastectomies. No evidence of local recurrence  LAB RESULTS: Lab Results  Component Value Date   WBC 6.7 11/27/2011   NEUTROABS 4.7 11/27/2011   HGB 11.4* 11/27/2011   HCT 34.5* 11/27/2011   MCV 85.5 11/27/2011   PLT 177 11/27/2011      Chemistry      Component Value Date/Time   NA 142 05/20/2011 0900   K 3.9 05/20/2011 0900   CL 106 05/20/2011 0900   CO2 29 05/20/2011 0900   BUN 10 05/20/2011 0900   CREATININE 0.80 05/20/2011 0900      Component Value Date/Time   CALCIUM 9.4 05/20/2011 0900   ALKPHOS 92 05/20/2011 0900   AST 14 05/20/2011 0900   ALT <8 05/20/2011 0900   BILITOT 0.3 05/20/2011 0900       Lab Results  Component Value Date   LABCA2 20 09/17/2010   LABCA2 20 09/17/2010    No components found with this basename: [id number]    No results found for this basename: INR:1;PROTIME:1 in the last 168  hours  Urinalysis    Component Value Date/Time   COLORURINE YELLOW 05/24/2010 2049   APPEARANCEUR CLEAR 05/24/2010 2049   LABSPEC 1.008 05/24/2010 2049   PHURINE 6.0 05/24/2010 2049   GLUCOSEU NEGATIVE 05/24/2010 2049   HGBUR NEGATIVE 05/24/2010 2049   HGBUR negative 02/21/2009 1509   BILIRUBINUR NEGATIVE 05/24/2010 2049   KETONESUR 15* 05/24/2010 2049   PROTEINUR NEGATIVE 05/24/2010 2049   UROBILINOGEN 0.2 05/24/2010 2049   NITRITE NEGATIVE 05/24/2010 2049   LEUKOCYTESUR NEGATIVE MICROSCOPIC NOT DONE ON URINES WITH NEGATIVE PROTEIN, BLOOD, LEUKOCYTES, NITRITE, OR GLUCOSE <1000 mg/dL. 05/24/2010 2049    STUDIES: No new  results found.  ASSESSMENT: 61 year old read [place] woman status post right breast and axillary lymph node biopsy January 2012, both positive for an invasive ductal carcinoma, grade 3, measuring 4.9 cm by MRI, and so stage IIB/IIIA clinically. The tumor was ER positive, PR positive, HER2 negative, with an elevated MIB-1.   (1) She received neoadjuvant docetaxel/cyclophosphamide x4 followed by doxorubicin/ cyclophosphamide x1, at which point neoadjuvant treatment was discontinued because of poor tolerance  (2) she underwent right modified radical mastectomy with left prophylactic mastectomy June 2012. The left side was benign. On the right, she had 2 areas of residual tumor, one measuring 1.1 cm, the other less than a centimeter, both grade 3, involving 4 out of 13 lymph nodes sampled. Repeat HER2 was again negative, and repeat estrogen and progesterone receptors were again positive at 94% and 83%, respectively.   (3) adjuvant radiation treatments completed September of 2012, at which time she started letrozole, discontinued because of arthralgias/myalgias  (4) tamoxifen started February 2013, with good tolerance   PLAN: Labs show minimal anemia her MCV is normal. I have encouraged her to move her exercise bike to the dens so she can do a little cycling while watching TV. I also  have encouraged her to resume her stretching exercises since the skin on her anterior chest where she had the mastectomies is getting a bit tight again. There is no erythema or other findings of concern of course. She will see [place] again in one year, and we will do lab work that same day. She knows to call for any problems that may develop before then. The overall plan is to continue tamoxifen most likely for a total of 10 years.   MAGRINAT,GUSTAV C    11/27/2011

## 2012-02-18 ENCOUNTER — Encounter (INDEPENDENT_AMBULATORY_CARE_PROVIDER_SITE_OTHER): Payer: BC Managed Care – PPO | Admitting: General Surgery

## 2012-02-20 ENCOUNTER — Encounter: Payer: BC Managed Care – PPO | Admitting: Family Medicine

## 2012-02-23 ENCOUNTER — Other Ambulatory Visit (HOSPITAL_COMMUNITY)
Admission: RE | Admit: 2012-02-23 | Discharge: 2012-02-23 | Disposition: A | Discharge status: Home / Self Care | Payer: BC Managed Care – PPO | Source: Ambulatory Visit | Attending: Family Medicine | Admitting: Family Medicine

## 2012-02-23 ENCOUNTER — Ambulatory Visit (INDEPENDENT_AMBULATORY_CARE_PROVIDER_SITE_OTHER): Payer: BC Managed Care – PPO | Admitting: Family Medicine

## 2012-02-23 ENCOUNTER — Encounter: Payer: Self-pay | Admitting: Family Medicine

## 2012-02-23 VITALS — BP 108/70 | HR 85 | Resp 16 | Ht 65.0 in | Wt 177.0 lb

## 2012-02-23 DIAGNOSIS — Z01419 Encounter for gynecological examination (general) (routine) without abnormal findings: Secondary | ICD-10-CM | POA: Insufficient documentation

## 2012-02-23 DIAGNOSIS — Z2911 Encounter for prophylactic immunotherapy for respiratory syncytial virus (RSV): Secondary | ICD-10-CM

## 2012-02-23 DIAGNOSIS — R5383 Other fatigue: Secondary | ICD-10-CM

## 2012-02-23 DIAGNOSIS — Z124 Encounter for screening for malignant neoplasm of cervix: Secondary | ICD-10-CM

## 2012-02-23 DIAGNOSIS — Z1151 Encounter for screening for human papillomavirus (HPV): Secondary | ICD-10-CM | POA: Insufficient documentation

## 2012-02-23 DIAGNOSIS — Z13220 Encounter for screening for lipoid disorders: Secondary | ICD-10-CM

## 2012-02-23 DIAGNOSIS — R7301 Impaired fasting glucose: Secondary | ICD-10-CM

## 2012-02-23 DIAGNOSIS — Z0000 Encounter for general adult medical examination without abnormal findings: Secondary | ICD-10-CM | POA: Insufficient documentation

## 2012-02-23 DIAGNOSIS — Z23 Encounter for immunization: Secondary | ICD-10-CM

## 2012-02-23 DIAGNOSIS — E559 Vitamin D deficiency, unspecified: Secondary | ICD-10-CM

## 2012-02-23 DIAGNOSIS — Z1211 Encounter for screening for malignant neoplasm of colon: Secondary | ICD-10-CM

## 2012-02-23 LAB — BASIC METABOLIC PANEL
BUN: 9 mg/dL (ref 6–23)
CO2: 29 mEq/L (ref 19–32)
Calcium: 9.2 mg/dL (ref 8.4–10.5)
Creat: 0.78 mg/dL (ref 0.50–1.10)
Glucose, Bld: 86 mg/dL (ref 70–99)
Sodium: 143 mEq/L (ref 135–145)

## 2012-02-23 LAB — LIPID PANEL
Cholesterol: 166 mg/dL (ref 0–200)
HDL: 62 mg/dL (ref >39)
Triglycerides: 64 mg/dL (ref <150)

## 2012-02-23 LAB — HEMOGLOBIN A1C: Mean Plasma Glucose: 123 mg/dL — ABNORMAL HIGH (ref <117)

## 2012-02-23 NOTE — Patient Instructions (Addendum)
Annual exam in 1 year, call to be seen if you need me before   You have gained 16 pounds in the past year, aim to lose 10 by eating more fruit and vegetable.  It is important that you exercise regularly at least 30 minutes 5 to 7 times a week. If you develop chest pain, have severe difficulty breathing, or feel very tired, stop exercising immediately and seek medical attention   Fasting lipid, chem 7 and Vit D and TSH today and HBA1C   You need 1 multivitamin once daily, eg centrum  One aspirin 81mg  once daily for stroke risk reduction.  Calcium with D 1200mg /1000IU gel capsule one daily for bone health  Zostavax today

## 2012-02-23 NOTE — Progress Notes (Signed)
  Subjective:    Patient ID: Gina Nelson, female    DOB: 11/21/1950, 62 y.o.   MRN: [id number]  HPI The PT is here for annual exam and re-evaluation of chronic medical conditions, medication management and review of any available recent lab and radiology data.  Preventive health is updated, specifically  Cancer screening and Immunization.   Questions or concerns regarding consultations or procedures which the PT has had in the interim are  addressed.  There are no new concerns.  There are no specific complaints       Review of Systems See HPI Denies recent fever or chills. Denies sinus pressure, nasal congestion, ear pain or sore throat. Denies chest congestion, productive cough or wheezing. Denies chest pains, palpitations and leg swelling Denies abdominal pain, nausea, vomiting,diarrhea or constipation.   Denies dysuria, frequency, hesitancy or incontinence. Occasional pain in toes and left shoulder , no limitation in mobility Denies headaches, seizures, numbness, or tingling. Denies depression, anxiety or insomnia. Denies skin break down or rash.        Objective:   Physical Exam Pleasant well nourished female, alert and oriented x 3, in no cardio-pulmonary distress. Afebrile. HEENT No facial trauma or asymetry. Sinuses non tender.  EOMI, PERTL, fundoscopic exam is normal, no hemorhage or exudate.  External ears normal, tympanic membranes clear. Oropharynx moist, no exudate, good dentition. Neck: supple, no adenopathy,JVD or thyromegaly.No bruits.  Chest: Clear to ascultation bilaterally.No crackles or wheezes. Non tender to palpation  Breast: Bilateral mastectomy, well healed scars No axillary or supraclavicular adenopathy  Cardiovascular system; Heart sounds normal,  S1 and  S2 ,no S3.  No murmur, or thrill. Apical beat not displaced Peripheral pulses normal.  Abdomen: Soft, non tender, no organomegaly or masses. No bruits. Bowel sounds normal. No  guarding, tenderness or rebound.  Rectal:  No mass. Guaiac negative stool.  GU: External genitalia normal. No lesions. Vaginal canal normal.No discharge. Uterus absent, no adnexal masses, no adnexal tenderness.  Musculoskeletal exam: Full ROM of spine, hips , shoulders and knees. No deformity ,swelling or crepitus noted. No muscle wasting or atrophy.   Neurologic: Cranial nerves 2 to 12 intact. Power, tone ,sensation and reflexes normal throughout. No disturbance in gait. No tremor.  Skin: Intact, no ulceration, erythema , scaling or rash noted. Pigmentation normal throughout  Psych; Normal mood and affect. Judgement and concentration normal        Assessment & Plan:

## 2012-02-23 NOTE — Assessment & Plan Note (Signed)
Annual exam completed as documented. Pt has gained weight and does not exercise , needs to work on both. Needs to start preventive meds as outlined. Fasting labs and zostavax today

## 2012-02-23 NOTE — Addendum Note (Signed)
Addended by: Abner Greenspan on: 02/23/2012 09:58 AM   Modules accepted: Orders

## 2012-02-24 ENCOUNTER — Other Ambulatory Visit: Payer: Self-pay | Admitting: Family Medicine

## 2012-02-25 ENCOUNTER — Encounter (INDEPENDENT_AMBULATORY_CARE_PROVIDER_SITE_OTHER): Payer: Self-pay | Admitting: General Surgery

## 2012-02-25 ENCOUNTER — Ambulatory Visit (INDEPENDENT_AMBULATORY_CARE_PROVIDER_SITE_OTHER): Payer: BC Managed Care – PPO | Admitting: General Surgery

## 2012-02-25 VITALS — BP 108/76 | HR 89 | Temp 97.2°F | Resp 18 | Ht 65.0 in | Wt 177.6 lb

## 2012-02-25 DIAGNOSIS — Z853 Personal history of malignant neoplasm of breast: Secondary | ICD-10-CM

## 2012-02-25 NOTE — Patient Instructions (Signed)
Call if you feel any hard nodules in the chest wall.

## 2012-02-25 NOTE — Progress Notes (Signed)
Operation:  Right MRM, Left SM for Right breast CA  Date:  06/27/2010  Stage: T1cN2  Hormone receptor status:  Positive  HPI:  Gina Nelson is here for another followup of her right breast cancer. .  She denies any chest wall nodules or adenopathy.  No swelling of right arm.  She is tolerating her Tamoxifen.  She is going to retire later this month.  PE:  Gen-she looks well  Chest:  Right side demonstrates XRT changes to the skin; no nodules. Left side demonstrates no nodules.  Lymph nodes:  No palpable cervical, supraclavicular, or axillary adenopathy.   Assessment:  Right breast cancer- no clinical evidence of recurrence.  On Tamoxifen and tolerating it well.  Plan:  Return visit in 3  Months.

## 2012-02-27 ENCOUNTER — Telehealth: Payer: Self-pay | Admitting: Family Medicine

## 2012-03-02 ENCOUNTER — Telehealth: Payer: Self-pay | Admitting: Family Medicine

## 2012-03-02 NOTE — Telephone Encounter (Signed)
i believe she is calling back on her lab report, they have already been edited pls call pt back

## 2012-03-03 ENCOUNTER — Other Ambulatory Visit: Payer: Self-pay

## 2012-03-03 DIAGNOSIS — R7301 Impaired fasting glucose: Secondary | ICD-10-CM

## 2012-03-03 MED ORDER — ERGOCALCIFEROL 1.25 MG (50000 UT) PO CAPS: 50000.0000 [IU] | ORAL_CAPSULE | ORAL | Status: DC

## 2012-03-03 NOTE — Telephone Encounter (Signed)
Pt aware.

## 2012-03-03 NOTE — Telephone Encounter (Signed)
Patient aware.

## 2012-03-08 ENCOUNTER — Telehealth: Payer: Self-pay | Admitting: Family Medicine

## 2012-03-08 NOTE — Telephone Encounter (Signed)
Patient aware.

## 2012-03-08 NOTE — Telephone Encounter (Signed)
pls let pt know I do  not write for eyedrops with steroids, she will need to call the original prescriber

## 2012-04-23 ENCOUNTER — Telehealth: Payer: Self-pay

## 2012-04-23 MED ORDER — FLUTICASONE PROPIONATE 50 MCG/ACT NA SUSP: 1.0000 | Freq: Two times a day (BID) | NASAL | Status: AC | PRN

## 2012-04-23 NOTE — Telephone Encounter (Signed)
Pt aware.

## 2012-04-23 NOTE — Telephone Encounter (Signed)
States she is having the same problem she had last year with the clear sinus drainage and headache in the top of her head. Has been going on 2 days now and she has tried otc sinus products with no relief. She said you usually call her something in for this and wants to know if you will do that instead of having her come in but she will if she has to.  [phone] [facility] [place]

## 2012-04-23 NOTE — Telephone Encounter (Signed)
Since drainage is clear I recommend saline flush, claritin daily and sudafed one daily or one twuice daily over the next 3 to 5 days, if worsens will need OV Also If willing to use steroid nasal spray also for allergies pls send in flonase 1 puff daily #1 refill 2 and let her know

## 2012-06-08 ENCOUNTER — Encounter: Payer: Self-pay | Admitting: Family Medicine

## 2012-06-08 ENCOUNTER — Ambulatory Visit (INDEPENDENT_AMBULATORY_CARE_PROVIDER_SITE_OTHER): Payer: BC Managed Care – PPO | Admitting: Family Medicine

## 2012-06-08 ENCOUNTER — Ambulatory Visit (HOSPITAL_COMMUNITY)
Admission: RE | Admit: 2012-06-08 | Discharge: 2012-06-08 | Disposition: A | Discharge status: Home / Self Care | Payer: BC Managed Care – PPO | Source: Ambulatory Visit | Attending: Family Medicine | Admitting: Family Medicine

## 2012-06-08 VITALS — BP 104/68 | HR 100 | Temp 98.8°F | Resp 18 | Ht 65.0 in | Wt 171.0 lb

## 2012-06-08 DIAGNOSIS — E559 Vitamin D deficiency, unspecified: Secondary | ICD-10-CM

## 2012-06-08 DIAGNOSIS — R059 Cough, unspecified: Secondary | ICD-10-CM | POA: Insufficient documentation

## 2012-06-08 DIAGNOSIS — J209 Acute bronchitis, unspecified: Secondary | ICD-10-CM

## 2012-06-08 DIAGNOSIS — C50911 Malignant neoplasm of unspecified site of right female breast: Secondary | ICD-10-CM

## 2012-06-08 DIAGNOSIS — Z853 Personal history of malignant neoplasm of breast: Secondary | ICD-10-CM | POA: Insufficient documentation

## 2012-06-08 DIAGNOSIS — R05 Cough: Secondary | ICD-10-CM | POA: Insufficient documentation

## 2012-06-08 DIAGNOSIS — C50919 Malignant neoplasm of unspecified site of unspecified female breast: Secondary | ICD-10-CM

## 2012-06-08 DIAGNOSIS — J019 Acute sinusitis, unspecified: Secondary | ICD-10-CM

## 2012-06-08 LAB — CBC WITH DIFFERENTIAL/PLATELET
Eosinophils Relative: 3 % (ref 0–5)
Lymphocytes Relative: 12 % (ref 12–46)
Lymphs Abs: 1.5 10*3/uL (ref 0.7–4.0)
MCV: 85 fL (ref 78.0–100.0)
Neutrophils Relative %: 79 % — ABNORMAL HIGH (ref 43–77)
Platelets: 222 10*3/uL (ref 150–400)
RBC: 4.53 MIL/uL (ref 3.87–5.11)
WBC: 12.9 10*3/uL — ABNORMAL HIGH (ref 4.0–10.5)

## 2012-06-08 MED ORDER — CEFTRIAXONE SODIUM 1 G IJ SOLR
500.0000 mg | Freq: Once | INTRAMUSCULAR | Status: AC
  Administered 2012-06-08: 500 mg via INTRAMUSCULAR

## 2012-06-08 MED ORDER — METHYLPREDNISOLONE ACETATE 80 MG/ML IJ SUSP
80.0000 mg | Freq: Once | INTRAMUSCULAR | Status: AC
  Administered 2012-06-08: 80 mg via INTRAMUSCULAR

## 2012-06-08 MED ORDER — AZITHROMYCIN 250 MG PO TABS
ORAL_TABLET | ORAL | Status: AC
Start: 2012-06-08 — End: 2012-06-13

## 2012-06-08 MED ORDER — PROMETHAZINE-DM 6.25-15 MG/5ML PO SYRP: ORAL_SOLUTION | ORAL | Status: AC

## 2012-06-08 MED ORDER — PREDNISONE 5 MG PO TABS: 5.0000 mg | ORAL_TABLET | Freq: Two times a day (BID) | ORAL | Status: AC

## 2012-06-08 MED ORDER — BENZONATATE 100 MG PO CAPS: 100.0000 mg | ORAL_CAPSULE | Freq: Four times a day (QID) | ORAL | Status: DC | PRN

## 2012-06-08 NOTE — Patient Instructions (Addendum)
F/u as before, call if not improving, please take entire course of medication as prescribed.  You are being treated for acute bronchitis and sinusitis today.  CBC and diff stat  CXR today  Rocephin 500mg IiM in office and depo medrol 80mg  IM , to be followed by azithromycin, tessalon perles, and prednisone dose pack. Also cough suppressant syrup, which does have a sedative side effect

## 2012-06-08 NOTE — Progress Notes (Signed)
  Subjective:    Patient ID: Gina Nelson, female    DOB: 25-Mar-1950, 62 y.o.   MRN: [id number]  HPI 1 week h/o progressive head and chest congestion, feels weak, has had fever, chills and poor appetite. Prior to this she had been well   Review of Systems See HPI . Denies, ear pain has mild  sore throat. Denies chest congestion, productive cough or wheezing. Denies chest pains, palpitations and leg swelling Denies abdominal pain, nausea, vomiting,diarrhea or constipation.   Denies dysuria, frequency, hesitancy or incontinence. Denies joint pain, swelling and limitation in mobility.Generalized aches and pain s since illness Denies , seizures, numbness, or tingling. Denies depression, anxiety or insomnia. Denies skin break down or rash.        Objective:   Physical Exam  Patient alert and oriented and in no cardiopulmonary distress.ill appearing  HEENT: No facial asymmetry, EOMI, maxillary  sinus tenderness,  oropharynx mildly erythematous and moist.  Neck supple anterior cervical  adenopathy.  Chest: Few crackles and wheezes bilaterally, adequate air entry  CVS: S1, S2 no murmurs, no S3.  ABD: Soft non tender. Bowel sounds normal.  Ext: No edema  MS: Adequate ROM spine, shoulders, hips and knees.  Skin: Intact, no ulcerations or rash noted.  Psych: Good eye contact, normal affect. Memory intact not anxious or depressed appearing.  CNS: CN 2-12 intact, power, tone and sensation normal throughout.       Assessment & Plan:

## 2012-06-09 ENCOUNTER — Other Ambulatory Visit: Payer: Self-pay | Admitting: Family Medicine

## 2012-06-09 ENCOUNTER — Other Ambulatory Visit (HOSPITAL_COMMUNITY): Payer: Self-pay | Admitting: Oncology

## 2012-06-09 DIAGNOSIS — R9389 Abnormal findings on diagnostic imaging of other specified body structures: Secondary | ICD-10-CM

## 2012-06-09 DIAGNOSIS — C50919 Malignant neoplasm of unspecified site of unspecified female breast: Secondary | ICD-10-CM

## 2012-06-12 DIAGNOSIS — C50911 Malignant neoplasm of unspecified site of right female breast: Secondary | ICD-10-CM | POA: Insufficient documentation

## 2012-06-12 NOTE — Assessment & Plan Note (Signed)
Currently on tamoxifen 

## 2012-06-12 NOTE — Assessment & Plan Note (Addendum)
Antibiotic and decongestant prescribed, also steroid dose pack and cough suppressant

## 2012-06-12 NOTE — Assessment & Plan Note (Addendum)
Weekly vit D needs rept lab in September

## 2012-06-12 NOTE — Assessment & Plan Note (Signed)
Antibiotic prescribed 

## 2012-06-14 ENCOUNTER — Ambulatory Visit (HOSPITAL_COMMUNITY)
Admission: RE | Admit: 2012-06-14 | Discharge: 2012-06-14 | Disposition: A | Discharge status: Home / Self Care | Payer: BC Managed Care – PPO | Source: Ambulatory Visit | Attending: Family Medicine | Admitting: Family Medicine

## 2012-06-14 DIAGNOSIS — R9389 Abnormal findings on diagnostic imaging of other specified body structures: Secondary | ICD-10-CM

## 2012-06-14 DIAGNOSIS — Z853 Personal history of malignant neoplasm of breast: Secondary | ICD-10-CM | POA: Insufficient documentation

## 2012-06-14 DIAGNOSIS — R918 Other nonspecific abnormal finding of lung field: Secondary | ICD-10-CM | POA: Insufficient documentation

## 2012-06-14 DIAGNOSIS — C50919 Malignant neoplasm of unspecified site of unspecified female breast: Secondary | ICD-10-CM

## 2012-06-14 MED ORDER — IOHEXOL 300 MG/ML  SOLN
80.0000 mL | Freq: Once | INTRAMUSCULAR | Status: AC | PRN
  Administered 2012-06-14: 80 mL via INTRAVENOUS

## 2012-06-15 ENCOUNTER — Other Ambulatory Visit: Payer: Self-pay | Admitting: Oncology

## 2012-06-15 DIAGNOSIS — C50919 Malignant neoplasm of unspecified site of unspecified female breast: Secondary | ICD-10-CM

## 2012-06-18 ENCOUNTER — Other Ambulatory Visit: Payer: Self-pay | Admitting: Family Medicine

## 2012-06-18 ENCOUNTER — Telehealth: Payer: Self-pay | Admitting: Family Medicine

## 2012-06-18 MED ORDER — FLUCONAZOLE 150 MG PO TABS: ORAL_TABLET | ORAL | Status: DC

## 2012-06-18 NOTE — Telephone Encounter (Signed)
Please advise 

## 2012-06-18 NOTE — Telephone Encounter (Signed)
I am assuming this may be due to vaginal yeast infection following antibiotic course fluconazole is sent in  Pt aware

## 2012-06-22 ENCOUNTER — Telehealth: Payer: Self-pay | Admitting: Family Medicine

## 2012-06-22 NOTE — Telephone Encounter (Signed)
Patient out of town in conference - will be back in town for appt Monday June 16 @ 8AM

## 2012-06-22 NOTE — Telephone Encounter (Signed)
appt made

## 2012-06-22 NOTE — Telephone Encounter (Signed)
Can she have something else or needs OV?

## 2012-06-28 ENCOUNTER — Encounter: Payer: Self-pay | Admitting: Family Medicine

## 2012-06-28 ENCOUNTER — Ambulatory Visit (INDEPENDENT_AMBULATORY_CARE_PROVIDER_SITE_OTHER): Payer: BC Managed Care – PPO | Admitting: Family Medicine

## 2012-06-28 ENCOUNTER — Other Ambulatory Visit (HOSPITAL_COMMUNITY)
Admission: RE | Admit: 2012-06-28 | Discharge: 2012-06-28 | Disposition: A | Discharge status: Home / Self Care | Payer: BC Managed Care – PPO | Source: Ambulatory Visit | Attending: Family Medicine | Admitting: Family Medicine

## 2012-06-28 VITALS — BP 96/62 | HR 70 | Resp 18 | Ht 65.0 in | Wt 171.0 lb

## 2012-06-28 DIAGNOSIS — N760 Acute vaginitis: Secondary | ICD-10-CM | POA: Insufficient documentation

## 2012-06-28 DIAGNOSIS — Z113 Encounter for screening for infections with a predominantly sexual mode of transmission: Secondary | ICD-10-CM | POA: Insufficient documentation

## 2012-06-28 MED ORDER — FLUCONAZOLE 150 MG PO TABS
ORAL_TABLET | ORAL | Status: DC
Start: 2012-06-28 — End: 2013-11-22

## 2012-06-28 NOTE — Progress Notes (Signed)
  Subjective:    Patient ID: Gina Nelson, female    DOB: Jul 19, 1950, 62 y.o.   MRN: [id number]  HPI Thick white vaginal d/c since last week Monday, took fluconazole ,, still miserable , a lot of discharge with stinging on urination.   Review of Systems See HPI Denies recent fever or chills. Denies sinus pressure, nasal congestion, ear pain or sore throat. Denies chest congestion, productive cough or wheezing. Denies chest pains, palpitations and leg swelling Denies abdominal pain, nausea, vomiting,diarrhea or constipation.   Denies dysuria, frequency, hesitancy or incontinence. Denies joint pain, swelling and limitation in mobility. Denies headaches, seizures, numbness, or tingling. Denies depression, anxiety or insomnia. Denies skin break down or rash.        Objective:   Physical Exam  Patient alert and oriented and in no cardiopulmonary distress.  HEENT: No facial asymmetry, EOMI, no sinus tenderness,  oropharynx pink and moist.  Neck supple no adenopathy.  Chest: Clear to auscultation bilaterally.  CVS: S1, S2 no murmurs, no S3.  ABD: Soft non tender. Bowel sounds normal. Pelvic: clear vaginal d/c , vaginal mucosa appears healthy, uterus is absent Ext: No edema  MS: Adequate ROM spine, shoulders, hips and knees.  Skin: Intact, no ulcerations or rash noted.  Psych: Good eye contact, normal affect. Memory intact not anxious or depressed appearing.  CNS: CN 2-12 intact, power, tone and sensation normal throughout.       Assessment & Plan:

## 2012-06-28 NOTE — Patient Instructions (Addendum)
F/u as before  You are treated for presumed vaginal yeast infection, 3 fluconazole tablets have been sent to your pharmacy, take one daily  You will be contacted with resultas of your swabs.  The discharge appears normal on exam

## 2012-06-29 ENCOUNTER — Telehealth: Payer: Self-pay | Admitting: Family Medicine

## 2012-06-29 NOTE — Telephone Encounter (Signed)
Advised they might not come back for 2-3 days

## 2012-07-12 ENCOUNTER — Encounter: Payer: Self-pay | Admitting: Family Medicine

## 2012-07-12 ENCOUNTER — Ambulatory Visit (HOSPITAL_COMMUNITY)
Admission: RE | Admit: 2012-07-12 | Discharge: 2012-07-12 | Disposition: A | Discharge status: Home / Self Care | Payer: BC Managed Care – PPO | Source: Ambulatory Visit | Attending: Family Medicine | Admitting: Family Medicine

## 2012-07-12 ENCOUNTER — Ambulatory Visit (INDEPENDENT_AMBULATORY_CARE_PROVIDER_SITE_OTHER): Payer: BC Managed Care – PPO | Admitting: Family Medicine

## 2012-07-12 VITALS — BP 112/70 | HR 84 | Resp 18 | Ht 65.0 in | Wt 168.0 lb

## 2012-07-12 DIAGNOSIS — S5290XA Unspecified fracture of unspecified forearm, initial encounter for closed fracture: Secondary | ICD-10-CM

## 2012-07-12 DIAGNOSIS — M25521 Pain in right elbow: Secondary | ICD-10-CM | POA: Insufficient documentation

## 2012-07-12 DIAGNOSIS — M25529 Pain in unspecified elbow: Secondary | ICD-10-CM

## 2012-07-12 DIAGNOSIS — S5291XA Unspecified fracture of right forearm, initial encounter for closed fracture: Secondary | ICD-10-CM | POA: Insufficient documentation

## 2012-07-12 DIAGNOSIS — W19XXXA Unspecified fall, initial encounter: Secondary | ICD-10-CM | POA: Insufficient documentation

## 2012-07-12 DIAGNOSIS — M79609 Pain in unspecified limb: Secondary | ICD-10-CM

## 2012-07-12 DIAGNOSIS — S52123A Displaced fracture of head of unspecified radius, initial encounter for closed fracture: Secondary | ICD-10-CM | POA: Insufficient documentation

## 2012-07-12 DIAGNOSIS — M79631 Pain in right forearm: Secondary | ICD-10-CM

## 2012-07-12 NOTE — Patient Instructions (Addendum)
F/u as before  You need to get an xray of your right elbow and forearm today, since you have pain and limited mobility since your recent fall.  You will be referred to orthopedic Doc of your choice if you do have a fracture.  If you have no fracture use tylenol 325mg  one tablet every 8 hours for pain relief. If not better in 1 week and you still have pain and limited mobility call, so you can be referred to orthopedic specialist. If worsens, please call before

## 2012-07-13 ENCOUNTER — Telehealth: Payer: Self-pay | Admitting: Orthopedic Surgery

## 2012-07-13 ENCOUNTER — Encounter: Payer: Self-pay | Admitting: Orthopedic Surgery

## 2012-07-13 ENCOUNTER — Ambulatory Visit (INDEPENDENT_AMBULATORY_CARE_PROVIDER_SITE_OTHER): Payer: BC Managed Care – PPO | Admitting: Orthopedic Surgery

## 2012-07-13 VITALS — BP 122/78 | Ht 65.0 in | Wt 168.0 lb

## 2012-07-13 DIAGNOSIS — S52121A Displaced fracture of head of right radius, initial encounter for closed fracture: Secondary | ICD-10-CM

## 2012-07-13 DIAGNOSIS — S52123A Displaced fracture of head of unspecified radius, initial encounter for closed fracture: Secondary | ICD-10-CM

## 2012-07-13 MED ORDER — HYDROCODONE-ACETAMINOPHEN 5-325 MG PO TABS: 1.0000 | ORAL_TABLET | Freq: Four times a day (QID) | ORAL | Status: DC | PRN

## 2012-07-13 NOTE — Telephone Encounter (Signed)
Patient called, states would like to shower, okay to do so? If so, remove wrap?  Please call to advise. Ph # [phone].

## 2012-07-13 NOTE — Progress Notes (Signed)
Patient ID: Gina Nelson, female   DOB: 1950/04/30, 62 y.o.   MRN: [id number] Chief Complaint  Patient presents with  . Elbow Pain    Right elbow fracture. DOI 07-11-12. Referral from Dr. Lodema Hong.    BP 122/78  Ht 5\' 5"  (1.651 m)  Wt 168 lb (76.204 kg)  BMI 27.96 kg/m2  History broken right arm, date of injury June 29, symptoms: Dull pain. 8/10 pain. Constant pain. Symptoms better with immobilization. Symptoms worse with trying to hold her arm up. Patient fell in her yard. Review of systems 14 systems reviewed only positive finding was seasonal allergies  The past, family history and social history have been reviewed and are recorded in the corresponding sections of epic   BP 122/78  Ht 5\' 5"  (1.651 m)  Wt 168 lb (76.204 kg)  BMI 27.96 kg/m2 Overall appearance is normal, she is oriented x3. Her mood and affect are normal. She ablated without assistive device. She is tenderness over the radial head mild swelling of the elbow with loss of motion. Her range of motion is now 20-110 flexion extension arc. Stability cannot be tested but the bony landmarks lined up appropriately with the arm in flexion forming a triangle. Muscle tone normal skin intact. Pulses good in the radial artery normal sensation in the right hand  X-ray show nondisplaced radial head fracture  Recommend strapping with ACE bandage, swelling, 3 times a day flexion extension exercises.  I explained to her the loss of extension with this type of fracture  Followup in 2 weeks to x-ray the elbow and start pronation supination exercises. Prescribe Norco for pain.

## 2012-07-13 NOTE — Patient Instructions (Signed)
Wear sling Exercise right elbow

## 2012-07-18 NOTE — Assessment & Plan Note (Signed)
Pain and reduced ROM following acute trauma, suspicious for fracture, this is unfortunately confirmed by xray. Ortho eval asap

## 2012-07-18 NOTE — Progress Notes (Signed)
  Subjective:    Patient ID: Gina Nelson, female    DOB: 12-04-1950, 62 y.o.   MRN: [id number]  HPI Pt fell on right arm 3 days ago, since then, unable to move right elbow fully in all directions. States pain is less than initially. Has not been taking any medication consistently for the pain   Review of Systems See HPI Denies recent fever or chills. Denies sinus pressure, nasal congestion, ear pain or sore throat. Denies chest congestion, productive cough or wheezing. Denies chest pains, palpitations and leg swelling       Objective:   Physical Exam Patient alert and oriented and in no cardiopulmonary distress.Pt in mild to moderate pain  HEENT: No facial asymmetry, EOMI, no sinus tenderness,  oropharynx pink and moist.  Neck supple no adenopathy.  Chest: Clear to auscultation bilaterally.  CVS: S1, S2 no murmurs, no S3.  ABD: Soft non tender. Bowel sounds normal.  Ext: No edema  MS: Adequate ROM spine, shoulders, hips and knees.Decreased ROM right elbow with tenderness  Skin: Intact, no ulcerations or rash noted.        Assessment & Plan:

## 2012-07-27 ENCOUNTER — Ambulatory Visit (INDEPENDENT_AMBULATORY_CARE_PROVIDER_SITE_OTHER): Payer: BC Managed Care – PPO

## 2012-07-27 ENCOUNTER — Encounter: Payer: Self-pay | Admitting: Orthopedic Surgery

## 2012-07-27 ENCOUNTER — Ambulatory Visit (INDEPENDENT_AMBULATORY_CARE_PROVIDER_SITE_OTHER): Payer: BC Managed Care – PPO | Admitting: Orthopedic Surgery

## 2012-07-27 VITALS — BP 130/78 | Ht 65.0 in | Wt 168.0 lb

## 2012-07-27 DIAGNOSIS — S42309D Unspecified fracture of shaft of humerus, unspecified arm, subsequent encounter for fracture with routine healing: Secondary | ICD-10-CM

## 2012-07-27 DIAGNOSIS — S5291XD Unspecified fracture of right forearm, subsequent encounter for closed fracture with routine healing: Secondary | ICD-10-CM

## 2012-07-27 DIAGNOSIS — S5290XD Unspecified fracture of unspecified forearm, subsequent encounter for closed fracture with routine healing: Secondary | ICD-10-CM

## 2012-07-27 DIAGNOSIS — S42401D Unspecified fracture of lower end of right humerus, subsequent encounter for fracture with routine healing: Secondary | ICD-10-CM

## 2012-07-27 NOTE — Progress Notes (Signed)
Patient ID: Gina Nelson, female   DOB: [birth year], 62 y.o.   MRN: [id number] Chief Complaint  Patient presents with  . Follow-up    2 week recheck right elbow fracture with xray DOI 07/11/12    Status post right radial head fracture doing well no pain has full pronation supination without pain has flexion 125 extension -5 some pain and extension x-ray show stable fracture  Continue range of motion exercises and strengthening exercises followup after the graduation of her granddaughter

## 2012-07-27 NOTE — Patient Instructions (Addendum)
Continue with arm exercises bending and stretching

## 2012-08-02 ENCOUNTER — Ambulatory Visit (INDEPENDENT_AMBULATORY_CARE_PROVIDER_SITE_OTHER): Payer: BC Managed Care – PPO | Admitting: General Surgery

## 2012-08-27 ENCOUNTER — Ambulatory Visit: Payer: BC Managed Care – PPO | Admitting: Family Medicine

## 2012-08-31 ENCOUNTER — Encounter: Payer: Self-pay | Admitting: Orthopedic Surgery

## 2012-08-31 ENCOUNTER — Ambulatory Visit (INDEPENDENT_AMBULATORY_CARE_PROVIDER_SITE_OTHER): Payer: BC Managed Care – PPO | Admitting: Orthopedic Surgery

## 2012-08-31 VITALS — BP 112/70 | Ht 65.0 in | Wt 168.0 lb

## 2012-08-31 DIAGNOSIS — S5290XD Unspecified fracture of unspecified forearm, subsequent encounter for closed fracture with routine healing: Secondary | ICD-10-CM

## 2012-08-31 DIAGNOSIS — S5291XD Unspecified fracture of right forearm, subsequent encounter for closed fracture with routine healing: Secondary | ICD-10-CM

## 2012-08-31 NOTE — Patient Instructions (Addendum)
activities as tolerated 

## 2012-08-31 NOTE — Progress Notes (Signed)
Patient ID: Gina Nelson, female   DOB: 1951-01-05, 62 y.o.   MRN: [id number]  Chief Complaint  Patient presents with  . Follow-up     Fracture care follow up right elbow. DOI 07/11/12    No complaints at this time  Exam right elbow full range of motion no tenderness ligaments stable  Activities as tolerated return as needed

## 2012-09-09 ENCOUNTER — Ambulatory Visit (INDEPENDENT_AMBULATORY_CARE_PROVIDER_SITE_OTHER): Payer: BC Managed Care – PPO | Admitting: General Surgery

## 2012-09-20 ENCOUNTER — Telehealth: Payer: Self-pay | Admitting: *Deleted

## 2012-09-20 NOTE — Telephone Encounter (Signed)
Patient calling in to report that since she was last seen, she has been treated several times by her primary, Dr Syliva Overman, for chronic yeast infections. She states that she and her husband most likely are re-infecting each other, hae has recently complained of penile tenderness, and he must be treated to. She also complains of severe vaginal dryness, tightness and severe pain with sexual penetration. Gina Nelson was switched to Tamoxifen in 02/2011 from Letrozole, due to poor tolerance of arthralgias. She is wondering if there is another treatment she needs to be on, she is tired of the problems this seems to be causing. Informed patient to call her primary for a refill on the Diflucan, have her spouse call his primary for evaluation and treatment, or at least try some OTC yeast treatment such as vagisil. Must abstain from sexual intercourse until treatment completed. Encouraged patient and spouse to use lubrication for intercourse to ensure the dryness is not also being exacerbated with tears in the vaginal walls. Informed patient that I would share her information with Dr Darnelle Catalan and he would review upon his return to office and we would call her back in the next few days.  OK for her to stop the Tamoxifen for these couple of days until we get further clarification from Dr Darnelle Catalan. Patient verbalized understanding.

## 2012-09-21 ENCOUNTER — Telehealth: Payer: Self-pay

## 2012-09-21 MED ORDER — FLUCONAZOLE 100 MG PO TABS: 100.0000 mg | ORAL_TABLET | Freq: Every day | ORAL | Status: DC

## 2012-09-21 NOTE — Telephone Encounter (Signed)
Has a yeast infection from the tamoxifen and her cancer Dr is out of town. Ok to prescribe a fluconazole? [facility] 

## 2012-09-21 NOTE — Telephone Encounter (Signed)
Pls let pt know I spoke with pharmacy, tamoxifen may cause vaginitis , probably due to  anti estrogen action , not yeast infection, she really needs to further address this symptom with her oncologist to see how best to handle. I suggest send in a self collected vag swab to see if yeast detected, because if she has no yeast infection then taking a tablet for this  is of no benefit.   Pls also let her know that over 4 months ago her blood sugar avg was higher than normal, nearly that of a diabetic, I am very  concerned this may be the problem , pls order and have her do rept fasting chem 7 and HBA1C this week if possible but no later than next week, may also need need an oV if her blood sugar avg is as high as it was last time, ex[plain she was 6.4, and diabetes is 6.5  Any questions or concerns pls redirect to me, thanks

## 2012-09-21 NOTE — Addendum Note (Signed)
Addended by: Laroy Apple E on: 09/21/2012 06:17 PM   Modules accepted: Orders

## 2012-09-21 NOTE — Telephone Encounter (Signed)
Called patient back. Her primary is out of town. Ok for [place] to call in this time for Diflucan.  Dr Darnelle Catalan states that she and spouse must abstain from sexual relations until both have been treated for yeast infections. She states her husband will call tomorrow. Patient verbalized understanding.

## 2012-09-22 ENCOUNTER — Ambulatory Visit (INDEPENDENT_AMBULATORY_CARE_PROVIDER_SITE_OTHER): Payer: BC Managed Care – PPO | Admitting: Family Medicine

## 2012-09-22 ENCOUNTER — Ambulatory Visit: Payer: BC Managed Care – PPO | Admitting: Family Medicine

## 2012-09-22 ENCOUNTER — Encounter: Payer: Self-pay | Admitting: Family Medicine

## 2012-09-22 ENCOUNTER — Other Ambulatory Visit (HOSPITAL_COMMUNITY)
Admission: RE | Admit: 2012-09-22 | Discharge: 2012-09-22 | Disposition: A | Discharge status: Home / Self Care | Payer: BC Managed Care – PPO | Source: Ambulatory Visit | Attending: Family Medicine | Admitting: Family Medicine

## 2012-09-22 VITALS — BP 102/70 | HR 83 | Resp 18 | Ht 65.0 in | Wt 169.1 lb

## 2012-09-22 DIAGNOSIS — Z23 Encounter for immunization: Secondary | ICD-10-CM

## 2012-09-22 DIAGNOSIS — Z113 Encounter for screening for infections with a predominantly sexual mode of transmission: Secondary | ICD-10-CM | POA: Insufficient documentation

## 2012-09-22 DIAGNOSIS — N760 Acute vaginitis: Secondary | ICD-10-CM | POA: Insufficient documentation

## 2012-09-22 DIAGNOSIS — R7309 Other abnormal glucose: Secondary | ICD-10-CM

## 2012-09-22 DIAGNOSIS — R7301 Impaired fasting glucose: Secondary | ICD-10-CM

## 2012-09-22 DIAGNOSIS — E559 Vitamin D deficiency, unspecified: Secondary | ICD-10-CM

## 2012-09-22 DIAGNOSIS — R7303 Prediabetes: Secondary | ICD-10-CM

## 2012-09-22 DIAGNOSIS — C50911 Malignant neoplasm of unspecified site of right female breast: Secondary | ICD-10-CM

## 2012-09-22 DIAGNOSIS — F411 Generalized anxiety disorder: Secondary | ICD-10-CM

## 2012-09-22 DIAGNOSIS — C50919 Malignant neoplasm of unspecified site of unspecified female breast: Secondary | ICD-10-CM

## 2012-09-22 LAB — HEMOGLOBIN A1C: Mean Plasma Glucose: 126 mg/dL — ABNORMAL HIGH (ref <117)

## 2012-09-22 MED ORDER — FLUCONAZOLE 150 MG PO TABS: ORAL_TABLET | ORAL | Status: DC

## 2012-09-22 MED ORDER — ALPRAZOLAM 0.25 MG PO TABS: ORAL_TABLET | ORAL | Status: AC

## 2012-09-22 NOTE — Progress Notes (Signed)
  Subjective:    Patient ID: Gina Nelson, female    DOB: 21-Aug-1950, 62 y.o.   MRN: [id number]  HPI Pt in today, tearful and anxious, stating that ever since starting tamoxifen, she has been experiencing vaginal dryness and itching, which has interfered with her sex life , which she thoroughly enjoys and needs, and she is convinced that she has recurrent ongoing yeast infections which she is now passing to her husband, since he too has c/o itching. She is distraught and anxious and devastated , thinks Docs are not listening, and states that she will need another onc drug or , if fluconazole will control her symptoms ,need this indefinitely for the next 5 years while on treatment. She has been seen once in the past 4 months with similar complaint, of note no yeast infection was diagnosed from swabs at that time and i suspect same will be true again   Review of Systems See HPI Denies recent fever or chills. Denies sinus pressure, nasal congestion, ear pain or sore throat. Denies chest congestion, productive cough or wheezing. Denies chest pains, palpitations and leg swelling Denies abdominal pain, nausea, vomiting,diarrhea or constipation.   Denies dysuria, frequency, hesitancy or incontinence. Denies joint pain, swelling and limitation in mobility. Denies headaches, seizures, numbness, or tingling. Denies skin break down or rash.        Objective:   Physical Exam  Patient alert and oriented and in no cardiopulmonary distress.Anxious, irritable and tearful  HEENT: No facial asymmetry, EOMI, no sinus tenderness,  oropharynx pink and moist.  Neck supple no adenopathy.  Chest: Clear to auscultation bilaterally.  CVS: S1, S2 no murmurs, no S3.  ABD: Soft non tender. Bowel sounds normal. Pelvic: ext genitalia normal, no rash , ulcer  Or adenopathy. Vaginal wall pink and moist, no ulcers, no adnexal tenderness Ext: No edema  MS: Adequate ROM spine, shoulders, hips and  knees.  Skin: Intact, no ulcerations or rash noted.  Psych: Good eye contact, normal affect. Memory intact  anxious and  depressed appearing.  CNS: CN 2-12 intact, power, tone and sensation normal throughout.       Assessment & Plan:

## 2012-09-22 NOTE — Telephone Encounter (Signed)
Patient scheduled appt for 9/10

## 2012-09-22 NOTE — Patient Instructions (Addendum)
F/u in 2 month, call if you need me before  Use replens as a vaginal lubricant  One fluconazole 150mg  tablet once weekly for symptom control until the situation is fully sorted out. I will be in touch with your oncologist, an we may also recommend gyne evaluation  Xanax one daily as needed, for anxiety , only use if needed. Believe that we will work together to improve your health and symptoms, we listen to you and we hear you!   HBA1c and flu vaccine today and vit D level

## 2012-09-23 ENCOUNTER — Ambulatory Visit: Payer: BC Managed Care – PPO | Admitting: Family Medicine

## 2012-09-23 LAB — VITAMIN D 25 HYDROXY (VIT D DEFICIENCY, FRACTURES): Vit D, 25-Hydroxy: 38 ng/mL (ref 30–89)

## 2012-09-26 DIAGNOSIS — R7303 Prediabetes: Secondary | ICD-10-CM | POA: Insufficient documentation

## 2012-09-26 NOTE — Assessment & Plan Note (Signed)
Re checked HBa1C, has worsened. Pt needs to focus on reducing sugar intake and daily exercise to prevent development of diabetes Rept lab in 4 month

## 2012-09-26 NOTE — Assessment & Plan Note (Signed)
Recurrent/persistent vaginal dryness and itch in the past 4 weeks in particular , bu first noted on tamoxifen Reports symptomatic improvement with fluconazole, though no evidence of yeast infection in the past. I believe that the anti estrogen effect of tamoxifen is causing the perception of dryness, exam has been and is normal today, however pt reports this as a symptom noted both by herself and her spouse. Need to co ordinate care  Specimen sent for testing. Requests treatment for spouse , I explained that I am unable to do this with no definite dx  and the fact that he is not under my care

## 2012-09-26 NOTE — Assessment & Plan Note (Signed)
Increased and uncontrolled start xanax as needed , short term only

## 2012-09-26 NOTE — Assessment & Plan Note (Signed)
Currently on tamoxifen and experiencing urogenital issues will need to d/w onc repossibly using alternative med. 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

## 2012-09-26 NOTE — Assessment & Plan Note (Signed)
Continue weekly vit D 

## 2012-09-30 ENCOUNTER — Ambulatory Visit: Payer: BC Managed Care – PPO | Admitting: Family Medicine

## 2012-10-04 ENCOUNTER — Other Ambulatory Visit: Payer: Self-pay | Admitting: Oncology

## 2012-10-12 ENCOUNTER — Encounter (INDEPENDENT_AMBULATORY_CARE_PROVIDER_SITE_OTHER): Payer: Self-pay | Admitting: General Surgery

## 2012-10-12 ENCOUNTER — Ambulatory Visit (INDEPENDENT_AMBULATORY_CARE_PROVIDER_SITE_OTHER): Payer: BC Managed Care – PPO | Admitting: General Surgery

## 2012-10-12 VITALS — BP 118/80 | HR 60 | Resp 14 | Ht 65.0 in | Wt 169.0 lb

## 2012-10-12 DIAGNOSIS — Z853 Personal history of malignant neoplasm of breast: Secondary | ICD-10-CM

## 2012-10-12 NOTE — Patient Instructions (Signed)
Call if you notice any nodules on the chest wall

## 2012-10-12 NOTE — Progress Notes (Signed)
Operation:  Right MRM, Left SM for Right breast CA  Date:  06/27/2010  Stage: T1cN2  Hormone receptor status:  Positive  HPI:  Gina Nelson is here for  followup of her right breast cancer. .  She denies any chest wall nodules or adenopathy.  No swelling of right arm.  She is tolerating her Tamoxifen.   PE:  Gen-she looks well  Chest:  Right side demonstrates XRT changes to the skin; no nodules. Left side demonstrates no nodules.  Lymph nodes:  No palpable cervical, supraclavicular, or axillary adenopathy.   Assessment:  Right breast cancer- no clinical evidence of recurrence.  On Tamoxifen and tolerating it well.  Plan:  Return visit in 6  Months.

## 2012-11-24 ENCOUNTER — Ambulatory Visit (INDEPENDENT_AMBULATORY_CARE_PROVIDER_SITE_OTHER): Payer: BC Managed Care – PPO | Admitting: Family Medicine

## 2012-11-24 ENCOUNTER — Encounter (INDEPENDENT_AMBULATORY_CARE_PROVIDER_SITE_OTHER): Payer: Self-pay

## 2012-11-24 ENCOUNTER — Encounter: Payer: Self-pay | Admitting: Family Medicine

## 2012-11-24 VITALS — BP 112/70 | HR 82 | Resp 16 | Ht 65.0 in | Wt 167.0 lb

## 2012-11-24 DIAGNOSIS — R7303 Prediabetes: Secondary | ICD-10-CM

## 2012-11-24 DIAGNOSIS — R5381 Other malaise: Secondary | ICD-10-CM

## 2012-11-24 DIAGNOSIS — R7309 Other abnormal glucose: Secondary | ICD-10-CM

## 2012-11-24 DIAGNOSIS — J309 Allergic rhinitis, unspecified: Secondary | ICD-10-CM

## 2012-11-24 DIAGNOSIS — J302 Other seasonal allergic rhinitis: Secondary | ICD-10-CM

## 2012-11-24 DIAGNOSIS — Z13220 Encounter for screening for lipoid disorders: Secondary | ICD-10-CM

## 2012-11-24 DIAGNOSIS — K5909 Other constipation: Secondary | ICD-10-CM

## 2012-11-24 DIAGNOSIS — N760 Acute vaginitis: Secondary | ICD-10-CM

## 2012-11-24 DIAGNOSIS — F411 Generalized anxiety disorder: Secondary | ICD-10-CM

## 2012-11-24 NOTE — Patient Instructions (Addendum)
F/u with pelvic and breast mid February, call if you need me before  I am thankful that you are [facility] better.  Please start exercising 30 minutes 6 days per wek  Please continue to eat mainly fresh/frozen fruit and vegetable, and avoid sodas   Fasting lipid, chem 7, HBA1C, TSH, CBc on  Feb 10 or after, BEFORE visit pls

## 2012-11-24 NOTE — Progress Notes (Signed)
  Subjective:    Patient ID: Talbert Forest, female    DOB: September 24, 1950, 62 y.o.   MRN: [id number]  HPI The PT is here for follow up and re-evaluation of chronic medical conditions, medication management and review of any available recent lab and radiology data.  Preventive health is updated, specifically  Cancer screening and Immunization.    The PT denies any adverse reactions to current medications since the last visit. Took only one xanax since last visit, and states that replens ih helping a lot with vaginal dryness There are no new concerns.  There are no specific complaints       Review of Systems See HPI Denies recent fever or chills. Denies sinus pressure, nasal congestion, ear pain or sore throat. Denies chest congestion, productive cough or wheezing. Denies chest pains, palpitations and leg swelling Denies abdominal pain, nausea, vomiting,diarrhea or constipation.   Denies dysuria, frequency, hesitancy or incontinence. Denies joint pain, swelling and limitation in mobility. Denies headaches, seizures, numbness, or tingling. Denies depression, anxiety or insomnia. Denies skin break down or rash.        Objective:   Physical Exam Patient alert and oriented and in no cardiopulmonary distress.  HEENT: No facial asymmetry, EOMI, no sinus tenderness,  oropharynx pink and moist.  Neck supple no adenopathy.  Chest: Clear to auscultation bilaterally.  CVS: S1, S2 no murmurs, no S3.  ABD: Soft non tender. Bowel sounds normal.  Ext: No edema  MS: Adequate ROM spine, shoulders, hips and knees.  Skin: Intact, no ulcerations or rash noted.  Psych: Good eye contact, normal affect. Memory intact not anxious or depressed appearing.  CNS: CN 2-12 intact, power, tone and sensation normal throughout.        Assessment & Plan:

## 2012-11-28 NOTE — Assessment & Plan Note (Signed)
Resolved, no dependence on , or need for chronic medication

## 2012-11-28 NOTE — Assessment & Plan Note (Signed)
High fiber diet encouraged, use of laxative every 3 days as needed, and commitment to high water intake and regular exercise

## 2012-11-28 NOTE — Assessment & Plan Note (Signed)
Symptom resolution as far as dyspareunia is concerned, with use of replens. No regular use of xanax for anxiety, medication effectively discontinued

## 2012-11-28 NOTE — Assessment & Plan Note (Signed)
Patient educated about the importance of limiting  Carbohydrate intake , the need to commit to daily physical activity for a minimum of 30 minutes , and to commit weight loss. The fact that changes in all these areas will reduce or eliminate all together the development of diabetes is stressed.   Y updated lab in Feb, 2014

## 2012-11-28 NOTE — Assessment & Plan Note (Signed)
Controlled, no change in medication  

## 2012-11-29 ENCOUNTER — Telehealth: Payer: Self-pay | Admitting: Oncology

## 2012-11-29 ENCOUNTER — Other Ambulatory Visit: Payer: BC Managed Care – PPO

## 2012-11-29 ENCOUNTER — Ambulatory Visit (HOSPITAL_BASED_OUTPATIENT_CLINIC_OR_DEPARTMENT_OTHER): Payer: BC Managed Care – PPO | Admitting: Oncology

## 2012-11-29 VITALS — BP 117/80 | HR 60 | Temp 98.1°F | Resp 18 | Ht 65.0 in | Wt 170.4 lb

## 2012-11-29 DIAGNOSIS — C50519 Malignant neoplasm of lower-outer quadrant of unspecified female breast: Secondary | ICD-10-CM

## 2012-11-29 DIAGNOSIS — Z170 Estrogen receptor positive status [ER+]: Secondary | ICD-10-CM

## 2012-11-29 DIAGNOSIS — C773 Secondary and unspecified malignant neoplasm of axilla and upper limb lymph nodes: Secondary | ICD-10-CM

## 2012-11-29 DIAGNOSIS — C50919 Malignant neoplasm of unspecified site of unspecified female breast: Secondary | ICD-10-CM

## 2012-11-29 NOTE — Progress Notes (Signed)
ID: Gina Gina Nelson   DOB: [birth year]  MR#: [id number]  WUJ#:[id number]  PCP: Gina Overman, MD GYN: SU: Gina Gina Nelson OTHER MD:   HISTORY OF PRESENT ILLNESS: The patient herself noted a mass in her right breast.  She brought it to Dr. Anthony Nelson attention and worked her up for diagnostic mammography and ultrasonography December 29.  Dr. Judyann Gina Nelson was able to palpate a discrete mass in the right breast associated with skin thickening.  By mammography, there was an obscured mass associated with distortion in that area by ultrasound.  The mass was irregular and hypoechoic measuring 1.9 cm.  In the right axilla, there was an enlarged lymph node measuring 2.1 cm.  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  This appeared to be low grade and strongly ER positive at 100%, PR was 34%, the MIB-1 was 41% and there was no evidence of HER2/neu amplification by CISH with a ratio of 1.58.  With this information, the patient was referred to Dr. Abbey Gina Nelson and bilateral breast MRIs were obtained on January 11.  The MRI found a 4.9 cm lobulated, irregular mass with spiculated margins in the right breast at the 7 o'clock location with some skin and trabecular enhancement. There was linear enhancement extending to the nipple, but no apparent extension to the pectoralis muscle.  There were two lymph nodes noted in the right axilla, one measuring 8 mm, the other 6 mm.  There was no internal mammary adenopathy noted and the left breast was unremarkable.  Her subsequent history is as detailed below  INTERVAL HISTORY: Gina Gina Nelson returns today for followup of her breast cancer. The interval history is significant for her having retired in February 2014. However there are calling her to do part-time work in the same job. 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  REVIEW OF SYSTEMS: She is trying to walk most days, at least 20-30 minutes. She is also trying to avoid carbohydrates. This has resulted in a gradual but steady weight loss. She is tolerating the tamoxifen with no side effects that she is aware of other than vaginal dryness. Replens is helping with that. She tells me her sugar is borderline. Otherwise a detailed review of systems today was noncontributory  PAST MEDICAL HISTORY: Past Medical History  Diagnosis Date  . Arthritis     knee  . Blood transfusion   . Cancer     right breast T1cN2  . Allergy   . PONV (postoperative nausea and vomiting)     PAST SURGICAL HISTORY: Past Surgical History  Procedure Laterality Date  . Total knee arthroplasty  2011  . Abdominal hysterectomy  1984  . Port-a-cath removal  07/16/10  . Colonoscopy  05/07/2011    Procedure: COLONOSCOPY;  Surgeon: Gina Hippo, MD;  Location: [facility];  Service: Endoscopy;  Laterality: N/A;  830  . Breast surgery  right MRM, left simple mastectomy  . Breast surgery  left mastectomy    FAMILY HISTORY Family History  Problem Relation Age of Onset  . Cancer Father     prostate  The patient's father had prostate cancer metastatic to bone. He died at the age of 65. There is no other history of cancer in the family to the patient's knowledge.  GYNECOLOGIC HISTORY: Menarche age 50.  She stopped having periods of course with her hysterectomy in 1984. She is Gx, P1. She was 18 at the time of that delivery. She used hormone replacement for about two and a half years.  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 Her husband, Gina Gina Nelson, is a retired IT sales professional and currently a Engineer, site. Daughter, Gina Gina Nelson, keeps a home for handicapped girls. Her husband, Gina Gina Nelson, works in the Medical laboratory scientific officer business. The patient has one granddaughter who is in  her late teens.    ADVANCED DIRECTIVES:  HEALTH MAINTENANCE: History  Substance Use Topics  . Smoking status: Never Smoker   . Smokeless tobacco: Never Used  . Alcohol Use: No   She had a colonoscopy April 2013 which showed only a small tubular adenoma. She had a bone density September 2012 which was normal.   Allergies  Allergen Reactions  . Codeine Itching    Arms and face    Current Outpatient Prescriptions  Medication Sig Dispense Refill  . aspirin 81 MG tablet Take 81 mg by mouth daily.      . Bisacodyl (LAXATIVE PO) Take by mouth every other day.        . ergocalciferol (VITAMIN D2) 50000 UNITS capsule Take 1 capsule (50,000 Units total) by mouth once a week. One capsule once weekly  12 capsule  1  . fluticasone (FLONASE) 50 MCG/ACT nasal spray Place 1 spray into the nose 2 (two) times daily as needed for rhinitis.  16 g  2  . Multiple Vitamins-Minerals (CENTRUM SILVER ULTRA WOMENS PO) Take by mouth.      . tamoxifen (NOLVADEX) 20 MG tablet TAKE ONE TABLET BY MOUTH EVERY DAY  90 tablet  1   No current facility-administered medications for this visit.    OBJECTIVE: Middle-aged [place] American woman in no acute distress Filed Vitals:   11/29/12 0959  BP: 117/80  Pulse: 60  Temp: 98.1 F (36.7 Gina Nelson)  Resp: 18     Body mass index is 28.36 kg/(m^2).    ECOG FS: 0  Sclerae unicteric, arcus senilis noted Oropharynx clear, dentition is fair No cervical or supraclavicular adenopathy Lungs no rales or rhonchi Heart regular rate and rhythm Abd soft, nontender, positive bowel sounds MSK no focal spinal tenderness, no peripheral edema Neuro: nonfocal, well oriented, pleasant affect Breasts: Status post bilateral mastectomies. No evidence of local recurrence. Axillae benign bilaterally  LAB RESULTS: Lab Results  Component Value Date   WBC 12.9* 06/08/2012   NEUTROABS 10.2* 06/08/2012   HGB 12.3 06/08/2012   HCT 38.5 06/08/2012   MCV 85.0 06/08/2012   PLT 222 06/08/2012       Chemistry      Component Value Date/Time   NA 143 02/23/2012 0848   NA 143 11/27/2011 1454   K 4.1 02/23/2012 0848   K 3.8 11/27/2011 1454   CL 108 02/23/2012 0848   CL 109* 11/27/2011 1454   CO2 29 02/23/2012 0848   CO2 28 11/27/2011 1454   BUN 9 02/23/2012 0848   BUN 9.0 11/27/2011 1454   CREATININE 0.78 02/23/2012 0848   CREATININE 0.9 11/27/2011 1454   CREATININE 0.80 05/20/2011 0900      Component Value Date/Time   CALCIUM 9.2 02/23/2012 0848   CALCIUM 9.2 11/27/2011 1454   ALKPHOS 117 11/27/2011 1454   ALKPHOS 92 05/20/2011 0900   AST 17 11/27/2011 1454   AST 14 05/20/2011 0900   ALT 10 11/27/2011 1454  ALT <8 05/20/2011 0900   BILITOT 0.30 11/27/2011 1454   BILITOT 0.3 05/20/2011 0900       Lab Results  Component Value Date   LABCA2 25 11/27/2011    No components found with this basename: [id number]    No results found for this basename: INR,  in the last 168 hours  Urinalysis    Component Value Date/Time   COLORURINE YELLOW 05/24/2010 2049   APPEARANCEUR CLEAR 05/24/2010 2049   LABSPEC 1.008 05/24/2010 2049   PHURINE 6.0 05/24/2010 2049   GLUCOSEU NEGATIVE 05/24/2010 2049   HGBUR NEGATIVE 05/24/2010 2049   HGBUR negative 02/21/2009 1509   BILIRUBINUR NEGATIVE 05/24/2010 2049   KETONESUR 15* 05/24/2010 2049   PROTEINUR NEGATIVE 05/24/2010 2049   UROBILINOGEN 0.2 05/24/2010 2049   NITRITE NEGATIVE 05/24/2010 2049   LEUKOCYTESUR NEGATIVE MICROSCOPIC NOT DONE ON URINES WITH NEGATIVE PROTEIN, BLOOD, LEUKOCYTES, NITRITE, OR GLUCOSE <1000 mg/dL. 05/24/2010 2049    STUDIES: No results found.   ASSESSMENT: 62 y.o.  [place] woman status post right breast and axillary lymph node biopsy January 2012, both positive for an invasive ductal carcinoma, grade 3, measuring 4.9 cm by MRI, and so stage IIB/IIIA clinically. The tumor was ER positive, PR positive, HER2 negative, with an elevated MIB-1.   (1) She received neoadjuvant docetaxel/ cyclophosphamide x4 followed by  doxorubicin/ cyclophosphamide x1, at which point neoadjuvant treatment was discontinued because of poor tolerance  (2) she underwent right modified radical mastectomy with left prophylactic mastectomy June 2012. The left side was benign. On the right, she had 2 areas of residual tumor, one measuring 1.1 cm, the other less than a centimeter, both grade 3, involving 4 out of 13 lymph nodes sampled. Repeat HER2 was again negative, and repeat estrogen and progesterone receptors were again positive at 94% and 83%, respectively.   (3) adjuvant radiation treatments completed September of 2012, at which time she started letrozole, discontinued because of arthralgias/myalgias  (4) tamoxifen started February 2013, with good tolerance   PLAN: Gina Gina Nelson is doing fine from a breast cancer point of view, now more than 2 years in remission (counting of course from her definitive surgery). She is tolerating the tamoxifen well and we are going to continue tamoxifen for 2 years before switching to an aromatase inhibitor.  While on tamoxifen it is fine for her to use Vagifem suppositories or Estring for vaginal health. This was discussed extensively today. She will discuss it with her primary care physician, who follows her for her closely.  Gina Gina Nelson will see Dr. Delman Kitten. again in March she will see [place] again next September. We will do lab work that same day. She knows to call for any problems that may develop before that visit.   Gina Gina Nelson    11/29/2012

## 2012-11-29 NOTE — Telephone Encounter (Signed)
, °

## 2013-02-22 ENCOUNTER — Other Ambulatory Visit: Payer: Self-pay | Admitting: Family Medicine

## 2013-02-22 DIAGNOSIS — R7303 Prediabetes: Secondary | ICD-10-CM

## 2013-02-22 DIAGNOSIS — D649 Anemia, unspecified: Secondary | ICD-10-CM

## 2013-02-22 LAB — BASIC METABOLIC PANEL
BUN: 10 mg/dL (ref 6–23)
CO2: 29 mEq/L (ref 19–32)
Calcium: 8.7 mg/dL (ref 8.4–10.5)
Chloride: 108 mEq/L (ref 96–112)
Creat: 0.74 mg/dL (ref 0.50–1.10)
Glucose, Bld: 77 mg/dL (ref 70–99)
Potassium: 3.7 mEq/L (ref 3.5–5.3)
Sodium: 143 mEq/L (ref 135–145)

## 2013-02-22 LAB — HEMOGLOBIN A1C
Hgb A1c MFr Bld: 5.7 % — ABNORMAL HIGH (ref <5.7)
Mean Plasma Glucose: 117 mg/dL — ABNORMAL HIGH (ref <117)

## 2013-02-22 LAB — CBC
HCT: 36.5 % (ref 36.0–46.0)
Hemoglobin: 11.6 g/dL — ABNORMAL LOW (ref 12.0–15.0)
MCH: 26.5 pg (ref 26.0–34.0)
MCHC: 31.8 g/dL (ref 30.0–36.0)
MCV: 83.3 fL (ref 78.0–100.0)
Platelets: 209 10*3/uL (ref 150–400)
RBC: 4.38 MIL/uL (ref 3.87–5.11)
RDW: 14.7 % (ref 11.5–15.5)
WBC: 6.1 10*3/uL (ref 4.0–10.5)

## 2013-02-22 LAB — LIPID PANEL
Cholesterol: 148 mg/dL (ref 0–200)
HDL: 47 mg/dL (ref >39)
LDL Cholesterol: 87 mg/dL (ref 0–99)
Total CHOL/HDL Ratio: 3.1 Ratio
Triglycerides: 71 mg/dL (ref <150)
VLDL: 14 mg/dL (ref 0–40)

## 2013-02-22 LAB — TSH: TSH: 0.784 u[IU]/mL (ref 0.350–4.500)

## 2013-02-23 ENCOUNTER — Ambulatory Visit (INDEPENDENT_AMBULATORY_CARE_PROVIDER_SITE_OTHER): Payer: BC Managed Care – PPO | Admitting: Family Medicine

## 2013-02-23 ENCOUNTER — Encounter (INDEPENDENT_AMBULATORY_CARE_PROVIDER_SITE_OTHER): Payer: Self-pay

## 2013-02-23 ENCOUNTER — Other Ambulatory Visit (HOSPITAL_COMMUNITY)
Admission: RE | Admit: 2013-02-23 | Discharge: 2013-02-23 | Disposition: A | Discharge status: Home / Self Care | Payer: BC Managed Care – PPO | Source: Ambulatory Visit | Attending: Family Medicine | Admitting: Family Medicine

## 2013-02-23 VITALS — BP 110/70 | HR 70 | Resp 16 | Wt 175.0 lb

## 2013-02-23 DIAGNOSIS — Z0000 Encounter for general adult medical examination without abnormal findings: Secondary | ICD-10-CM

## 2013-02-23 DIAGNOSIS — Z1211 Encounter for screening for malignant neoplasm of colon: Secondary | ICD-10-CM

## 2013-02-23 DIAGNOSIS — N760 Acute vaginitis: Secondary | ICD-10-CM

## 2013-02-23 DIAGNOSIS — Z1272 Encounter for screening for malignant neoplasm of vagina: Secondary | ICD-10-CM

## 2013-02-23 DIAGNOSIS — Z01419 Encounter for gynecological examination (general) (routine) without abnormal findings: Secondary | ICD-10-CM | POA: Insufficient documentation

## 2013-02-23 LAB — FERRITIN: FERRITIN: 128 ng/mL (ref 10–291)

## 2013-02-23 LAB — POC HEMOCCULT BLD/STL (OFFICE/1-CARD/DIAGNOSTIC): Fecal Occult Blood, POC: NEGATIVE

## 2013-02-23 LAB — IRON: IRON: 83 ug/dL (ref 42–145)

## 2013-02-28 NOTE — Patient Instructions (Signed)
F/U in mid September, call if you need me before  It is important that you exercise regularly at least 30 minutes 5 times a week. If you develop chest pain, have severe difficulty breathing, or feel very tired, stop exercising immediately and seek medical attention    A healthy diet is rich in fruit, vegetables and whole grains. Poultry fish, nuts and beans are a healthy choice for protein rather then red meat. A low sodium diet and drinking 64 ounces of water daily is generally recommended. Oils and sweet should be limited. Carbohydrates especially for those who are diabetic or overweight, should be limited to 45 to 60 gram per meal. It is important to eat on a regular schedule, at least 3 times daily. Snacks should be primarily fruits, vegetables or nuts.  Adequate rest, generally 6 to 8 hours per night is important for good health.Good sleep hygiene involves setting a regular bedtime, and turning off all sound and light in your sleep environment.Limiting caffeine intake will also help with the ability to rest well  Lab work needs to be done 3 to 5 days before your follow up visit please.  All medications need to be brought to every visit

## 2013-02-28 NOTE — Assessment & Plan Note (Signed)
Annual exam as documented. Counseling done  re healthy lifestyle involving commitment to 150 minutes exercise per week, heart healthy diet, and attaining healthy weight.The importance of adequate sleep also discussed. Regular seat belt use and safe storage  of firearms if patient has them, is also discussed. Changes in health habits are decided on by the patient with goals and time frames  set for achieving them. Immunization and cancer screening needs are specifically addressed at this visit.  

## 2013-02-28 NOTE — Addendum Note (Signed)
Addended by: Eual Fines on: 02/28/2013 01:09 PM   Modules accepted: Orders

## 2013-03-01 ENCOUNTER — Encounter: Payer: Self-pay | Admitting: Family Medicine

## 2013-03-01 NOTE — Assessment & Plan Note (Signed)
Specimen sent for testing 

## 2013-03-01 NOTE — Progress Notes (Signed)
   Subjective:    Patient ID: Gina Nelson, female    DOB: 1950-01-27, 63 y.o.   MRN: [id number]  HPI The patient is for an annual exam. Health maintenance is reviewed and updated, specifically  recommended,  screening tests and immunizations. Recent lab and radiologic data is reviewed also.    Review of Systems See HPI Denies recent fever or chills. Denies sinus pressure, nasal congestion, ear pain or sore throat. Denies chest congestion, productive cough or wheezing. Denies chest pains, palpitations and leg swelling Denies abdominal pain, nausea, vomiting,diarrhea or constipation.   Denies dysuria, frequency, hesitancy or incontinence. Denies joint pain, swelling and limitation in mobility. Denies headaches, seizures, numbness, or tingling. Denies depression, anxiety or insomnia. Denies skin break down or rash.        Objective:   Physical Exam Pleasant well nourished female, alert and oriented x 3, in no cardio-pulmonary distress. Afebrile. HEENT No facial trauma or asymetry. Sinuses non tender.  EOMI, PERTL, fundoscopic exam  no hemorhage or exudate.  External ears normal, tympanic membranes clear. Oropharynx moist, no exudate, good dentition. Neck: supple, no adenopathy,JVD or thyromegaly.No bruits.  Chest: Clear to ascultation bilaterally.No crackles or wheezes. Non tender to palpation  Breast: Right mastectomy,no masses. No nipple discharge or inversion. No axillary or supraclavicular adenopathy  Cardiovascular system; Heart sounds normal,  S1 and  S2 ,no S3.  No murmur, or thrill. Apical beat not displaced Peripheral pulses normal.  Abdomen: Soft, non tender, no organomegaly or masses. No bruits. Bowel sounds normal. No guarding, tenderness or rebound.  Rectal:  No mass. Guaiac negative stool.  GU: External genitalia normal. No lesions. Vaginal canal normal.White  discharge. Uterus absent, no adnexal masses, no  adnexal  tenderness.  Musculoskeletal exam: Full ROM of spine, hips , shoulders and knees. No deformity ,swelling or crepitus noted. No muscle wasting or atrophy.   Neurologic: Cranial nerves 2 to 12 intact. Power, tone ,sensation and reflexes normal throughout. No disturbance in gait. No tremor.  Skin: Intact, no ulceration, erythema , scaling or rash noted. Pigmentation normal throughout  Psych; Normal mood and affect. Judgement and concentration normal        Assessment & Plan:  Routine general medical examination at a health care facility Annual exam as documented. Counseling done  re healthy lifestyle involving commitment to 150 minutes exercise per week, heart healthy diet, and attaining healthy weight.The importance of adequate sleep also discussed. Regular seat belt use and safe storage  of firearms if patient has them, is also discussed. Changes in health habits are decided on by the patient with goals and time frames  set for achieving them. Immunization and cancer screening needs are specifically addressed at this visit.

## 2013-03-16 ENCOUNTER — Ambulatory Visit (INDEPENDENT_AMBULATORY_CARE_PROVIDER_SITE_OTHER): Payer: BC Managed Care – PPO | Admitting: General Surgery

## 2013-03-16 ENCOUNTER — Telehealth: Payer: Self-pay

## 2013-03-16 NOTE — Telephone Encounter (Signed)
Throbbing headache behind her right ear. Ear echoing and she has nasal drainage (clear) especially in the am x 4 days. Has been using OTC sinus meds with no relief. Gina Nelson

## 2013-03-17 MED ORDER — AZITHROMYCIN 250 MG PO TABS: ORAL_TABLET | ORAL | Status: DC

## 2013-03-17 NOTE — Telephone Encounter (Signed)
Advise sudafed 1 daily for 5 days and z pack is sent in . Call if not better I will refer you to ENT, pt is aware

## 2013-04-07 ENCOUNTER — Other Ambulatory Visit: Payer: Self-pay | Admitting: Oncology

## 2013-04-07 DIAGNOSIS — C50919 Malignant neoplasm of unspecified site of unspecified female breast: Secondary | ICD-10-CM

## 2013-04-19 ENCOUNTER — Other Ambulatory Visit: Payer: Self-pay

## 2013-04-19 ENCOUNTER — Telehealth: Payer: Self-pay | Admitting: Family Medicine

## 2013-04-19 MED ORDER — FLUCONAZOLE 150 MG PO TABS: ORAL_TABLET | ORAL | Status: DC

## 2013-04-19 NOTE — Telephone Encounter (Signed)
Fluconazole 150mg  #2 sent.  Patient aware that if symptoms not better by Thursday 4-9 to contact the office.

## 2013-04-20 ENCOUNTER — Ambulatory Visit (INDEPENDENT_AMBULATORY_CARE_PROVIDER_SITE_OTHER): Payer: BC Managed Care – PPO | Admitting: General Surgery

## 2013-04-21 ENCOUNTER — Telehealth: Payer: Self-pay | Admitting: Family Medicine

## 2013-04-21 ENCOUNTER — Other Ambulatory Visit: Payer: Self-pay

## 2013-04-21 MED ORDER — FLUCONAZOLE 150 MG PO TABS: ORAL_TABLET | ORAL | Status: DC

## 2013-04-21 NOTE — Telephone Encounter (Signed)
Spoke with patient and she has taken 3 fluconazole in all.  Feels that symptoms are somewhat better but would like to know if she can get 2 more fluconazole to relieve symptoms.

## 2013-04-21 NOTE — Telephone Encounter (Signed)
Ok to send in 2 more fluconazole one time only, but I recommend more that she use the OTC monistat instead

## 2013-04-21 NOTE — Telephone Encounter (Signed)
Patient aware.

## 2013-05-11 ENCOUNTER — Encounter (INDEPENDENT_AMBULATORY_CARE_PROVIDER_SITE_OTHER): Payer: Self-pay | Admitting: General Surgery

## 2013-05-11 ENCOUNTER — Ambulatory Visit (INDEPENDENT_AMBULATORY_CARE_PROVIDER_SITE_OTHER): Payer: BC Managed Care – PPO | Admitting: General Surgery

## 2013-05-11 VITALS — BP 122/80 | HR 73 | Temp 98.2°F | Ht 65.0 in | Wt 168.8 lb

## 2013-05-11 DIAGNOSIS — C50919 Malignant neoplasm of unspecified site of unspecified female breast: Secondary | ICD-10-CM

## 2013-05-11 NOTE — Progress Notes (Signed)
Operation:  Right MRM, Left SM for Right breast CA  Date:  06/27/2010  Stage: T1cN2  Hormone receptor status:  Positive  HPI:  Gina Nelson is here for another long term  followup of her right breast cancer. .  She denies any chest wall nodules or adenopathy.  No swelling of right arm.  She is tolerating her Tamoxifen.   PE:  Gen-she looks well  Chest:  Right side demonstrates XRT changes to the skin; no nodules. Left side demonstrates no nodules.  Lymph nodes:  No palpable cervical, supraclavicular, or axillary adenopathy.   Assessment:  Right breast cancer- no clinical evidence of recurrence.  On Tamoxifen and tolerating it well.  Plan:  Return visit in 11  Months.

## 2013-05-11 NOTE — Patient Instructions (Signed)
Call if you find any new lumps in your breasts or chest wall. 

## 2013-05-30 ENCOUNTER — Telehealth: Payer: Self-pay

## 2013-05-30 MED ORDER — FLUCONAZOLE 150 MG PO TABS: ORAL_TABLET | ORAL | Status: DC

## 2013-05-30 MED ORDER — CLOTRIMAZOLE 2 % VA CREA: TOPICAL_CREAM | VAGINAL | Status: DC

## 2013-05-30 NOTE — Telephone Encounter (Signed)
Patient also states that she would like cream and pills if possible.

## 2013-05-30 NOTE — Telephone Encounter (Signed)
Patient states that she is having vaginal discomfort external and internal x 2 days.  Uncomfortable when sitting.  Would like to know if something can be sent to pharmacy.  No abnormal discharge noted.  States that she is using Replens which is coming back out in clumps now.

## 2013-05-30 NOTE — Telephone Encounter (Signed)
Patient aware.

## 2013-05-30 NOTE — Telephone Encounter (Signed)
See note sent to yoyu furhter questions pls ask

## 2013-05-30 NOTE — Telephone Encounter (Signed)
Pls let her know clotrimazole vaginal cream for 3 days only and one fluconazole tab is all she needs for current symptom, will have enoyugh left over for flare of symptoms in the next several months ahead I sent meds to pharmacy on file, let her know

## 2013-07-01 ENCOUNTER — Other Ambulatory Visit: Payer: Self-pay

## 2013-07-01 MED ORDER — CLOTRIMAZOLE 2 % VA CREA: TOPICAL_CREAM | VAGINAL | Status: DC

## 2013-07-01 MED ORDER — FLUCONAZOLE 150 MG PO TABS: ORAL_TABLET | ORAL | Status: DC

## 2013-08-03 ENCOUNTER — Ambulatory Visit (INDEPENDENT_AMBULATORY_CARE_PROVIDER_SITE_OTHER): Payer: BC Managed Care – PPO | Admitting: General Surgery

## 2013-08-03 ENCOUNTER — Encounter (INDEPENDENT_AMBULATORY_CARE_PROVIDER_SITE_OTHER): Payer: Self-pay | Admitting: General Surgery

## 2013-08-03 VITALS — BP 128/78 | HR 57 | Temp 97.6°F | Ht 65.0 in | Wt 168.0 lb

## 2013-08-03 DIAGNOSIS — C50919 Malignant neoplasm of unspecified site of unspecified female breast: Secondary | ICD-10-CM

## 2013-08-03 DIAGNOSIS — R0789 Other chest pain: Secondary | ICD-10-CM | POA: Insufficient documentation

## 2013-08-03 DIAGNOSIS — R071 Chest pain on breathing: Secondary | ICD-10-CM

## 2013-08-03 DIAGNOSIS — C50911 Malignant neoplasm of unspecified site of right female breast: Secondary | ICD-10-CM

## 2013-08-03 NOTE — Progress Notes (Signed)
Operation:  Right MRM, Left SM for Right breast CA  Date:  06/27/2010  Stage: T1cN2 right breast cancer  Hormone receptor status:  Positive  HPI:  Gina Nelson is here because she woke up 3 days ago with some pain to the touch at the medial aspect of her left mastectomy scar. This was the prophylactic side. She is unaware of any traumatic events. PE:  Gen-she looks well  Chest:  Right side demonstrates XRT changes to the skin; no nodules. Left side demonstrates no nodules but there is tenderness at the medial aspect of the scar. No erythema.  Lymph nodes:  No palpable cervical, supraclavicular, or axillary adenopathy.   Assessment:  Right breast cancer- no clinical evidence of recurrence.  Tender medial area of left chest wall scar without any evidence of mass.  Plan:  Apply heat to area. Nonsteroidals as needed. Return visit 8 months.

## 2013-08-03 NOTE — Patient Instructions (Signed)
Call if you find any new lumps in your breasts or chest wall. 

## 2013-09-01 ENCOUNTER — Encounter: Payer: Self-pay | Admitting: Family Medicine

## 2013-09-01 ENCOUNTER — Ambulatory Visit (INDEPENDENT_AMBULATORY_CARE_PROVIDER_SITE_OTHER): Payer: BC Managed Care – PPO | Admitting: Family Medicine

## 2013-09-01 VITALS — BP 130/70 | HR 94 | Resp 18 | Ht 65.0 in | Wt 167.1 lb

## 2013-09-01 DIAGNOSIS — N3001 Acute cystitis with hematuria: Secondary | ICD-10-CM | POA: Insufficient documentation

## 2013-09-01 DIAGNOSIS — N3000 Acute cystitis without hematuria: Secondary | ICD-10-CM

## 2013-09-01 LAB — POCT URINALYSIS DIPSTICK
Glucose, UA: NEGATIVE
Ketones, UA: NEGATIVE
Nitrite, UA: NEGATIVE
Protein, UA: 100
Spec Grav, UA: >=1.03
UROBILINOGEN UA: 0.2
pH, UA: 6

## 2013-09-01 MED ORDER — FLUCONAZOLE 150 MG PO TABS: ORAL_TABLET | ORAL | Status: DC

## 2013-09-01 MED ORDER — LEVOFLOXACIN 500 MG PO TABS: 500.0000 mg | ORAL_TABLET | Freq: Every day | ORAL | Status: DC

## 2013-09-01 NOTE — Assessment & Plan Note (Addendum)
2 day ho urinary frequency eith pressure and visible blood, antibiotic prescribed, pt educated about condition which she reports as a first. She will re submit urine for analysis to ensure no remaining blood in urine

## 2013-09-01 NOTE — Patient Instructions (Addendum)
F/u as before  You are being treated for an acute bladder infection (urinary tract infection)  Levaquin an antibiotic is prescribed to treat this .  Fluconazole pills have also been prescribed, in the event that you develop vaginal itch with the antibiotic   You are being given a specimen container to resubmit urine AFTER treatment to the lab , to ensure no blood is in the urine  Urinary Tract Infection A urinary tract infection (UTI) can occur any place along the urinary tract. The tract includes the kidneys, ureters, bladder, and urethra. A type of germ called bacteria often causes a UTI. UTIs are often helped with antibiotic medicine.  HOME CARE   If given, take antibiotics as told by your doctor. Finish them even if you start to feel better.  Drink enough fluids to keep your pee (urine) clear or pale yellow.  Avoid tea, drinks with caffeine, and bubbly (carbonated) drinks.  Pee often. Avoid holding your pee in for a long time.  Pee before and after having sex (intercourse).  Wipe from front to back after you poop (bowel movement) if you are a woman. Use each tissue only once. GET HELP RIGHT AWAY IF:   You have back pain.  You have lower belly (abdominal) pain.  You have chills.  You feel sick to your stomach (nauseous).  You throw up (vomit).  Your burning or discomfort with peeing does not go away.  You have a fever.  Your symptoms are not better in 3 days. MAKE SURE YOU:   Understand these instructions.  Will watch your condition.  Will get help right away if you are not doing well or get worse. Document Released: 06/18/2007 Document Revised: 09/24/2011 Document Reviewed: 07/31/2011 [facility] Patient Information 2015 [place], [place]. This information is not intended to replace advice given to you by your health care provider. Make sure you discuss any questions you have with your health care provider.

## 2013-09-03 NOTE — Progress Notes (Signed)
   Subjective:    Patient ID: Gina Nelson, female    DOB: 30-May-1950, 63 y.o.   MRN: [id number]  HPI 2 day h/o urinary frequency and pressure with visible blood earlier this morning. Denies fever, nausea, chills , or flank pain, this is her first UTI, she is aftaid   Review of Systems See HPI Denies recent fever or chills. Denies sinus pressure, nasal congestion, ear pain or sore throat. Denies chest congestion, productive cough or wheezing. Denies abdominal pain, nausea, vomiting,diarrhea or constipation.   omnia. Denies skin break down or rash.        Objective:   Physical Exam BP 130/70  Pulse 94  Resp 18  Ht 5\' 5"  (1.651 m)  Wt 167 lb 1.9 oz (75.805 kg)  BMI 27.81 kg/m2  SpO2 96% Patient alert and oriented and in no cardiopulmonary distress.Anxious  HEENT: No facial asymmetry, EOMI,   oropharynx pink and moist.  Neck supple   Chest: Clear to auscultation bilaterally.  CVS: S1, S2 no murmurs, no S3.Regular rate.  ABD: Soft non tender. No renal angle or suprapubic tenderness  Ext: No edema  MS: Adequate ROM spine, shoulders, hips and knees.         Assessment & Plan:  Acute cystitis with hematuria 2 day ho urinary frequency eith pressure and visible blood, antibiotic prescribed, pt educated about condition which she reports as a first. She will re submit urine for analysis to ensure no remaining blood in urine

## 2013-09-04 LAB — URINE CULTURE: Colony Count: >=100000

## 2013-09-08 LAB — URINALYSIS, ROUTINE W REFLEX MICROSCOPIC
Bilirubin Urine: NEGATIVE
Glucose, UA: NEGATIVE mg/dL
HGB URINE DIPSTICK: NEGATIVE
KETONES UR: NEGATIVE mg/dL
LEUKOCYTES UA: NEGATIVE
NITRITE: NEGATIVE
PH: 7 (ref 5.0–8.0)
Protein, ur: NEGATIVE mg/dL
Specific Gravity, Urine: 1.009 (ref 1.005–1.030)
UROBILINOGEN UA: 0.2 mg/dL (ref 0.0–1.0)

## 2013-09-13 ENCOUNTER — Other Ambulatory Visit: Payer: Self-pay | Admitting: Family Medicine

## 2013-09-13 LAB — CBC WITH DIFFERENTIAL/PLATELET
Basophils Absolute: 0 10*3/uL (ref 0.0–0.1)
Basophils Relative: 0 % (ref 0–1)
EOS ABS: 0.2 10*3/uL (ref 0.0–0.7)
EOS PCT: 3 % (ref 0–5)
HEMATOCRIT: 34.5 % — AB (ref 36.0–46.0)
HEMOGLOBIN: 11.6 g/dL — AB (ref 12.0–15.0)
LYMPHS ABS: 1.6 10*3/uL (ref 0.7–4.0)
Lymphocytes Relative: 28 % (ref 12–46)
MCH: 27.8 pg (ref 26.0–34.0)
MCHC: 33.6 g/dL (ref 30.0–36.0)
MCV: 82.5 fL (ref 78.0–100.0)
MONO ABS: 0.5 10*3/uL (ref 0.1–1.0)
MONOS PCT: 8 % (ref 3–12)
NEUTROS PCT: 61 % (ref 43–77)
Neutro Abs: 3.5 10*3/uL (ref 1.7–7.7)
Platelets: 196 10*3/uL (ref 150–400)
RBC: 4.18 MIL/uL (ref 3.87–5.11)
RDW: 15.5 % (ref 11.5–15.5)
WBC: 5.7 10*3/uL (ref 4.0–10.5)

## 2013-09-13 LAB — HEMOGLOBIN A1C
Hgb A1c MFr Bld: 6 % — ABNORMAL HIGH (ref <5.7)
Mean Plasma Glucose: 126 mg/dL — ABNORMAL HIGH (ref <117)

## 2013-09-20 ENCOUNTER — Encounter: Payer: Self-pay | Admitting: Family Medicine

## 2013-09-20 ENCOUNTER — Ambulatory Visit (INDEPENDENT_AMBULATORY_CARE_PROVIDER_SITE_OTHER): Payer: BC Managed Care – PPO | Admitting: Family Medicine

## 2013-09-20 ENCOUNTER — Encounter (INDEPENDENT_AMBULATORY_CARE_PROVIDER_SITE_OTHER): Payer: Self-pay

## 2013-09-20 VITALS — BP 118/72 | HR 82 | Resp 16 | Ht 65.0 in | Wt 167.1 lb

## 2013-09-20 DIAGNOSIS — Z13220 Encounter for screening for lipoid disorders: Secondary | ICD-10-CM

## 2013-09-20 DIAGNOSIS — R5381 Other malaise: Secondary | ICD-10-CM

## 2013-09-20 DIAGNOSIS — R5383 Other fatigue: Secondary | ICD-10-CM

## 2013-09-20 DIAGNOSIS — Z23 Encounter for immunization: Secondary | ICD-10-CM

## 2013-09-20 DIAGNOSIS — R7309 Other abnormal glucose: Secondary | ICD-10-CM

## 2013-09-20 DIAGNOSIS — R7303 Prediabetes: Secondary | ICD-10-CM

## 2013-09-20 DIAGNOSIS — Z723 Lack of physical exercise: Secondary | ICD-10-CM

## 2013-09-20 NOTE — Progress Notes (Signed)
   Subjective:    Patient ID: Gina Nelson, female    DOB: [birth year], 63 y.o.   MRN: [id number]  HPI The PT is here for follow up and re-evaluation of chronic medical conditions, medication management and review of any available recent lab and radiology data.  Preventive health is updated, specifically  Cancer screening and Immunization.   Recently saw the surgeon who operated on her breasts, and has an upcoming appointment with her oncologist. Has concerns about "extra tissue" in left posterior axillary area No regular exercise and drinks sodas, also increased portions of bread and rice considering the fact that she is prediabetic   The PT denies any adverse reactions to current medications since the last visit.  There are no new concerns.  She has recovered fully from her UTI and is no longer scared about the episode , which was the first time in her  life      Review of Systems See HPI Denies recent fever or chills. Denies sinus pressure, nasal congestion, ear pain or sore throat. Denies chest congestion, productive cough or wheezing. Denies chest pains, palpitations and leg swelling Denies abdominal pain, nausea, vomiting,diarrhea or constipation.   Denies dysuria, frequency, hesitancy or incontinence. Denies joint pain, swelling and limitation in mobility. Denies headaches, seizures, numbness, or tingling. Denies depression, anxiety or insomnia. Denies skin break down or rash.        Objective:   Physical Exam  BP 118/72  Pulse 82  Resp 16  Ht 5\' 5"  (1.651 m)  Wt 167 lb 1.9 oz (75.805 kg)  BMI 27.81 kg/m2  SpO2 99% Patient alert and oriented and in no cardiopulmonary distress.  HEENT: No facial asymmetry, EOMI,   oropharynx pink and moist.  Neck supple no JVD, no mass.  Chest: Clear to auscultation bilaterally.  CVS: S1, S2 no murmurs, no S3.Regular rate.  ABD: Soft non tender.   Ext: No edema  MS: Adequate ROM spine, shoulders, hips and  knees.  Skin: Intact, no ulcerations or rash noted.  Psych: Good eye contact, normal affect. Memory intact not anxious or depressed appearing.  CNS: CN 2-12 intact, power,  normal throughout.no focal deficits noted.       Assessment & Plan:  Prediabetes Deteriorated Patient educated about the importance of limiting  Carbohydrate intake , the need to commit to daily physical activity for a minimum of 30 minutes , and to commit weight loss. The fact that changes in all these areas will reduce or eliminate all together the development of diabetes is stressed.     Need for prophylactic vaccination and inoculation against influenza Vaccine administered at visit.   Lack of exercise Pt to commit to 30 mins of physical activity at least 5 days per week

## 2013-09-20 NOTE — Assessment & Plan Note (Signed)
Deteriorated Patient educated about the importance of limiting  Carbohydrate intake , the need to commit to daily physical activity for a minimum of 30 minutes , and to commit weight loss. The fact that changes in all these areas will reduce or eliminate all together the development of diabetes is stressed.    

## 2013-09-20 NOTE — Patient Instructions (Signed)
CPE Feb 12 or after, call if you need me before   flu vaccine today  Check your ins for coverage for PREVNAR which you should get next visit (pneumonia vaccine)  Physical activity at least 5 days per week  Please reduce intake of regular soda , and use half size portions of bread, rice and starchy  Foods, your blood sugar has increased  CBC, fasting lipid, cmp, HBA1C, TSH

## 2013-09-20 NOTE — Assessment & Plan Note (Signed)
Vaccine administered at visit.  

## 2013-09-20 NOTE — Assessment & Plan Note (Signed)
Pt to commit to 30 mins of physical activity at least 5 days per week

## 2013-10-03 ENCOUNTER — Ambulatory Visit (HOSPITAL_BASED_OUTPATIENT_CLINIC_OR_DEPARTMENT_OTHER): Payer: BC Managed Care – PPO | Admitting: Oncology

## 2013-10-03 ENCOUNTER — Other Ambulatory Visit (HOSPITAL_BASED_OUTPATIENT_CLINIC_OR_DEPARTMENT_OTHER): Payer: BC Managed Care – PPO

## 2013-10-03 VITALS — BP 114/64 | HR 61 | Temp 98.1°F | Resp 18 | Ht 65.0 in | Wt 167.8 lb

## 2013-10-03 DIAGNOSIS — C50919 Malignant neoplasm of unspecified site of unspecified female breast: Secondary | ICD-10-CM

## 2013-10-03 DIAGNOSIS — C773 Secondary and unspecified malignant neoplasm of axilla and upper limb lymph nodes: Secondary | ICD-10-CM

## 2013-10-03 DIAGNOSIS — Z170 Estrogen receptor positive status [ER+]: Secondary | ICD-10-CM

## 2013-10-03 DIAGNOSIS — C50519 Malignant neoplasm of lower-outer quadrant of unspecified female breast: Secondary | ICD-10-CM

## 2013-10-03 DIAGNOSIS — R7303 Prediabetes: Secondary | ICD-10-CM

## 2013-10-03 DIAGNOSIS — C50911 Malignant neoplasm of unspecified site of right female breast: Secondary | ICD-10-CM

## 2013-10-03 LAB — CBC WITH DIFFERENTIAL/PLATELET
BASO%: 0.2 % (ref 0.0–2.0)
BASOS ABS: 0 10*3/uL (ref 0.0–0.1)
EOS ABS: 0.2 10*3/uL (ref 0.0–0.5)
EOS%: 2.9 % (ref 0.0–7.0)
HEMATOCRIT: 38.2 % (ref 34.8–46.6)
HEMOGLOBIN: 11.9 g/dL (ref 11.6–15.9)
LYMPH%: 23.5 % (ref 14.0–49.7)
MCH: 26.4 pg (ref 25.1–34.0)
MCHC: 31.2 g/dL — ABNORMAL LOW (ref 31.5–36.0)
MCV: 84.7 fL (ref 79.5–101.0)
MONO#: 0.5 10*3/uL (ref 0.1–0.9)
MONO%: 8.4 % (ref 0.0–14.0)
NEUT%: 65 % (ref 38.4–76.8)
NEUTROS ABS: 4.1 10*3/uL (ref 1.5–6.5)
Platelets: 176 10*3/uL (ref 145–400)
RBC: 4.51 10*6/uL (ref 3.70–5.45)
RDW: 15.3 % — AB (ref 11.2–14.5)
WBC: 6.2 10*3/uL (ref 3.9–10.3)
lymph#: 1.5 10*3/uL (ref 0.9–3.3)

## 2013-10-03 LAB — COMPREHENSIVE METABOLIC PANEL (CC13)
ALBUMIN: 3.7 g/dL (ref 3.5–5.0)
ALT: 7 U/L (ref 0–55)
AST: 17 U/L (ref 5–34)
Alkaline Phosphatase: 112 U/L (ref 40–150)
Anion Gap: 10 mEq/L (ref 3–11)
BUN: 9.6 mg/dL (ref 7.0–26.0)
CO2: 25 mEq/L (ref 22–29)
Calcium: 9.3 mg/dL (ref 8.4–10.4)
Chloride: 109 mEq/L (ref 98–109)
Creatinine: 0.8 mg/dL (ref 0.6–1.1)
GLUCOSE: 97 mg/dL (ref 70–140)
POTASSIUM: 3.8 meq/L (ref 3.5–5.1)
Sodium: 144 mEq/L (ref 136–145)
Total Bilirubin: 0.47 mg/dL (ref 0.20–1.20)
Total Protein: 7 g/dL (ref 6.4–8.3)

## 2013-10-03 NOTE — Progress Notes (Signed)
ID: Gina Nelson   DOB: 01-Sep-1950  MR#: [id number]  JAS#:[id number]  PCP: Gina Nakayama, MD GYN: SU: Gina Nelson OTHER MD:   HISTORY OF PRESENT ILLNESS: The patient herself noted a mass in her right breast.  She brought it to Dr. Griffin Nelson attention and worked her up for diagnostic mammography and ultrasonography December 29.  Dr. Miquel Nelson was able to palpate a discrete mass in the right breast associated with skin thickening.  By mammography, there was an obscured mass associated with distortion in that area by ultrasound.  The mass was irregular and hypoechoic measuring 1.9 cm.  In the right axilla, there was an enlarged lymph node measuring 2.1 cm.  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  This appeared to be low grade and strongly ER positive at 100%, PR was 34%, the MIB-1 was 41% and there was no evidence of HER2/neu amplification by CISH with a ratio of 1.58.  With this information, the patient was referred to Dr. Zella Nelson and bilateral breast MRIs were obtained on January 11.  The MRI found a 4.9 cm lobulated, irregular mass with spiculated margins in the right breast at the 7 o'clock location with some skin and trabecular enhancement. There was linear enhancement extending to the nipple, but no apparent extension to the pectoralis muscle.  There were two lymph nodes noted in the right axilla, one measuring 8 mm, the other 6 mm.  There was no internal mammary adenopathy noted and the left breast was unremarkable.  Her subsequent history is as detailed below  INTERVAL HISTORY: Gina Nelson returns today for followup of her breast cancer. The interval history is generally unremarkable. She is tolerating tamoxifen with some hot flashes but no other significant side effects.  REVIEW OF  SYSTEMS: She had a recent urinary tract infection which was successfully treated. This was an unusual development for her. She does have some vaginal dryness issues. She understands tamoxifen if anything causes of vaginal discharge. Vaginal dryness is a simple menopausal problem. Aside from this she is arthritic pains here in there, but nothing more intense or persistent than before. A detailed review of systems today was noncontributory  PAST MEDICAL HISTORY: Past Medical History  Diagnosis Date  . Arthritis     knee  . Blood transfusion   . Cancer     right breast T1cN2  . Allergy   . PONV (postoperative nausea and vomiting)     PAST SURGICAL HISTORY: Past Surgical History  Procedure Laterality Date  . Total knee arthroplasty  2011  . Abdominal hysterectomy  1984  . Port-a-cath removal  07/16/10  . Colonoscopy  05/07/2011    Procedure: COLONOSCOPY;  Surgeon: Gina Houston, MD;  Location: [facility];  Service: Endoscopy;  Laterality: N/A;  830  . Breast surgery  right MRM, left simple mastectomy  . Breast surgery  left mastectomy    FAMILY HISTORY Family History  Problem Relation Age of Onset  . Cancer Father     prostate  The patient's father had prostate cancer metastatic to bone. He died at the age of 17. There is no other history of cancer in the family to the patient's knowledge.  GYNECOLOGIC HISTORY: Menarche age 31. She stopped having periods of course with her hysterectomy in 1984. She is Gx, P1. She  was 18 at the time of that delivery. She used hormone replacement for about two and a half years.  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 Her husband, Gina Nelson, is a retired Airline pilot and currently a Curator. Daughter, Gina Nelson, keeps a home for handicapped girls. Her husband, Gina Nelson, works in the Therapist, sports business. The patient has one granddaughter who is in her late  teens.    ADVANCED DIRECTIVES:  HEALTH MAINTENANCE: History  Substance Use Topics  . Smoking status: Never Smoker   . Smokeless tobacco: Never Used  . Alcohol Use: No   She had a colonoscopy April 2013 which showed only a small tubular adenoma. She had a bone density September 2012 which was normal.   Allergies  Allergen Reactions  . Codeine Itching    Arms and face    Current Outpatient Prescriptions  Medication Sig Dispense Refill  . aspirin 81 MG tablet Take 81 mg by mouth daily.      . Bisacodyl (LAXATIVE PO) Take by mouth every other day.        . clotrimazole (GYNE-LOTRIMIN 3) 2 % vaginal cream One applicator intravaginally at bedtime for 3 days  21 g  0  . Multiple Vitamins-Minerals (CENTRUM SILVER ULTRA WOMENS PO) Take by mouth.      . tamoxifen (NOLVADEX) 20 MG tablet TAKE ONE TABLET BY MOUTH ONCE DAILY  90 tablet  1   No current facility-administered medications for this visit.    OBJECTIVE: Middle-aged [place] American woman who appears well Filed Vitals:   10/03/13 1051  BP: 114/64  Pulse: 61  Temp: 98.1 F (36.7 C)  Resp: 18     Body mass index is 27.92 kg/(m^2).    ECOG FS: 0  Sclerae unicteric, EOMs intact Oropharynx clear and moist No cervical or supraclavicular adenopathy Lungs no rales or rhonchi Heart regular rate and rhythm Abd soft, nontender, positive bowel sounds MSK no focal spinal tenderness, no upper extremity edema Neuro: nonfocal, well oriented, pleasant affect Breasts: Status post bilateral mastectomies. No evidence of chest wall recurrence. Both axillae are benign.  LAB RESULTS: Lab Results  Component Value Date   WBC 6.2 10/03/2013   NEUTROABS 4.1 10/03/2013   HGB 11.9 10/03/2013   HCT 38.2 10/03/2013   MCV 84.7 10/03/2013   PLT 176 10/03/2013      Chemistry      Component Value Date/Time   NA 144 10/03/2013 1013   NA 143 02/22/2013 0750   K 3.8 10/03/2013 1013   K 3.7 02/22/2013 0750   CL 108 02/22/2013 0750   CL 109*  11/27/2011 1454   CO2 25 10/03/2013 1013   CO2 29 02/22/2013 0750   BUN 9.6 10/03/2013 1013   BUN 10 02/22/2013 0750   CREATININE 0.8 10/03/2013 1013   CREATININE 0.74 02/22/2013 0750   CREATININE 0.80 05/20/2011 0900      Component Value Date/Time   CALCIUM 9.3 10/03/2013 1013   CALCIUM 8.7 02/22/2013 0750   ALKPHOS 112 10/03/2013 1013   ALKPHOS 92 05/20/2011 0900   AST 17 10/03/2013 1013   AST 14 05/20/2011 0900   ALT 7 10/03/2013 1013   ALT <8 05/20/2011 0900   BILITOT 0.47 10/03/2013 1013   BILITOT 0.3 05/20/2011 0900       Lab Results  Component Value Date   LABCA2 25 11/27/2011    No components found with this basename: [id number]  No results found for this basename: INR,  in the last 168 hours  Urinalysis    Component Value Date/Time   COLORURINE YELLOW 09/07/2013 1024   APPEARANCEUR CLEAR 09/07/2013 1024   LABSPEC 1.009 09/07/2013 1024   PHURINE 7.0 09/07/2013 1024   GLUCOSEU NEG 09/07/2013 1024   HGBUR NEG 09/07/2013 1024   HGBUR negative 02/21/2009 1509   BILIRUBINUR NEG 09/07/2013 1024   BILIRUBINUR small 09/01/2013 1147   KETONESUR NEG 09/07/2013 1024   PROTEINUR NEG 09/07/2013 1024   PROTEINUR 100 09/01/2013 1147   UROBILINOGEN 0.2 09/07/2013 1024   UROBILINOGEN 0.2 09/01/2013 1147   NITRITE NEG 09/07/2013 1024   NITRITE negative 09/01/2013 1147   LEUKOCYTESUR NEG 09/07/2013 1024    STUDIES: No results found.  ASSESSMENT: 63 y.o.  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 The tumor was ER positive, PR positive, HER2 negative, with an elevated MIB-1.   (1) She received neoadjuvant docetaxel/ cyclophosphamide x4 followed by doxorubicin/ cyclophosphamide x1, at which point neoadjuvant treatment was discontinued because of poor tolerance  (2) she underwent right modified radical mastectomy with left prophylactic mastectomy  June 2012. The left side was benign. On the right, she had 2 areas of residual tumor, one measuring 1.1 cm, the other less than a centimeter, both grade 3, involving 4 out of 13 lymph nodes sampled. Repeat HER2 was again negative, and repeat estrogen and progesterone receptors were again positive at 94% and 83%, respectively.   (3) adjuvant radiation treatments completed September of 2012, at which time she started letrozole, discontinued because of arthralgias/myalgias  (4) tamoxifen started February 2013, to be continued for a total of 10 years   PLAN: Dawanna is now a little over 3 years out from her definitive surgery with no evidence of disease recurrence. She is tolerating the tamoxifen well. She understands that this strengthens her bones.  We discussed switching to an aromatase inhibitor which would allow her to complete her antiestrogen 7 2-1/2 years since that of an additional 7 years or so. However she is concerned about bone density issues, and of course aromatase inhibitors also worsen vaginal dryness, which may be the reason for her recent urinary tract infection.  After much discussion with she wants to do is simply stay on tamoxifen and we will start seeing her on a once a year basis at this point. She will be seeing Dr. Clarise Cruz 6 months from now and again yearly thereafter, so she will have a followup visit for her breast cancer every 6 months until she completes her followup here, which will be June of 2022.  Gina Nelson is a good understanding of the overall plan. She agrees with it. She knows the goal of treatment in her cases cure. She will call with any problems that may develop before next visit here.  Gina Nelson C    10/03/2013

## 2013-11-10 ENCOUNTER — Other Ambulatory Visit: Payer: Self-pay | Admitting: Oncology

## 2013-11-13 ENCOUNTER — Emergency Department (HOSPITAL_COMMUNITY): Payer: BC Managed Care – PPO

## 2013-11-13 ENCOUNTER — Other Ambulatory Visit: Payer: Self-pay

## 2013-11-13 ENCOUNTER — Encounter (HOSPITAL_COMMUNITY): Payer: Self-pay

## 2013-11-13 ENCOUNTER — Inpatient Hospital Stay (HOSPITAL_COMMUNITY)
Admission: EM | Admit: 2013-11-13 | Discharge: 2013-11-14 | DRG: 312 | Disposition: A | Discharge status: Home / Self Care | Payer: BC Managed Care – PPO | Attending: Family Medicine | Admitting: Family Medicine

## 2013-11-13 DIAGNOSIS — M1712 Unilateral primary osteoarthritis, left knee: Secondary | ICD-10-CM | POA: Diagnosis present

## 2013-11-13 DIAGNOSIS — Z7982 Long term (current) use of aspirin: Secondary | ICD-10-CM | POA: Diagnosis not present

## 2013-11-13 DIAGNOSIS — C50911 Malignant neoplasm of unspecified site of right female breast: Secondary | ICD-10-CM | POA: Diagnosis present

## 2013-11-13 DIAGNOSIS — S023XXA Fracture of orbital floor, initial encounter for closed fracture: Secondary | ICD-10-CM | POA: Diagnosis present

## 2013-11-13 DIAGNOSIS — Z79810 Long term (current) use of selective estrogen receptor modulators (SERMs): Secondary | ICD-10-CM

## 2013-11-13 DIAGNOSIS — S0232XA Fracture of orbital floor, left side, initial encounter for closed fracture: Secondary | ICD-10-CM | POA: Diagnosis present

## 2013-11-13 DIAGNOSIS — Y92015 Private garage of single-family (private) house as the place of occurrence of the external cause: Secondary | ICD-10-CM

## 2013-11-13 DIAGNOSIS — F411 Generalized anxiety disorder: Secondary | ICD-10-CM | POA: Diagnosis present

## 2013-11-13 DIAGNOSIS — W1830XA Fall on same level, unspecified, initial encounter: Secondary | ICD-10-CM | POA: Diagnosis present

## 2013-11-13 DIAGNOSIS — S0512XA Contusion of eyeball and orbital tissues, left eye, initial encounter: Secondary | ICD-10-CM | POA: Diagnosis present

## 2013-11-13 DIAGNOSIS — S0285XA Fracture of orbit, unspecified, initial encounter for closed fracture: Secondary | ICD-10-CM

## 2013-11-13 DIAGNOSIS — R55 Syncope and collapse: Secondary | ICD-10-CM | POA: Diagnosis present

## 2013-11-13 DIAGNOSIS — T1490XA Injury, unspecified, initial encounter: Secondary | ICD-10-CM

## 2013-11-13 DIAGNOSIS — Y93K1 Activity, walking an animal: Secondary | ICD-10-CM | POA: Diagnosis not present

## 2013-11-13 DIAGNOSIS — E876 Hypokalemia: Secondary | ICD-10-CM

## 2013-11-13 DIAGNOSIS — C50919 Malignant neoplasm of unspecified site of unspecified female breast: Secondary | ICD-10-CM | POA: Diagnosis present

## 2013-11-13 LAB — URINALYSIS, ROUTINE W REFLEX MICROSCOPIC
Bilirubin Urine: NEGATIVE
GLUCOSE, UA: NEGATIVE mg/dL
HGB URINE DIPSTICK: NEGATIVE
Ketones, ur: NEGATIVE mg/dL
Leukocytes, UA: NEGATIVE
Nitrite: NEGATIVE
PH: 6.5 (ref 5.0–8.0)
Protein, ur: NEGATIVE mg/dL
Specific Gravity, Urine: 1.01 (ref 1.005–1.030)
Urobilinogen, UA: 0.2 mg/dL (ref 0.0–1.0)

## 2013-11-13 LAB — BASIC METABOLIC PANEL
Anion gap: 13 (ref 5–15)
BUN: 9 mg/dL (ref 6–23)
CO2: 27 meq/L (ref 19–32)
Calcium: 9.2 mg/dL (ref 8.4–10.5)
Chloride: 105 mEq/L (ref 96–112)
Creatinine, Ser: 0.75 mg/dL (ref 0.50–1.10)
GFR calc Af Amer: >90 mL/min (ref >90)
GFR calc non Af Amer: 88 mL/min — ABNORMAL LOW (ref >90)
GLUCOSE: 95 mg/dL (ref 70–99)
POTASSIUM: 3.1 meq/L — AB (ref 3.7–5.3)
SODIUM: 145 meq/L (ref 137–147)

## 2013-11-13 LAB — CBC WITH DIFFERENTIAL/PLATELET
Basophils Absolute: 0 10*3/uL (ref 0.0–0.1)
Basophils Relative: 0 % (ref 0–1)
Eosinophils Absolute: 0.1 10*3/uL (ref 0.0–0.7)
Eosinophils Relative: 2 % (ref 0–5)
HCT: 34.1 % — ABNORMAL LOW (ref 36.0–46.0)
Hemoglobin: 10.9 g/dL — ABNORMAL LOW (ref 12.0–15.0)
LYMPHS ABS: 2.3 10*3/uL (ref 0.7–4.0)
LYMPHS PCT: 25 % (ref 12–46)
MCH: 27.2 pg (ref 26.0–34.0)
MCHC: 32 g/dL (ref 30.0–36.0)
MCV: 85 fL (ref 78.0–100.0)
Monocytes Absolute: 0.7 10*3/uL (ref 0.1–1.0)
Monocytes Relative: 8 % (ref 3–12)
NEUTROS PCT: 65 % (ref 43–77)
Neutro Abs: 5.8 10*3/uL (ref 1.7–7.7)
PLATELETS: 202 10*3/uL (ref 150–400)
RBC: 4.01 MIL/uL (ref 3.87–5.11)
RDW: 15.6 % — ABNORMAL HIGH (ref 11.5–15.5)
WBC: 9 10*3/uL (ref 4.0–10.5)

## 2013-11-13 LAB — TROPONIN I

## 2013-11-13 MED ORDER — POTASSIUM CHLORIDE CRYS ER 20 MEQ PO TBCR
EXTENDED_RELEASE_TABLET | ORAL | Status: AC
  Filled 2013-11-13: qty 2

## 2013-11-13 MED ORDER — OXYCODONE-ACETAMINOPHEN 5-325 MG PO TABS
1.0000 | ORAL_TABLET | Freq: Once | ORAL | Status: DC
Start: 2013-11-13 — End: 2013-11-14

## 2013-11-13 MED ORDER — TAMOXIFEN CITRATE 10 MG PO TABS
20.0000 mg | ORAL_TABLET | Freq: Every day | ORAL | Status: DC
  Administered 2013-11-13 – 2013-11-14 (×2): 20 mg via ORAL
  Filled 2013-11-13 (×2): qty 2

## 2013-11-13 MED ORDER — POTASSIUM CHLORIDE 10 MEQ/100ML IV SOLN
INTRAVENOUS | Status: AC
  Filled 2013-11-13: qty 100

## 2013-11-13 MED ORDER — ONDANSETRON HCL 4 MG/2ML IJ SOLN
4.0000 mg | Freq: Four times a day (QID) | INTRAMUSCULAR | Status: DC | PRN
  Administered 2013-11-13: 4 mg via INTRAVENOUS
  Filled 2013-11-13: qty 2

## 2013-11-13 MED ORDER — ONDANSETRON HCL 4 MG/2ML IJ SOLN
INTRAMUSCULAR | Status: AC
  Filled 2013-11-13: qty 2

## 2013-11-13 MED ORDER — MORPHINE SULFATE 4 MG/ML IJ SOLN
4.0000 mg | Freq: Once | INTRAMUSCULAR | Status: AC
  Administered 2013-11-13: 4 mg via INTRAVENOUS

## 2013-11-13 MED ORDER — SODIUM CHLORIDE 0.9 % IV SOLN
Freq: Once | INTRAVENOUS | Status: AC
Start: 2013-11-13 — End: 2013-11-13
  Administered 2013-11-13: 1000 mL via INTRAVENOUS

## 2013-11-13 MED ORDER — OXYCODONE HCL 5 MG PO TABS
5.0000 mg | ORAL_TABLET | ORAL | Status: DC | PRN
  Administered 2013-11-13: 5 mg via ORAL
  Filled 2013-11-13: qty 1

## 2013-11-13 MED ORDER — POTASSIUM CHLORIDE CRYS ER 20 MEQ PO TBCR: 40.0000 meq | EXTENDED_RELEASE_TABLET | Freq: Once | ORAL | Status: DC

## 2013-11-13 MED ORDER — OXYCODONE-ACETAMINOPHEN 5-325 MG PO TABS
ORAL_TABLET | ORAL | Status: AC
  Filled 2013-11-13: qty 1

## 2013-11-13 MED ORDER — SODIUM CHLORIDE 0.9 % IV SOLN
INTRAVENOUS | Status: DC
  Administered 2013-11-13: 11:00:00 via INTRAVENOUS

## 2013-11-13 MED ORDER — SODIUM CHLORIDE 0.9 % IJ SOLN
3.0000 mL | Freq: Two times a day (BID) | INTRAMUSCULAR | Status: DC
  Administered 2013-11-13 – 2013-11-14 (×3): 3 mL via INTRAVENOUS

## 2013-11-13 MED ORDER — ONDANSETRON HCL 4 MG PO TABS: 4.0000 mg | ORAL_TABLET | Freq: Four times a day (QID) | ORAL | Status: DC | PRN

## 2013-11-13 MED ORDER — AMOXICILLIN 250 MG PO CAPS
ORAL_CAPSULE | ORAL | Status: AC
  Filled 2013-11-13: qty 1

## 2013-11-13 MED ORDER — ONDANSETRON HCL 4 MG/2ML IJ SOLN
4.0000 mg | Freq: Once | INTRAMUSCULAR | Status: AC
  Administered 2013-11-13: 4 mg via INTRAVENOUS
  Filled 2013-11-13: qty 2

## 2013-11-13 MED ORDER — BISACODYL 5 MG PO TBEC
10.0000 mg | DELAYED_RELEASE_TABLET | ORAL | Status: DC
  Administered 2013-11-13: 10 mg via ORAL
  Filled 2013-11-13: qty 2

## 2013-11-13 MED ORDER — ONDANSETRON HCL 4 MG/2ML IJ SOLN
4.0000 mg | Freq: Once | INTRAMUSCULAR | Status: AC
  Administered 2013-11-13: 4 mg via INTRAVENOUS

## 2013-11-13 MED ORDER — POTASSIUM CHLORIDE 10 MEQ/100ML IV SOLN
10.0000 meq | Freq: Once | INTRAVENOUS | Status: AC
  Administered 2013-11-13: 10 meq via INTRAVENOUS

## 2013-11-13 MED ORDER — HEPARIN SODIUM (PORCINE) 5000 UNIT/ML IJ SOLN
5000.0000 [IU] | Freq: Three times a day (TID) | INTRAMUSCULAR | Status: DC
  Administered 2013-11-13 – 2013-11-14 (×4): 5000 [IU] via SUBCUTANEOUS
  Filled 2013-11-13 (×4): qty 1

## 2013-11-13 MED ORDER — AMOXICILLIN 250 MG PO CAPS: 1000.0000 mg | ORAL_CAPSULE | Freq: Once | ORAL | Status: DC

## 2013-11-13 MED ORDER — POTASSIUM CHLORIDE 20 MEQ/15ML (10%) PO LIQD
ORAL | Status: AC
  Filled 2013-11-13: qty 30

## 2013-11-13 NOTE — ED Notes (Signed)
Went outside to tend to her dog, woke up on garage floor, crawled into the house. Husband states she couldn't see anything at first. Apparently fell face first on concrete, broke her glasses.

## 2013-11-13 NOTE — ED Notes (Signed)
No further bleeding from nares

## 2013-11-13 NOTE — ED Provider Notes (Signed)
CSN: [id number]     Arrival date & time 11/13/13  0046 History   None    Chief Complaint  Patient presents with  . Loss of Consciousness     (Consider location/radiation/quality/duration/timing/severity/associated sxs/prior Treatment) The history is provided by the patient and the spouse.  63 year old female was brought to the ED after having a syncopal episode at home. She was out walking the dog and doesn't remember anything after that. She was found face down and her garage and the dog was in a pinned up yard. She is complaining of pain in the left side of her face and suffered a nosebleed. She rates pain at 5/10. She denies chest pain, heaviness, tightness, pressure. She denies palpitations.she is complaining of nausea but has not vomited. She has no history of syncope. She is status post bilateral mastectomy for breast cancer and is currently taking tamoxifen.  Past Medical History  Diagnosis Date  . Arthritis     knee  . Blood transfusion   . Cancer     right breast T1cN2  . Allergy   . PONV (postoperative nausea and vomiting)    Past Surgical History  Procedure Laterality Date  . Total knee arthroplasty  2011  . Abdominal hysterectomy  1984  . Port-a-cath removal  07/16/10  . Colonoscopy  05/07/2011    Procedure: COLONOSCOPY;  Surgeon: Rogene Houston, MD;  [facility];  Service: Endoscopy;  Laterality: N/A;  830  . Breast surgery  right MRM, left simple mastectomy  . Breast surgery  left mastectomy   Family History  Problem Relation Age of Onset  . Cancer Father     prostate   History  Substance Use Topics  . Smoking status: Never Smoker   . Smokeless tobacco: Never Used  . Alcohol Use: No   OB History    No data available     Review of Systems  All other systems reviewed and are negative.     Allergies  Codeine  Home Medications   Prior to Admission medications   Medication Sig Start Date End Date Taking? Authorizing Provider  aspirin 81  MG tablet Take 81 mg by mouth daily.    Historical Provider, MD  Bisacodyl (LAXATIVE PO) Take by mouth every other day.      Historical Provider, MD  clotrimazole (GYNE-LOTRIMIN 3) 2 % vaginal cream One applicator intravaginally at bedtime for 3 days 07/01/13   [name], MD  Multiple Vitamins-Minerals (CENTRUM SILVER ULTRA WOMENS PO) Take by mouth.    Historical Provider, MD  tamoxifen (NOLVADEX) 20 MG tablet TAKE ONE TABLET BY MOUTH ONCE DAILY 11/10/13   Virgie Dad Magrinat, MD   BP 101/80 mmHg  Pulse 68  Resp 19  SpO2 98% Physical Exam  Nursing note and vitals reviewed.  63 year old female, resting comfortably and in no acute distress. Vital signs are normal. Oxygen saturation is 98%, which is normal. Head is normocephalic. There is soft tissue swelling and tenderness in the left infraorbital area with mild swelling in the left supraorbital area. Extraocular movements are restricted on the left eye to upward gaze. PERRLA. Oropharynx is clear.TMs are clear without CSF otorrhea or hemotympanum. Blood is present in the left nostril without active bleeding. Septum is in the midline. Neck is nontender supple without adenopathy or JVD. Back is nontender and there is no CVA tenderness. Lungs are clear without rales, wheezes, or rhonchi. Chest is nontender.status post bilateral mastectomy. Heart has regular rate  and rhythm without murmur. Abdomen is soft, flat, nontender without masses or hepatosplenomegaly and peristalsis is normoactive. Extremities have no cyanosis or edema, full range of motion is present. Skin is warm and dry without rash. Neurologic: Mental status is normal, cranial nerves are intact, there are no motor or sensory deficits.  ED Course  Procedures (including critical care time) Labs Review Results for orders placed or performed in visit on 10/03/13  CBC with Differential  Result Value Ref Range   WBC 6.2 3.9 - 10.3 10e3/uL   NEUT# 4.1 1.5 - 6.5 10e3/uL   HGB  11.9 11.6 - 15.9 g/dL   HCT 38.2 34.8 - 46.6 %   Platelets 176 145 - 400 10e3/uL   MCV 84.7 79.5 - 101.0 fL   MCH 26.4 25.1 - 34.0 pg   MCHC 31.2 (L) 31.5 - 36.0 g/dL   RBC 4.51 3.70 - 5.45 10e6/uL   RDW 15.3 (H) 11.2 - 14.5 %   lymph# 1.5 0.9 - 3.3 10e3/uL   MONO# 0.5 0.1 - 0.9 10e3/uL   Eosinophils Absolute 0.2 0.0 - 0.5 10e3/uL   Basophils Absolute 0.0 0.0 - 0.1 10e3/uL   NEUT% 65.0 38.4 - 76.8 %   LYMPH% 23.5 14.0 - 49.7 %   MONO% 8.4 0.0 - 14.0 %   EOS% 2.9 0.0 - 7.0 %   BASO% 0.2 0.0 - 2.0 %  Comprehensive metabolic panel  Result Value Ref Range   Sodium 144 136 - 145 mEq/L   Potassium 3.8 3.5 - 5.1 mEq/L   Chloride 109 98 - 109 mEq/L   CO2 25 22 - 29 mEq/L   Glucose 97 70 - 140 mg/dl   BUN 9.6 7.0 - 26.0 mg/dL   Creatinine 0.8 0.6 - 1.1 mg/dL   Total Bilirubin 0.47 0.20 - 1.20 mg/dL   Alkaline Phosphatase 112 40 - 150 U/L   AST 17 5 - 34 U/L   ALT 7 0 - 55 U/L   Total Protein 7.0 6.4 - 8.3 g/dL   Albumin 3.7 3.5 - 5.0 g/dL   Calcium 9.3 8.4 - 10.4 mg/dL   Anion Gap 10 3 - 11 mEq/L   Imaging Review Ct Head Wo Contrast  11/13/2013   CLINICAL DATA:  Fall.  EXAM: CT HEAD WITHOUT CONTRAST  CT MAXILLOFACIAL WITHOUT CONTRAST  TECHNIQUE: Multidetector CT imaging of the head and maxillofacial structures were performed using the standard protocol without intravenous contrast. Multiplanar CT image reconstructions of the maxillofacial structures were also generated.  COMPARISON:  None.  FINDINGS: CT HEAD FINDINGS  No midline shift, ventriculomegaly, mass effect, evidence of mass lesion, intracranial hemorrhage or evidence of cortically based acute infarction. Gray-white matter differentiation is within normal limits throughout the brain. A fluid level is identified within the left maxillary sinus. There is a blow-out fracture involving the floor of the left orbit.  CT MAXILLOFACIAL FINDINGS  Floor of left orbit fracture is identified. There is a herniation of fat into the left  maxillary sinus. A portion of the inferior rectus muscle appears herniated through the fracture, image 33/series 6. Hemorrhage into the left maxillary sinus is noted. The right orbit is intact. The nasal septum appears midline. The mandible is intact. Asymmetric left-sided preseptal soft tissue swelling is noted.  IMPRESSION: 1. No acute intracranial abnormalities. 2. Left-sided for of orbit fracture.   Electronically Signed   By: Kerby Moors M.D.   On: 11/13/2013 02:01   Ct Maxillofacial Wo Cm  11/13/2013   CLINICAL  DATA:  Fall.  EXAM: CT HEAD WITHOUT CONTRAST  CT MAXILLOFACIAL WITHOUT CONTRAST  TECHNIQUE: Multidetector CT imaging of the head and maxillofacial structures were performed using the standard protocol without intravenous contrast. Multiplanar CT image reconstructions of the maxillofacial structures were also generated.  COMPARISON:  None.  FINDINGS: CT HEAD FINDINGS  No midline shift, ventriculomegaly, mass effect, evidence of mass lesion, intracranial hemorrhage or evidence of cortically based acute infarction. Gray-white matter differentiation is within normal limits throughout the brain. A fluid level is identified within the left maxillary sinus. There is a blow-out fracture involving the floor of the left orbit.  CT MAXILLOFACIAL FINDINGS  Floor of left orbit fracture is identified. There is a herniation of fat into the left maxillary sinus. A portion of the inferior rectus muscle appears herniated through the fracture, image 33/series 6. Hemorrhage into the left maxillary sinus is noted. The right orbit is intact. The nasal septum appears midline. The mandible is intact. Asymmetric left-sided preseptal soft tissue swelling is noted.  IMPRESSION: 1. No acute intracranial abnormalities. 2. Left-sided for of orbit fracture.   Electronically Signed   By: Kerby Moors M.D.   On: 11/13/2013 02:01     EKG Interpretation   Date/Time:  Sunday November 13 2013 01:17:34 EDT Ventricular Rate:   74 PR Interval:  173 QRS Duration: 109 QT Interval:  409 QTC Calculation: 454 R Axis:   51 Text Interpretation:  Sinus rhythm RSR' in V1 or V2, right VCD or RVH No  old tracing to compare Confirmed by [facility]  MD, Kelsen Celona ([id number]) on 11/13/2013  8:18:49 AM      MDM   Final diagnoses:  Syncope, unspecified syncope type  Closed fracture of left orbit, initial encounter  Hypokalemia    Fall with facial trauma. Question syncope. She will be sent for CTs of head, maxillofacial, cervical spine and screening labs obtained.  CT show evidence of blowout fracture of the left orbit. Case was discussed with Dr. Benjamine Mola who stated that he was on call for ENT but not facial trauma. Case is discussed with Dr. Hoyt Koch, who is on-call for facial trauma, who requested that patient be sent to his office for follow-up on Monday, November 2.please note that the fact that she had a blowout fracture with limitation of upward gaze was specifically discussed. She was noted to be hypokalemic and was given oral potassium but she is unable to tolerate this and vomited. She vomited several times in spite of getting parenteral ondansetron.she was given intravenous potassium. She continues to have nausea. Because of concern about question of syncope being the original precipitating event and ongoing nausea, was elected to admit her. Case is discussed with Dr.[facility]tanihave [facility] who will come and see the patient. He suggested she may benefit from transfer to [place], MD 58/52/77 [id number]

## 2013-11-13 NOTE — H&P (Signed)
[facility] History and Physical  Gina Nelson ZSW:[id number] DOB: 05-Jul-1950 DOA: 11/13/2013  Referring physician: ED PCP: Tula Nakayama, MD  Specialists: Optho/maxillofacial surgery  Chief Complaint: Fall  HPI: 63 y/o ?, h/o stg IIIA/II B Breast cancer  01/2010, s/p R Modif rad Mastectomy + L  Mastect in remission, on tamoxifen, Dysphagia, IBS who was walking 11:30 PM 10/31 with her dog. She had left her house and was taking the dog towards graduation which is about 2-300 feet down a hill on their property. She's not sure what may have happened next did not have any prodrome of lightheadedness or the world spinning around her and thinks that she may have either tripped or had her left knee which has osteoarthritis and has had a repair buckle from underneath her. She awoke with blurred vision in the left eye blood streaming down her nose and attempted to crawl back up the driveway. She called out for her husband.she thinks that she was out for maybe a couple of seconds or minutes but has been noted it was around 12 p.m. When he was called. She seemed a little anxious and was talking somewhat incoherently subsequently but is completely coherent right now In the emergency room she had a couple of episodes of nausea and vomiting and felt lightheaded on sitting upI decided that however had been given morphine 4 mg Percocet and potassium as well as amoxicillin and was about to be discharged. CT maxillofacial/head showed lower fracture with limitation of up gaze initially. A portion of the inferiorpractice muscles seem to have herniated through the fracture along with fat herniation to the left maxillary sinus CT of the head showed no acute intracranial abnormalities or midline shift Labs were within normal limits    Review of Systems: The patient denies  Chest pain nausea vomiting chills third vision at present No double vision at present No further dizziness   Past Medical History    Diagnosis Date  . Arthritis     knee  . Blood transfusion   . Cancer     right breast T1cN2  . Allergy   . PONV (postoperative nausea and vomiting)    Past Surgical History  Procedure Laterality Date  . Total knee arthroplasty  2011  . Abdominal hysterectomy  1984  . Port-a-cath removal  07/16/10  . Colonoscopy  05/07/2011    Procedure: COLONOSCOPY;  Surgeon: Rogene Houston, MD;  Location: [facility];  Service: Endoscopy;  Laterality: N/A;  830  . Breast surgery  right MRM, left simple mastectomy  . Breast surgery  left mastectomy   Social History:  History   Social History Narrative  . No narrative on file    Allergies  Allergen Reactions  . Codeine Itching    Arms and face    Family History  Problem Relation Age of Onset  . Cancer Father     prostate    Prior to Admission medications   Medication Sig Start Date End Date Taking? Authorizing Provider  aspirin 81 MG tablet Take 81 mg by mouth daily.    Historical Provider, MD  Bisacodyl (LAXATIVE PO) Take by mouth every other day.      Historical Provider, MD  clotrimazole (GYNE-LOTRIMIN 3) 2 % vaginal cream One applicator intravaginally at bedtime for 3 days 07/01/13   [name], MD  Multiple Vitamins-Minerals (CENTRUM SILVER ULTRA WOMENS PO) Take by mouth.    Historical Provider, MD  tamoxifen (NOLVADEX) 20 MG tablet TAKE ONE  TABLET BY MOUTH ONCE DAILY 11/10/13   Chauncey Cruel, MD   Physical Exam: Filed Vitals:   11/13/13 0630 11/13/13 0700 11/13/13 0730 11/13/13 0742  BP: 130/75 123/69 86/63 101/80  Pulse:      Resp: 13 16 16 19   SpO2:         General:  EOMI extraocular movements intact, pupils are  Eyes: see above  ENT: ecchymosis over the left maxilla with some bruising. Glasses have a abrasion on them.   Neck: soft supple  Cardiovascular: S1-S2 no murmur rub or gallop  Respiratory: clinically clear  Abdomen: nontender nondistended no rebound or guarding  Skin: no lower  extremity edema  Musculoskeletal: range of motion intact  Psychiatric: euthymic and pleasant  Neurologic: power 5/5 no weakness bilaterally, no sensory deficit, finger-nose-fingHEENTer intact, praxis  Labs on Admission:  Basic Metabolic Panel: No results for input(s): NA, K, CL, CO2, GLUCOSE, BUN, CREATININE, CALCIUM, MG, PHOS in the last 168 hours. Liver Function Tests: No results for input(s): AST, ALT, ALKPHOS, BILITOT, PROT, ALBUMIN in the last 168 hours. No results for input(s): LIPASE, AMYLASE in the last 168 hours. No results for input(s): AMMONIA in the last 168 hours. CBC: No results for input(s): WBC, NEUTROABS, HGB, HCT, MCV, PLT in the last 168 hours. Cardiac Enzymes: No results for input(s): CKTOTAL, CKMB, CKMBINDEX, TROPONINI in the last 168 hours.  BNP (last 3 results) No results for input(s): PROBNP in the last 8760 hours. CBG: No results for input(s): GLUCAP in the last 168 hours.  Radiological Exams on Admission: Ct Head Wo Contrast  11/13/2013   CLINICAL DATA:  Fall.  EXAM: CT HEAD WITHOUT CONTRAST  CT MAXILLOFACIAL WITHOUT CONTRAST  TECHNIQUE: Multidetector CT imaging of the head and maxillofacial structures were performed using the standard protocol without intravenous contrast. Multiplanar CT image reconstructions of the maxillofacial structures were also generated.  COMPARISON:  None.  FINDINGS: CT HEAD FINDINGS  No midline shift, ventriculomegaly, mass effect, evidence of mass lesion, intracranial hemorrhage or evidence of cortically based acute infarction. Gray-white matter differentiation is within normal limits throughout the brain. A fluid level is identified within the left maxillary sinus. There is a blow-out fracture involving the floor of the left orbit.  CT MAXILLOFACIAL FINDINGS  Floor of left orbit fracture is identified. There is a herniation of fat into the left maxillary sinus. A portion of the inferior rectus muscle appears herniated through the  fracture, image 33/series 6. Hemorrhage into the left maxillary sinus is noted. The right orbit is intact. The nasal septum appears midline. The mandible is intact. Asymmetric left-sided preseptal soft tissue swelling is noted.  IMPRESSION: 1. No acute intracranial abnormalities. 2. Left-sided for of orbit fracture.   Electronically Signed   By: Kerby Moors M.D.   On: 11/13/2013 02:01   Ct Maxillofacial Wo Cm  11/13/2013   CLINICAL DATA:  Fall.  EXAM: CT HEAD WITHOUT CONTRAST  CT MAXILLOFACIAL WITHOUT CONTRAST  TECHNIQUE: Multidetector CT imaging of the head and maxillofacial structures were performed using the standard protocol without intravenous contrast. Multiplanar CT image reconstructions of the maxillofacial structures were also generated.  COMPARISON:  None.  FINDINGS: CT HEAD FINDINGS  No midline shift, ventriculomegaly, mass effect, evidence of mass lesion, intracranial hemorrhage or evidence of cortically based acute infarction. Gray-white matter differentiation is within normal limits throughout the brain. A fluid level is identified within the left maxillary sinus. There is a blow-out fracture involving the floor of the left orbit.  CT  MAXILLOFACIAL FINDINGS  Floor of left orbit fracture is identified. There is a herniation of fat into the left maxillary sinus. A portion of the inferior rectus muscle appears herniated through the fracture, image 33/series 6. Hemorrhage into the left maxillary sinus is noted. The right orbit is intact. The nasal septum appears midline. The mandible is intact. Asymmetric left-sided preseptal soft tissue swelling is noted.  IMPRESSION: 1. No acute intracranial abnormalities. 2. Left-sided for of orbit fracture.   Electronically Signed   By: Kerby Moors M.D.   On: 11/13/2013 02:01    EKG: Independently reviewed. Nsr, pr 0.12, qrs 60 degrees, no st-t inversions, rbb,  Assessment/Plan Principal Problem:   Syncope and collapse-unclear etiology and resulting in  mechanical fall. She does not have any strong risk factors in terms of arrhythmia or heart disease-based on the [place] syncopal symptom score she does not have any risk factors with a benign EKG so workup with echocardiogram and MRI is not currently indicated however observation is likely needed at least overnight  but given the severity of her symptoms and the fall, as well as what seems like post concussive confusion, we will admit her overnight and reevaluate in the morning    Fracture of orbital floor, blow-out, left, closed-I have confirmed with Dr. Justin Mend of maxillofacial surgery on the telephone what his thoughts are. He says that patient can be seen tomorrow afternoon in his office and we'll set up an appointment. His thought processes that patient may have a little bit of nerve entrapment from the edema which may require eventual surgery but this is not emergent based on my detailing exam findings to the patient.   I have also spoken with Dr. Katy Apo of ophthalmology who confirms that patient may also need to be seen by him in follow-up as there may be some damage to the actual eye. And I have left this information for follow-upI will let family know.. I will hold her aspirin for now but continue her other meds\   Breast cancer-right T2N1.with outpatient management as per oncologist, continue tamoxifen    Time spent: 75 minutes Presumed full code Admit to telemetry overnight in patient  [place], [facility] Pager [phone]  If 7PM-7AM, please contact night-coverage [email or web address] Password [email or web address] 11/13/2013, 8:13 AM

## 2013-11-14 LAB — GLUCOSE, CAPILLARY: Glucose-Capillary: 73 mg/dL (ref 70–99)

## 2013-11-14 MED ORDER — POTASSIUM CHLORIDE CRYS ER 20 MEQ PO TBCR: 40.0000 meq | EXTENDED_RELEASE_TABLET | Freq: Two times a day (BID) | ORAL | Status: DC

## 2013-11-14 MED ORDER — AMOXICILLIN-POT CLAVULANATE 875-125 MG PO TABS: 1.0000 | ORAL_TABLET | Freq: Two times a day (BID) | ORAL | Status: DC

## 2013-11-14 MED ORDER — POTASSIUM CHLORIDE CRYS ER 20 MEQ PO TBCR
40.0000 meq | EXTENDED_RELEASE_TABLET | Freq: Two times a day (BID) | ORAL | Status: DC
  Administered 2013-11-14: 40 meq via ORAL
  Filled 2013-11-14: qty 2

## 2013-11-14 NOTE — Discharge Planning (Signed)
Pt stated she was ready to be [facility] and she had no pain.  Pt IV and tele DCd.  Pt informed of requested FU appointments and what to do when she feel dizzy or lightheaded.  Also when to call Dr or return to the hospital.  Pt will be wheeled to car when ride arrives.

## 2013-11-14 NOTE — Evaluation (Signed)
Physical Therapy Evaluation Patient Details Name: Gina Nelson MRN: [id number] DOB: 1950-03-06 Today's Date: 11/14/2013   History of Present Illness  63 y/o ?, h/o stg IIIA/II B Breast cancer graphic 01/2010, s/p R Modif rad Mastectomy + L Mastect in remission, on tamoxifen, Dysphagia, IBS who was walking 11:30 PM 10/31 with her dog. She had left her house and was taking the dog towards [place] which is about 2-300 feet down a [place] on their property. She's not sure what may have happened next did not have any prodrome of lightheadedness or the world spinning around her and thinks that she may have either tripped or had her left knee which has osteoarthritis and has had a repair buckle from underneath her.  She awoke with blurred vision in the left eye blood streaming down her nose and attempted to crawl back up the driveway. She called out for her husband.she thinks that she was out for maybe a couple of seconds or minutes but has been noted it was around 12 p.m. When he was called. She seemed a little anxious and was talking somewhat incoherently subsequently but is completely coherent right now.  CT maxillofacial/head showed lower fracture with limitation of up gaze initially. A portion of the inferiorpractice muscles seem to have herniated through the fracture along with fat herniation to the left maxillary sinus  Clinical Impression  Pt is a 63 year old female who presents to PT after a fall in the yard.  Pt reports a hx of Rt knee giving way since knee surgery in 2011, though no hx of falls or dizziness prior to this incidence.  During evaluation, pt as (I) with bed mobility skills and transfers, and min guard -> supervision for gait in the hallways.  Noted decreased cadence which pt reports to difficulty with vision in the Lt eye; educated pt on safe techniques for gait due to impaired vision at this time. Pt currently at baseline level of function and will be discharged form acute PT services.   No follow up or DME recommendations.     Follow Up Recommendations No PT follow up    Equipment Recommendations  None recommended by PT    Recommendations for Other Services OT consult     Precautions / Restrictions Precautions Precautions: Fall Restrictions Weight Bearing Restrictions: No      Mobility  Bed Mobility Overal bed mobility: Independent                Transfers Overall transfer level: Independent                  Ambulation/Gait Ambulation/Gait assistance: Supervision;Min guard Ambulation Distance (Feet): 450 Feet Assistive device: None Gait Pattern/deviations: Step-through pattern;Decreased dorsiflexion - right;Decreased dorsiflexion - left   Gait velocity interpretation: Below normal speed for age/gender General Gait Details: Pt reports decrease in cadence secondary to Lt eye watering/swelling      Balance Overall balance assessment: History of Falls;Needs assistance Sitting-balance support: No upper extremity supported;Feet supported Sitting balance-Leahy Scale: Normal     Standing balance support: No upper extremity supported;During functional activity Standing balance-Leahy Scale: Good                               Pertinent Vitals/Pain Pain Assessment: No/denies pain    Home Living Family/patient expects to be discharged to:: Private residence Living Arrangements: Spouse/significant other Available Help at Discharge: Family;Available 24 hours/day Type of Home: [place]  Access: Level entry     Home Layout: One level Home Equipment: [place] - 2 wheels;Cane - single point      Prior Function Level of Independence: Independent         Comments: Pt reports she is (I) with bed mobility skills, transfers, and gait without AD.  Pt still drives and completes all ADLs and IALDs (I).      Hand Dominance   Dominant Hand: Right    Extremity/Trunk Assessment   Upper Extremity Assessment: Defer to OT  evaluation           Lower Extremity Assessment: Overall WFL for tasks assessed         Communication   Communication: No difficulties  Cognition Arousal/Alertness: Awake/alert Behavior During Therapy: WFL for tasks assessed/performed Overall Cognitive Status: Within Functional Limits for tasks assessed                        Assessment/Plan    PT Assessment Patent does not need any further PT services  PT Diagnosis     PT Problem List    PT Treatment Interventions     PT Goals (Current goals can be found in the Care Plan section) Acute Rehab PT Goals PT Goal Formulation: All assessment and education complete, DC therapy     End of Session Equipment Utilized During Treatment: Gait belt Activity Tolerance: Patient tolerated treatment well Patient left: in bed;with call bell/phone within reach;with bed alarm set           Time: 2025-4270 PT Time Calculation (min): 33 min   Charges:   PT Evaluation $Initial PT Evaluation Tier I: 1 Procedure PT Treatments $Self Care/Home Management: 8-22 (Educated pt on techniques for safe gait with Lt eye injury (avoiding uneven terrain, long distances, driving, use of AD as needed))   Gina Nelson 11/14/2013, 9:35 AM

## 2013-11-14 NOTE — Progress Notes (Signed)
OT Cancellation Note  Patient Details Name: Gina Nelson MRN: [id number] DOB: [birth year]   Cancelled Treatment:     OT screened.  No needs identified.  Will sign off.  Pt demonstrated WFL strength, ROM, sensation, and coordination.  She is at baseline with ADL skills.  Pt had no concerns about ADL engagement after d/c home.  Pt needs no further acute OT services at this time.     Bea Graff Tabithia Stroder, MS, OTR/L [facility] [phone] 11/14/2013, 9:34 AM

## 2013-11-14 NOTE — Care Management Utilization Note (Signed)
UR completed 

## 2013-11-14 NOTE — Discharge Summary (Signed)
Physician Discharge Summary  JHANA GIARRATANO ZOX:[id number] DOB: 10-04-1950 DOA: 11/13/2013  PCP: Tula Nakayama, MD  Admit date: 11/13/2013 Discharge date: 11/14/2013  Time spent: 40 minutes  Recommendations for Outpatient Follow-up:  1. Follow up with Dr Hoyt Koch 11/14/13 as directed  2. Follow up with Dr Prudencio Burly 1 week  Discharge Diagnoses:  Principal Problem:   Syncope and collapse Active Problems:   Breast cancer-right T2N1.   Fracture of orbital floor, blow-out, left, closed   Discharge Condition: stable  Diet recommendation: regular  Filed Weights   11/13/13 0959 11/14/13 0500  Weight: 75.751 kg (167 lb) 81.4 kg (179 lb 7.3 oz)    History of present illness:   63 year old who was walking 11:30 PM 10/31 with her dog. She had left her house and was taking the dog towards graduation which was about 2-3 feet down a hill on [place] property. She was not sure what may have happened next did not have any prodrome of lightheadedness or the world spinning around her. Believes she may have either tripped or had her left knee which has osteoarthritis and has had a repair buckle from underneath her.  She awoke with blurred vision in the left eye blood streaming down her nose and attempted to crawl back up the driveway. She called out for her husband.she believes that she was out for maybe a couple of seconds or minutes but has been noted it was around 12 p.m. When he was called. She seemed a little anxious and was talking somewhat incoherently subsequently but is completely coherent on admission In the emergency room she had a couple of episodes of nausea and vomiting and felt lightheaded on sitting up decided to admit. She was given morphine  and  Percocet and potassium as well as amoxicillin in ED CT maxillofacial/head showed lower fracture with limitation of up gaze initially. A portion of the inferiorpractice muscles seemed to have herniated through the fracture along with fat herniation to  the left maxillary sinus CT of the head showed no acute intracranial abnormalities or midline shift Labs were within normal limits  Hospital Course:  Syncope and collapse-unclear etiology and resulting in fall.  She does not have any strong risk factors in terms of arrhythmia or heart disease-based on the [place] syncopal symptom score she does not have any risk factors with a benign EKG so workup with echocardiogram and MRI is not indicated. No events on tele. Not orthostatic. At discharge ambulated in hall with minimal assist. Recommend follow up with PCP 1-2 weeks. Consider BMET to track potassium   Fracture of orbital floor, blow-out, left, closed- follow up with Dr Hoyt Koch of maxillofacial surgery on day of discharge. He opined that patient may have a little bit of nerve entrapment from the edema which may require eventual surgery but this is not emergent. Will also need follow up with Dr Prudencio Burly opthamology.    Breast cancer-right T2N1.with outpatient management as per oncologist, continue tamoxifen   Procedures:  none  Consultations:  Dr Hoyt Koch phone consult  Discharge Exam: Filed Vitals:   11/14/13 0930  BP: 137/62  Pulse:   Temp:   Resp:     General: well nourished appears comfortable Cardiovascular: RRR No MGR No LE edema Respiratory: normal effort BS clear bilaterally no wheeze Head: left maxilla with bruising small amount swelling. EOMI  Discharge Instructions You were cared for by a hospitalist during your hospital stay. If you have any questions about your discharge medications or the care  you received while you were in the hospital after you are discharged, you can call the unit and asked to speak with the hospitalist on call if the hospitalist that took care of you is not available. Once you are discharged, your primary care physician will handle any further medical issues. Please note that NO REFILLS for any discharge medications will be authorized once you are  discharged, as it is imperative that you return to your primary care physician (or establish a relationship with a primary care physician if you do not have one) for your aftercare needs so that they can reassess your need for medications and monitor your lab values.  Discharge Instructions    Diet - low sodium heart healthy    Complete by:  As directed      Discharge instructions    Complete by:  As directed   Follow up with PCP 2 weeks for evaluation of symptoms. Recommend BMET to track potassium level.  Keep OP appointments for follow up as scheduled.     Increase activity slowly    Complete by:  As directed           Current Discharge Medication List    CONTINUE these medications which have NOT CHANGED   Details  acetaminophen (TYLENOL) 500 MG tablet Take 1,000 mg by mouth every 6 (six) hours as needed for headache.    aspirin 81 MG tablet Take 81 mg by mouth daily.    Bisacodyl (LAXATIVE PO) Take 1-2 tablets by mouth at bedtime.     tamoxifen (NOLVADEX) 20 MG tablet Take 20 mg by mouth daily.    Multiple Vitamins-Minerals (CENTRUM SILVER ULTRA WOMENS PO) Take by mouth.       Allergies  Allergen Reactions  . Codeine Itching    Arms and face   Follow-up Information    Follow up with Katy Apo, MD.   Specialty:  Ophthalmology   Contact information:   [place] [address] [phone]       Follow up with Gae Bon, DDS On 11/14/2013.   Specialty:  Oral Surgery   Why:  please arrive by 3pm   Contact information:   [address] [place] [address] [phone]        The results of significant diagnostics from this hospitalization (including imaging, microbiology, ancillary and laboratory) are listed below for reference.    Significant Diagnostic Studies: Ct Head Wo Contrast  11/13/2013   CLINICAL DATA:  Fall.  EXAM: CT HEAD WITHOUT CONTRAST  CT MAXILLOFACIAL WITHOUT CONTRAST  TECHNIQUE: Multidetector CT imaging of the head and  maxillofacial structures were performed using the standard protocol without intravenous contrast. Multiplanar CT image reconstructions of the maxillofacial structures were also generated.  COMPARISON:  None.  FINDINGS: CT HEAD FINDINGS  No midline shift, ventriculomegaly, mass effect, evidence of mass lesion, intracranial hemorrhage or evidence of cortically based acute infarction. Gray-white matter differentiation is within normal limits throughout the brain. A fluid level is identified within the left maxillary sinus. There is a blow-out fracture involving the floor of the left orbit.  CT MAXILLOFACIAL FINDINGS  Floor of left orbit fracture is identified. There is a herniation of fat into the left maxillary sinus. A portion of the inferior rectus muscle appears herniated through the fracture, image 33/series 6. Hemorrhage into the left maxillary sinus is noted. The right orbit is intact. The nasal septum appears midline. The mandible is intact. Asymmetric left-sided preseptal soft tissue swelling is noted.  IMPRESSION: 1. No acute intracranial abnormalities. 2. Left-sided for of orbit fracture.   Electronically Signed   By: Kerby Moors M.D.   On: 11/13/2013 02:01   Ct Maxillofacial Wo Cm  11/13/2013   CLINICAL DATA:  Fall.  EXAM: CT HEAD WITHOUT CONTRAST  CT MAXILLOFACIAL WITHOUT CONTRAST  TECHNIQUE: Multidetector CT imaging of the head and maxillofacial structures were performed using the standard protocol without intravenous contrast. Multiplanar CT image reconstructions of the maxillofacial structures were also generated.  COMPARISON:  None.  FINDINGS: CT HEAD FINDINGS  No midline shift, ventriculomegaly, mass effect, evidence of mass lesion, intracranial hemorrhage or evidence of cortically based acute infarction. Gray-white matter differentiation is within normal limits throughout the brain. A fluid level is identified within the left maxillary sinus. There is a blow-out fracture involving the floor of  the left orbit.  CT MAXILLOFACIAL FINDINGS  Floor of left orbit fracture is identified. There is a herniation of fat into the left maxillary sinus. A portion of the inferior rectus muscle appears herniated through the fracture, image 33/series 6. Hemorrhage into the left maxillary sinus is noted. The right orbit is intact. The nasal septum appears midline. The mandible is intact. Asymmetric left-sided preseptal soft tissue swelling is noted.  IMPRESSION: 1. No acute intracranial abnormalities. 2. Left-sided for of orbit fracture.   Electronically Signed   By: Kerby Moors M.D.   On: 11/13/2013 02:01    Microbiology: No results found for this or any previous visit (from the past 240 hour(s)).   Labs: Basic Metabolic Panel:  Recent Labs Lab 11/13/13 0803  NA 145  K 3.1*  CL 105  CO2 27  GLUCOSE 95  BUN 9  CREATININE 0.75  CALCIUM 9.2   Liver Function Tests: No results for input(s): AST, ALT, ALKPHOS, BILITOT, PROT, ALBUMIN in the last 168 hours. No results for input(s): LIPASE, AMYLASE in the last 168 hours. No results for input(s): AMMONIA in the last 168 hours. CBC:  Recent Labs Lab 11/13/13 0803  WBC 9.0  NEUTROABS 5.8  HGB 10.9*  HCT 34.1*  MCV 85.0  PLT 202   Cardiac Enzymes:  Recent Labs Lab 11/13/13 0803  TROPONINI <0.30   BNP: BNP (last 3 results) No results for input(s): PROBNP in the last 8760 hours. CBG:  Recent Labs Lab 11/14/13 0526  GLUCAP 73       Signed:  Wahneta Derocher M  [facility] 11/14/2013, 1:35 PM

## 2013-11-22 ENCOUNTER — Other Ambulatory Visit: Payer: Self-pay

## 2013-11-22 ENCOUNTER — Telehealth: Payer: Self-pay | Admitting: *Deleted

## 2013-11-22 DIAGNOSIS — N760 Acute vaginitis: Secondary | ICD-10-CM

## 2013-11-22 MED ORDER — FLUCONAZOLE 150 MG PO TABS: ORAL_TABLET | ORAL | Status: AC

## 2013-11-22 NOTE — Telephone Encounter (Signed)
Pt called and LMOM that she was admitted into the hospital 11/13/13-and d/c 11/14/13 patient said she had a bad fall and cracked the bone under her eye per pt the doctor put her on the potassium pills with an antibiotic in it, patient states she is now having itching and tenderness in her vagina, pt is requesting something for it. Please advise [phone]

## 2013-11-22 NOTE — Telephone Encounter (Signed)
Med sent and patient aware 

## 2013-11-22 NOTE — Telephone Encounter (Signed)
Is it ok to send fluconazole?

## 2013-11-22 NOTE — Telephone Encounter (Signed)
Yes, pls also let her know that I hope she is improving , ask who is following her after her hospitalization, I assume ENT,  Should also have eval her as w/u for unintentional fall?? LOC, UNLESS she has clear recall of how or why she fell, send me a msg back pls

## 2013-11-23 ENCOUNTER — Other Ambulatory Visit: Payer: Self-pay | Admitting: Family Medicine

## 2014-02-13 ENCOUNTER — Other Ambulatory Visit: Payer: Self-pay | Admitting: Oncology

## 2014-02-22 LAB — CBC WITH DIFFERENTIAL/PLATELET
Basophils Absolute: 0 10*3/uL (ref 0.0–0.1)
Basophils Relative: 0 % (ref 0–1)
Eosinophils Absolute: 0.2 10*3/uL (ref 0.0–0.7)
Eosinophils Relative: 3 % (ref 0–5)
HEMATOCRIT: 37.1 % (ref 36.0–46.0)
HEMOGLOBIN: 11.7 g/dL — AB (ref 12.0–15.0)
LYMPHS ABS: 2 10*3/uL (ref 0.7–4.0)
LYMPHS PCT: 26 % (ref 12–46)
MCH: 26.7 pg (ref 26.0–34.0)
MCHC: 31.5 g/dL (ref 30.0–36.0)
MCV: 84.5 fL (ref 78.0–100.0)
Monocytes Absolute: 0.5 10*3/uL (ref 0.1–1.0)
Monocytes Relative: 6 % (ref 3–12)
NEUTROS ABS: 4.9 10*3/uL (ref 1.7–7.7)
Neutrophils Relative %: 65 % (ref 43–77)
Platelets: 202 10*3/uL (ref 150–400)
RBC: 4.39 MIL/uL (ref 3.87–5.11)
RDW: 14.7 % (ref 11.5–15.5)
WBC: 7.5 10*3/uL (ref 4.0–10.5)

## 2014-02-22 LAB — COMPREHENSIVE METABOLIC PANEL
ALK PHOS: 102 U/L (ref 39–117)
ALT: 9 U/L (ref 0–35)
AST: 18 U/L (ref 0–37)
Albumin: 4.1 g/dL (ref 3.5–5.2)
BILIRUBIN TOTAL: 0.5 mg/dL (ref 0.2–1.2)
BUN: 9 mg/dL (ref 6–23)
CO2: 27 mEq/L (ref 19–32)
CREATININE: 0.77 mg/dL (ref 0.50–1.10)
Calcium: 9 mg/dL (ref 8.4–10.5)
Chloride: 107 mEq/L (ref 96–112)
GLUCOSE: 80 mg/dL (ref 70–99)
Potassium: 4 mEq/L (ref 3.5–5.3)
SODIUM: 144 meq/L (ref 135–145)
TOTAL PROTEIN: 6.1 g/dL (ref 6.0–8.3)

## 2014-02-22 LAB — LIPID PANEL
Cholesterol: 154 mg/dL (ref 0–200)
HDL: 64 mg/dL (ref >39)
LDL Cholesterol: 78 mg/dL (ref 0–99)
TRIGLYCERIDES: 61 mg/dL (ref <150)
Total CHOL/HDL Ratio: 2.4 Ratio
VLDL: 12 mg/dL (ref 0–40)

## 2014-02-22 LAB — HEMOGLOBIN A1C
Hgb A1c MFr Bld: 5.9 % — ABNORMAL HIGH (ref <5.7)
Mean Plasma Glucose: 123 mg/dL — ABNORMAL HIGH (ref <117)

## 2014-02-22 LAB — TSH: TSH: 0.822 u[IU]/mL (ref 0.350–4.500)

## 2014-02-27 ENCOUNTER — Encounter: Payer: BC Managed Care – PPO | Admitting: Family Medicine

## 2014-02-28 ENCOUNTER — Encounter: Payer: Self-pay | Admitting: Family Medicine

## 2014-02-28 ENCOUNTER — Other Ambulatory Visit (HOSPITAL_COMMUNITY)
Admission: RE | Admit: 2014-02-28 | Discharge: 2014-02-28 | Disposition: A | Discharge status: Home / Self Care | Payer: BC Managed Care – PPO | Source: Ambulatory Visit | Attending: Family Medicine | Admitting: Family Medicine

## 2014-02-28 ENCOUNTER — Ambulatory Visit (INDEPENDENT_AMBULATORY_CARE_PROVIDER_SITE_OTHER): Payer: BC Managed Care – PPO | Admitting: Family Medicine

## 2014-02-28 VITALS — BP 110/78 | HR 84 | Resp 18 | Ht 65.0 in | Wt 172.0 lb

## 2014-02-28 DIAGNOSIS — Z1211 Encounter for screening for malignant neoplasm of colon: Secondary | ICD-10-CM

## 2014-02-28 DIAGNOSIS — N760 Acute vaginitis: Secondary | ICD-10-CM | POA: Insufficient documentation

## 2014-02-28 DIAGNOSIS — Z0000 Encounter for general adult medical examination without abnormal findings: Secondary | ICD-10-CM | POA: Insufficient documentation

## 2014-02-28 DIAGNOSIS — R7303 Prediabetes: Secondary | ICD-10-CM

## 2014-02-28 LAB — POC HEMOCCULT BLD/STL (OFFICE/1-CARD/DIAGNOSTIC): FECAL OCCULT BLD: NEGATIVE

## 2014-02-28 NOTE — Assessment & Plan Note (Signed)

## 2014-02-28 NOTE — Patient Instructions (Addendum)
F/u in  4.5 month, please call if you need me before  You will receive information on foods rich in potassium  You  Will receive information on carbohydrate counting to help with blood sugar control  I will be in touch with Dr [name] then I will get back in touch with you  You will be contacted if you need treament for vaginal yeast infection, a swab was sent  Pls continue to work on regular exercise  Congrats on improved blood sugar , keep pressing toward a normal blood sugar  I recommend cardiology evaluation to see if there is any underlying problem with your heart to explain the episode that you had last fall when you passsed out. Pls call and let me know if you decide to have this , i will refer you at that time  HBa1C in 4.5 month and chem 7, non fast

## 2014-02-28 NOTE — Progress Notes (Signed)
   Subjective:    Patient ID: Gina Nelson, female    DOB: Jun 05, 1950, 64 y.o.   MRN: [id number]  HPI Patient is in for annual physical exam. The fact that she had a syncopal episode last Fall, which resulted in right orbital fracture is discussed. I recommended further evaluation by cardiology, she is considering this and will call back if she decides on this . In her opinion , her low potassium was  Stated as the cause, I explained tha in my experience I do not think that the potassium she presented with wa so low as to adequately explain the event . Of note also she was anemic at time of admission ,has since corrected, , diverticulosis on recent colonoscopy, np o recall by pt of visible rectal bleeding, also had 2 tubular adenoma removed in 2013 [facility] feels well and has no complaints/ concerns currently Has started bile riding for improved health reports fatigue after 5 to 7 mins, but is working on this  Review of Systems See HPI     Objective:   Physical Exam   BP 110/78 mmHg  Pulse 84  Resp 18  Ht 5\' 5"  (1.651 m)  Wt 172 lb (78.019 kg)  BMI 28.62 kg/m2  SpO2 97% Pleasant well nourished female, alert and oriented x 3, in no cardio-pulmonary distress. Afebrile. HEENT No facial trauma or asymetry. Sinuses non tender.  Extra occullar muscles intact, pupils equally reactive to light. External ears normal, tympanic membranes clear. Oropharynx moist, no exudate fairly , good dentition. Neck: supple, no adenopathy,JVD or thyromegaly.No bruits.  Chest: Clear to ascultation bilaterally.No crackles or wheezes. Non tender to palpation  Breast: Bilateral mastectomy, no masses or lumps. No tenderness. . No axillary or supraclavicular adenopathy  Cardiovascular system; Heart sounds normal,  S1 and  S2 ,no S3.  No murmur, or thrill. Apical beat not displaced Peripheral pulses normal.  Abdomen: Soft, non tender, no organomegaly or masses. No bruits. Bowel sounds normal. No  guarding, tenderness or rebound.  Rectal:  Normal sphincter tone. No mass.No rectal masses.  Guaiac negative stool.  GU: External genitalia normal female genitalia , female distribution of hair. No lesions. Urethral meatus normal in size, no  Prolapse, no lesions visibly  Present. Bladder non tender. Vagina pink and moist , with no visible lesions ,cottage cheese  discharge present . Adequate pelvic support no  cystocele or rectocele noted Cervix absent, no lesions or ulcerations noted Uterus absent , no adnexal masses, no  adnexal tenderness.   Musculoskeletal exam: Full ROM of spine, hips , shoulders and knees. No deformity ,swelling or crepitus noted. No muscle wasting or atrophy.   Neurologic: Cranial nerves 2 to 12 intact. Power, tone ,sensation and reflexes normal throughout. No disturbance in gait. No tremor.  Skin: Intact, no ulceration, erythema , scaling or rash noted.Healing laceration on left leg where scraped by wood recently Pigmentation normal throughout  Psych; Normal mood and affect. Judgement and concentration normal       Assessment & Plan:  Annual physical exam Annual exam as documented. Counseling done  re healthy lifestyle involving commitment to 150 minutes exercise per week, heart healthy diet, and attaining healthy weight.The importance of adequate sleep also discussed. Regular seat belt use and home safety, is also discussed. Changes in health habits are decided on by the patient with goals and time frames  set for achieving them. Immunization and cancer screening needs are specifically addressed at this visit.

## 2014-03-02 LAB — CERVICOVAGINAL ANCILLARY ONLY
WET PREP (BD AFFIRM): NEGATIVE
WET PREP (BD AFFIRM): NEGATIVE
Wet Prep (BD Affirm): NEGATIVE

## 2014-04-19 ENCOUNTER — Encounter: Payer: Self-pay | Admitting: General Surgery

## 2014-04-19 NOTE — Progress Notes (Signed)
Patient ID: Gina Nelson, female   DOB: 08/22/1950, 64 y.o.   MRN: [id number] Gina Nelson 04/19/2014 11:03 AM Location: [facility] Patient #: [id number] DOB: 06-26-1950 Married / Language: English / Race: Black or African American Female History of Present Illness Odis Hollingshead MD; 04/19/2014 11:19 AM) Patient words: breast f/u.  The patient is a 64 year old female    Note:Operation: Right MRM, Left SM for Right breast CA  Date: 06/27/2010  Stage: T1cN2 right breast cancer  Hormone receptor status: Positive  HPI: Gina Nelson is here for long term follow up of her right breast cancer. She's had no further pain in her chest wall. She denies any nodules in the chest wall. She is tolerating the tamoxifen.  PE: Gen-she looks well  Chest: Right side demonstrates XRT changes to the skin; no nodules. Left side demonstrates no nodules.  Lymph nodes: No palpable cervical, supraclavicular, or axillary adenopathy.  Other Problems Marjean Donna, CMA; 04/19/2014 11:03 AM) Breast Cancer Lump In Breast  Past Surgical History Marjean Donna, CMA; 04/19/2014 11:03 AM) Breast Biopsy Right. Hysterectomy (not due to cancer) - Partial Knee Surgery Right. Mastectomy Bilateral.  Diagnostic Studies History Marjean Donna, CMA; 04/19/2014 11:03 AM) Colonoscopy 1-5 years ago Mammogram >3 years ago Pap Smear 1-5 years ago  Allergies Marjean Donna, CMA; 04/19/2014 11:04 AM) Codeine Phosphate *ANALGESICS - OPIOID*  Medication History (Sonya Bynum, CMA; 04/19/2014 11:06 AM) Aspirin EC (81MG  Tablet DR, Oral) Active. Multiple Vitamin (Oral) Active. Tamoxifen Citrate (20MG  Tablet, Oral) Active. Medications Reconciled  Social History Marjean Donna, CMA; 04/19/2014 11:03 AM) Caffeine use Tea. No alcohol use No drug use Tobacco use Never smoker.  Family History Marjean Donna, [place]; 04/19/2014 11:03 AM) Alcohol Abuse Father. Hypertension Mother. Malignant Neoplasm Of  Pancreas Sister. Prostate Cancer Father.  Pregnancy / Birth History Marjean Donna, [place]; 04/19/2014 11:03 AM) Age at menarche 71 years. Age of menopause <45 Gravida 1 Irregular periods Maternal age 38-20 Para 1     Review of Systems ([place]; 04/19/2014 11:03 AM) General Not Present- Appetite Loss, Chills, Fatigue, Fever, Night Sweats, Weight Gain and Weight Loss. Skin Not Present- Change in Wart/Mole, Dryness, Hives, Jaundice, New Lesions, Non-Healing Wounds, Rash and Ulcer. HEENT Present- Seasonal Allergies and Wears glasses/contact lenses. Not Present- Earache, Hearing Loss, Hoarseness, Nose Bleed, Oral Ulcers, Ringing in the Ears, Sinus Pain, Sore Throat, Visual Disturbances and Yellow Eyes. Respiratory Not Present- Bloody sputum, Chronic Cough, Difficulty Breathing, Snoring and Wheezing. Breast Not Present- Breast Mass, Breast Pain, Nipple Discharge and Skin Changes. Cardiovascular Not Present- Chest Pain, Difficulty Breathing Lying Down, Leg Cramps, Palpitations, Rapid Heart Rate, Shortness of Breath and Swelling of Extremities. Gastrointestinal Not Present- Abdominal Pain, Bloating, Bloody Stool, Change in Bowel Habits, Chronic diarrhea, Constipation, Difficulty Swallowing, Excessive gas, Gets full quickly at meals, Hemorrhoids, Indigestion, Nausea, Rectal Pain and Vomiting. Female Genitourinary Not Present- Frequency, Nocturia, Painful Urination, Pelvic Pain and Urgency. Musculoskeletal Not Present- Back Pain, Joint Pain, Joint Stiffness, Muscle Pain, Muscle Weakness and Swelling of Extremities. Neurological Not Present- Decreased Memory, Fainting, Headaches, Numbness, Seizures, Tingling, Tremor, Trouble walking and Weakness. Psychiatric Not Present- Anxiety, Bipolar, Change in Sleep Pattern, Depression, Fearful and Frequent crying. Endocrine Not Present- Cold Intolerance, Excessive Hunger, Hair Changes, Heat Intolerance, Hot flashes and New Diabetes. Hematology Not  Present- Easy Bruising, Excessive bleeding, Gland problems, HIV and Persistent Infections.  Vitals (Sonya Bynum CMA; 04/19/2014 11:04 AM) 04/19/2014 11:03 AM Weight: 172 lb Height: 66in Body Surface Area: 1.91 m  Body Mass Index: 27.76 kg/m Temp.: 98.1F(Temporal)  Pulse: 63 (Regular)  BP: 128/80 (Sitting, Left Arm, Standard)     Assessment & Plan Odis Hollingshead MD; 04/19/2014 11:20 AM)  HISTORY OF CANCER OF RIGHT BREAST (V10.3  Z85.3) Impression: Assessment: Right breast cancer- no clinical evidence of recurrence.  Plan: Return visit one year.  Current Plans Follow up in 1 year or as needed Free Text Instructions : discussed with patient and provided information.  [name], MD

## 2014-05-15 ENCOUNTER — Other Ambulatory Visit: Payer: Self-pay | Admitting: Oncology

## 2014-07-19 LAB — HEMOGLOBIN A1C
HEMOGLOBIN A1C: 6 % — AB (ref <5.7)
Mean Plasma Glucose: 126 mg/dL — ABNORMAL HIGH (ref <117)

## 2014-07-20 LAB — BASIC METABOLIC PANEL
BUN: 8 mg/dL (ref 6–23)
CALCIUM: 8.8 mg/dL (ref 8.4–10.5)
CO2: 25 mEq/L (ref 19–32)
Chloride: 109 mEq/L (ref 96–112)
Creat: 0.71 mg/dL (ref 0.50–1.10)
GLUCOSE: 84 mg/dL (ref 70–99)
Potassium: 3.7 mEq/L (ref 3.5–5.3)
SODIUM: 145 meq/L (ref 135–145)

## 2014-07-25 ENCOUNTER — Encounter: Payer: Self-pay | Admitting: Family Medicine

## 2014-07-25 ENCOUNTER — Ambulatory Visit (INDEPENDENT_AMBULATORY_CARE_PROVIDER_SITE_OTHER): Payer: BC Managed Care – PPO | Admitting: Family Medicine

## 2014-07-25 VITALS — BP 110/70 | HR 71 | Resp 16 | Ht 65.0 in | Wt 167.1 lb

## 2014-07-25 DIAGNOSIS — Z723 Lack of physical exercise: Secondary | ICD-10-CM | POA: Diagnosis not present

## 2014-07-25 DIAGNOSIS — S5291XD Unspecified fracture of right forearm, subsequent encounter for closed fracture with routine healing: Secondary | ICD-10-CM

## 2014-07-25 DIAGNOSIS — C50911 Malignant neoplasm of unspecified site of right female breast: Secondary | ICD-10-CM

## 2014-07-25 DIAGNOSIS — D649 Anemia, unspecified: Secondary | ICD-10-CM

## 2014-07-25 DIAGNOSIS — E663 Overweight: Secondary | ICD-10-CM

## 2014-07-25 DIAGNOSIS — R7303 Prediabetes: Secondary | ICD-10-CM

## 2014-07-25 DIAGNOSIS — R7309 Other abnormal glucose: Secondary | ICD-10-CM

## 2014-07-25 DIAGNOSIS — F411 Generalized anxiety disorder: Secondary | ICD-10-CM | POA: Diagnosis not present

## 2014-07-25 MED ORDER — ALPRAZOLAM 0.25 MG PO TABS: 0.2500 mg | ORAL_TABLET | Freq: Two times a day (BID) | ORAL | Status: DC | PRN

## 2014-07-25 NOTE — Patient Instructions (Addendum)
Annual physical in 4.5 month, call if you need me before  CONGRATS in healthy lifestyle with daily exercise and continued weifght loss  You NEED to continue doing this , as you remain prediabetic  Ten tablets to help with anxiety  With  flying   It is important that you exercise regularly at least 30 minutes 7 times a week. If you develop chest pain, have severe difficulty breathing, or feel very tired, stop exercising immediately and seek medical attention   HBA1C, CBC, iron and ferritin in 4.5 month  Thanks for choosing [facility], we consider it a privelige to serve you.

## 2014-07-26 DIAGNOSIS — E663 Overweight: Secondary | ICD-10-CM | POA: Insufficient documentation

## 2014-07-26 NOTE — Assessment & Plan Note (Signed)
Deteriorated Patient educated about the importance of limiting  Carbohydrate intake , the need to commit to daily physical activity for a minimum of 30 minutes , and to commit weight loss. The fact that changes in all these areas will reduce or eliminate all together the development of diabetes is stressed.   Diabetic Labs Latest Ref Rng 07/19/2014 02/21/2014 11/13/2013 10/03/2013 09/13/2013  HbA1c <5.7 % 6.0(H) 5.9(H) - - 6.0(H)  Chol 0 - 200 mg/dL - 154 - - -  HDL >39 mg/dL - 64 - - -  Calc LDL 0 - 99 mg/dL - 78 - - -  Triglycerides <150 mg/dL - 61 - - -  Creatinine 0.50 - 1.10 mg/dL 0.71 0.77 0.75 0.8 -   BP/Weight 07/25/2014 02/28/2014 11/14/2013 11/13/2013 10/03/2013 09/20/2013 6/55/3748  Systolic BP 270 786 754 - 492 010 071  Diastolic BP 70 78 62 - 64 72 70  Wt. (Lbs) 167.12 172 179.45 - 167.8 167.12 167.12  BMI 27.81 28.62 - 29.86 27.92 27.81 27.81   No flowsheet data found.

## 2014-07-26 NOTE — Assessment & Plan Note (Signed)
Improved Patient re-educated about  the importance of commitment to a  minimum of 150 minutes of exercise per week.  The importance of healthy food choices with portion control discussed. Encouraged to start a food diary, count calories and to consider  joining a support group. Sample diet sheets offered. Goals set by the patient for the next several months.   Weight /BMI 07/25/2014 02/28/2014 11/14/2013  WEIGHT 167 lb 1.9 oz 172 lb 179 lb 7.3 oz  HEIGHT 5\' 5"  5\' 5"  -  BMI 27.81 kg/m2 28.62 kg/m2 -    Current exercise per week 90 minutes.

## 2014-07-26 NOTE — Assessment & Plan Note (Signed)
Safety in the home and fall risk reduction discussed

## 2014-07-26 NOTE — Progress Notes (Signed)
Gina Nelson     MRN: [id number]      DOB: 30-Dec-1950   HPI Gina Nelson is here for follow up and re-evaluation of chronic medical conditions, medication management and review of any available recent lab and radiology data.  Preventive health is updated, specifically  Cancer screening and Immunization.   Recently had one of her dogs die states she was advised about possibility  Of worm infestation, she is holding off on stool testing, states if she develops GI symptoms, perianal itch will call for test. The PT denies any adverse reactions to current medications since the last visit.  Requests xanax for use as she will soon be flying and she gets very anxious Has changed lifestyle, continues to lose weight , but blood sugar increased. Will increase anmt of exercise she does ROS Denies recent fever or chills. Denies sinus pressure, nasal congestion, ear pain or sore throat. Denies chest congestion, productive cough or wheezing. Denies chest pains, palpitations and leg swelling Denies abdominal pain, nausea, vomiting,diarrhea or constipation.   Denies dysuria, frequency, hesitancy or incontinence. Denies joint pain, swelling and limitation in mobility. Denies headaches, seizures, numbness, or tingling. Denies depression, anxiety or insomnia. Denies skin break down or rash.   PE  BP 110/70 mmHg  Pulse 71  Resp 16  Ht 5\' 5"  (1.651 m)  Wt 167 lb 1.9 oz (75.805 kg)  BMI 27.81 kg/m2  SpO2 99%  Patient alert and oriented and in no cardiopulmonary distress.  HEENT: No facial asymmetry, EOMI,   oropharynx pink and moist.  Neck supple no JVD, no mass.  Chest: Clear to auscultation bilaterally.  CVS: S1, S2 no murmurs, no S3.Regular rate.  ABD: Soft non tender.   Ext: No edema  MS: Adequate ROM spine, shoulders, hips and knees.  Skin: Intact, no ulcerations or rash noted.  Psych: Good eye contact, normal affect. Memory intact not anxious or depressed appearing.  CNS: CN  2-12 intact, power,  normal throughout.no focal deficits noted.   Assessment & Plan   Prediabetes Deteriorated Patient educated about the importance of limiting  Carbohydrate intake , the need to commit to daily physical activity for a minimum of 30 minutes , and to commit weight loss. The fact that changes in all these areas will reduce or eliminate all together the development of diabetes is stressed.   Diabetic Labs Latest Ref Rng 07/19/2014 02/21/2014 11/13/2013 10/03/2013 09/13/2013  HbA1c <5.7 % 6.0(H) 5.9(H) - - 6.0(H)  Chol 0 - 200 mg/dL - 154 - - -  HDL >39 mg/dL - 64 - - -  Calc LDL 0 - 99 mg/dL - 78 - - -  Triglycerides <150 mg/dL - 61 - - -  Creatinine 0.50 - 1.10 mg/dL 0.71 0.77 0.75 0.8 -   BP/Weight 07/25/2014 02/28/2014 11/14/2013 11/13/2013 10/03/2013 09/20/2013 3/78/5885  Systolic BP 027 741 287 - 867 672 094  Diastolic BP 70 78 62 - 64 72 70  Wt. (Lbs) 167.12 172 179.45 - 167.8 167.12 167.12  BMI 27.81 28.62 - 29.86 27.92 27.81 27.81   No flowsheet data found.     Lack of exercise Improved, stationary bike ride up to 3 times per week, will increase further target is 7 days per week  Anxiety state Limited amt of xanax prescribed in preparation for upcoming flight  Breast cancer-right T2N1. Followed by oncology, currently on tamoxifen  Closed right radial fracture Safety in the home and fall risk reduction discussed  Overweight (BMI 25.0-29.9) Improved  Patient re-educated about  the importance of commitment to a  minimum of 150 minutes of exercise per week.  The importance of healthy food choices with portion control discussed. Encouraged to start a food diary, count calories and to consider  joining a support group. Sample diet sheets offered. Goals set by the patient for the next several months.   Weight /BMI 07/25/2014 02/28/2014 11/14/2013  WEIGHT 167 lb 1.9 oz 172 lb 179 lb 7.3 oz  HEIGHT 5\' 5"  5\' 5"  -  BMI 27.81 kg/m2 28.62 kg/m2 -    Current exercise per  week 90 minutes.

## 2014-07-26 NOTE — Assessment & Plan Note (Signed)
Followed by oncology, currently on tamoxifen

## 2014-07-26 NOTE — Assessment & Plan Note (Signed)
Improved, stationary bike ride up to 3 times per week, will increase further target is 7 days per week

## 2014-07-26 NOTE — Assessment & Plan Note (Signed)
Limited amt of xanax prescribed in preparation for upcoming flight

## 2014-08-08 ENCOUNTER — Encounter: Payer: Self-pay | Admitting: *Deleted

## 2014-08-09 ENCOUNTER — Other Ambulatory Visit: Payer: Self-pay | Admitting: Oncology

## 2014-08-10 ENCOUNTER — Other Ambulatory Visit: Payer: Self-pay | Admitting: Oncology

## 2014-08-10 ENCOUNTER — Other Ambulatory Visit: Payer: Self-pay | Admitting: *Deleted

## 2014-08-10 ENCOUNTER — Telehealth: Payer: Self-pay | Admitting: Oncology

## 2014-08-10 NOTE — Telephone Encounter (Signed)
Confirmed appointment for Sept.

## 2014-09-11 ENCOUNTER — Telehealth: Payer: Self-pay | Admitting: *Deleted

## 2014-09-11 NOTE — Telephone Encounter (Signed)
Made appt for tomorrow

## 2014-09-11 NOTE — Telephone Encounter (Signed)
Pt called stating she has been feeling tender in her vaginal area, pt said there is no discharge and it is uncomfortable when she sits. Please advise [phone]

## 2014-09-12 ENCOUNTER — Encounter: Payer: Self-pay | Admitting: Family Medicine

## 2014-09-12 ENCOUNTER — Other Ambulatory Visit (HOSPITAL_COMMUNITY)
Admission: RE | Admit: 2014-09-12 | Discharge: 2014-09-12 | Disposition: A | Discharge status: Home / Self Care | Payer: BC Managed Care – PPO | Source: Ambulatory Visit | Attending: Family Medicine | Admitting: Family Medicine

## 2014-09-12 ENCOUNTER — Ambulatory Visit (INDEPENDENT_AMBULATORY_CARE_PROVIDER_SITE_OTHER): Payer: BC Managed Care – PPO | Admitting: Family Medicine

## 2014-09-12 VITALS — BP 104/60 | HR 80 | Resp 18 | Ht 65.0 in | Wt 167.0 lb

## 2014-09-12 DIAGNOSIS — R7309 Other abnormal glucose: Secondary | ICD-10-CM

## 2014-09-12 DIAGNOSIS — Z23 Encounter for immunization: Secondary | ICD-10-CM | POA: Diagnosis not present

## 2014-09-12 DIAGNOSIS — Z114 Encounter for screening for human immunodeficiency virus [HIV]: Secondary | ICD-10-CM

## 2014-09-12 DIAGNOSIS — E663 Overweight: Secondary | ICD-10-CM

## 2014-09-12 DIAGNOSIS — N760 Acute vaginitis: Secondary | ICD-10-CM

## 2014-09-12 DIAGNOSIS — Z1159 Encounter for screening for other viral diseases: Secondary | ICD-10-CM

## 2014-09-12 DIAGNOSIS — R7303 Prediabetes: Secondary | ICD-10-CM

## 2014-09-12 DIAGNOSIS — Z113 Encounter for screening for infections with a predominantly sexual mode of transmission: Secondary | ICD-10-CM | POA: Diagnosis present

## 2014-09-12 MED ORDER — FLUCONAZOLE 150 MG PO TABS: ORAL_TABLET | ORAL | Status: AC

## 2014-09-12 NOTE — Progress Notes (Signed)
   Subjective:    Patient ID: Gina Nelson, female    DOB: 04-21-1950, 64 y.o.   MRN: [id number]  HPI 3 day h/o vaginal burningf and itch, denies frequency or dysuria, uses replens, d/c is creamy   Review of Systems See HPI Denies recent fever or chills. Denies sinus pressure, nasal congestion, ear pain or sore throat. Denies chest congestion, productive cough or wheezing. Denies chest pains, palpitations and leg swelling Denies abdominal pain, nausea, vomiting,diarrhea or constipation.   Denies dysuria, frequency, hesitancy or incontinence. Denies skin break down or rash.        Objective:   Physical Exam BP 104/60 mmHg  Pulse 80  Resp 18  Ht 5\' 5"  (1.651 m)  Wt 167 lb 0.6 oz (75.769 kg)  BMI 27.80 kg/m2  SpO2 100% Patient alert and oriented and in no cardiopulmonary distress.  HEENT: No facial asymmetry, EOMI,   oropharynx pink and moist.  Neck supple no JVD, no mass.  Chest: Clear to auscultation bilaterally.  CVS: S1, S2 no murmurs, no S3.Regular rate.  ABD: Soft non tender.   Ext: No edema  MS: Adequate ROM spine, shoulders, hips and knees.  Skin: Intact, no ulcerations or rash noted.          Assessment & Plan:  Vaginitis and vulvovaginitis Symptomatic , specimens sent for testing will treat based on symptoms  Prediabetes Patient educated about the importance of limiting  Carbohydrate intake , the need to commit to daily physical activity for a minimum of 30 minutes , and to commit weight loss. The fact that changes in all these areas will reduce or eliminate all together the development of diabetes is stressed.   Diabetic Labs Latest Ref Rng 07/19/2014 02/21/2014 11/13/2013 10/03/2013 09/13/2013  HbA1c <5.7 % 6.0(H) 5.9(H) - - 6.0(H)  Chol 0 - 200 mg/dL - 154 - - -  HDL >39 mg/dL - 64 - - -  Calc LDL 0 - 99 mg/dL - 78 - - -  Triglycerides <150 mg/dL - 61 - - -  Creatinine 0.50 - 1.10 mg/dL 0.71 0.77 0.75 0.8 -   BP/Weight 09/12/2014 07/25/2014  02/28/2014 11/14/2013 11/13/2013 5/78/4696 02/21/5282  Systolic BP 132 440 102 725 - 366 440  Diastolic BP 60 70 78 62 - 64 72  Wt. (Lbs) 167.04 167.12 172 179.45 - 167.8 167.12  BMI 27.8 27.81 28.62 - 29.86 27.92 27.81   No flowsheet data found.     Overweight (BMI 25.0-29.9) Unchnaged Patient re-educated about  the importance of commitment to a  minimum of 150 minutes of exercise per week.  The importance of healthy food choices with portion control discussed. Encouraged to start a food diary, count calories and to consider  joining a support group. Sample diet sheets offered. Goals set by the patient for the next several months.   Weight /BMI 09/12/2014 07/25/2014 02/28/2014  WEIGHT 167 lb 0.6 oz 167 lb 1.9 oz 172 lb  HEIGHT 5\' 5"  5\' 5"  5\' 5"   BMI 27.8 kg/m2 27.81 kg/m2 28.62 kg/m2    Current exercise per week 100 minutes.   Need for prophylactic vaccination and inoculation against influenza After obtaining informed consent, the vaccine is  administered by LPN.

## 2014-09-12 NOTE — Patient Instructions (Addendum)
Annual physical as before, call if you need me sooner please  Urine is sent for testing , and you will be contacted  With the result  Keep up your exercise routine please, great for your health  Flu vaccine today  HBa1C, hep C and HIV screen for December visit please  Thanks for choosing [facility], we consider it a privelige to serve you. Please work on good  health habits so that your health will improve. 1. Commitment to daily physical activity for 30 to 60  minutes, if you are able to do this.  2. Commitment to wise food choices. Aim for half of your  food intake to be vegetable and fruit, one quarter starchy foods, and one quarter protein. Try to eat on a regular schedule  3 meals per day, snacking between meals should be limited to vegetables or fruits or small portions of nuts. 64 ounces of water per day is generally recommended, unless you have specific health conditions, like heart failure or kidney failure where you will need to limit fluid intake.  3. Commitment to sufficient and a  good quality of physical and mental rest daily, generally between 6 to 8 hours per day.  WITH PERSISTANCE AND PERSEVERANCE, THE IMPOSSIBLE , BECOMES THE NORM!

## 2014-09-14 ENCOUNTER — Telehealth: Payer: Self-pay | Admitting: Family Medicine

## 2014-09-14 LAB — URINE CYTOLOGY ANCILLARY ONLY: TRICH (WINDOWPATH): NEGATIVE

## 2014-09-14 NOTE — Telephone Encounter (Addendum)
Patient states that she has taken the Diflucan and she is still having some outside discomfort, she has tried [employer] brand and she states that burned her real bad, can you recommend anything else, patient states that she is going out of town later today and wont be back until [employer] advise?

## 2014-09-14 NOTE — Telephone Encounter (Signed)
I recommend no other medication until results are available. OK to use something non irritating like pure vaseline as a moisturizer

## 2014-09-14 NOTE — Telephone Encounter (Signed)
Please advise.  Results are not available yet.

## 2014-09-14 NOTE — Telephone Encounter (Signed)
Patient aware.

## 2014-09-17 LAB — URINE CYTOLOGY ANCILLARY ONLY
BACTERIAL VAGINITIS: NEGATIVE
Candida vaginitis: NEGATIVE

## 2014-09-24 DIAGNOSIS — N760 Acute vaginitis: Secondary | ICD-10-CM | POA: Insufficient documentation

## 2014-09-24 DIAGNOSIS — Z23 Encounter for immunization: Secondary | ICD-10-CM | POA: Insufficient documentation

## 2014-09-24 NOTE — Assessment & Plan Note (Signed)
Unchnaged Patient re-educated about  the importance of commitment to a  minimum of 150 minutes of exercise per week.  The importance of healthy food choices with portion control discussed. Encouraged to start a food diary, count calories and to consider  joining a support group. Sample diet sheets offered. Goals set by the patient for the next several months.   Weight /BMI 09/12/2014 07/25/2014 02/28/2014  WEIGHT 167 lb 0.6 oz 167 lb 1.9 oz 172 lb  HEIGHT 5\' 5"  5\' 5"  5\' 5"   BMI 27.8 kg/m2 27.81 kg/m2 28.62 kg/m2    Current exercise per week 100 minutes.

## 2014-09-24 NOTE — Assessment & Plan Note (Signed)
After obtaining informed consent, the vaccine is  administered by LPN.  

## 2014-09-24 NOTE — Assessment & Plan Note (Signed)
Patient educated about the importance of limiting  Carbohydrate intake , the need to commit to daily physical activity for a minimum of 30 minutes , and to commit weight loss. The fact that changes in all these areas will reduce or eliminate all together the development of diabetes is stressed.   Diabetic Labs Latest Ref Rng 07/19/2014 02/21/2014 11/13/2013 10/03/2013 09/13/2013  HbA1c <5.7 % 6.0(H) 5.9(H) - - 6.0(H)  Chol 0 - 200 mg/dL - 154 - - -  HDL >39 mg/dL - 64 - - -  Calc LDL 0 - 99 mg/dL - 78 - - -  Triglycerides <150 mg/dL - 61 - - -  Creatinine 0.50 - 1.10 mg/dL 0.71 0.77 0.75 0.8 -   BP/Weight 09/12/2014 07/25/2014 02/28/2014 11/14/2013 11/13/2013 6/31/4970 02/19/3783  Systolic BP 885 027 741 287 - 867 672  Diastolic BP 60 70 78 62 - 64 72  Wt. (Lbs) 167.04 167.12 172 179.45 - 167.8 167.12  BMI 27.8 27.81 28.62 - 29.86 27.92 27.81   No flowsheet data found.

## 2014-09-24 NOTE — Assessment & Plan Note (Signed)
Symptomatic , specimens sent for testing will treat based on symptoms

## 2014-10-10 ENCOUNTER — Other Ambulatory Visit: Payer: Self-pay

## 2014-10-10 DIAGNOSIS — C50911 Malignant neoplasm of unspecified site of right female breast: Secondary | ICD-10-CM

## 2014-10-11 ENCOUNTER — Encounter: Payer: Self-pay | Admitting: Nurse Practitioner

## 2014-10-11 ENCOUNTER — Ambulatory Visit (HOSPITAL_BASED_OUTPATIENT_CLINIC_OR_DEPARTMENT_OTHER): Payer: BC Managed Care – PPO | Admitting: Nurse Practitioner

## 2014-10-11 ENCOUNTER — Telehealth: Payer: Self-pay | Admitting: Nurse Practitioner

## 2014-10-11 ENCOUNTER — Other Ambulatory Visit: Payer: BC Managed Care – PPO

## 2014-10-11 ENCOUNTER — Other Ambulatory Visit (HOSPITAL_BASED_OUTPATIENT_CLINIC_OR_DEPARTMENT_OTHER): Payer: BC Managed Care – PPO

## 2014-10-11 VITALS — BP 130/45 | HR 95 | Temp 98.4°F | Resp 18 | Ht 65.0 in | Wt 165.0 lb

## 2014-10-11 DIAGNOSIS — C773 Secondary and unspecified malignant neoplasm of axilla and upper limb lymph nodes: Secondary | ICD-10-CM | POA: Diagnosis not present

## 2014-10-11 DIAGNOSIS — C50911 Malignant neoplasm of unspecified site of right female breast: Secondary | ICD-10-CM

## 2014-10-11 DIAGNOSIS — C50511 Malignant neoplasm of lower-outer quadrant of right female breast: Secondary | ICD-10-CM

## 2014-10-11 LAB — CBC WITH DIFFERENTIAL/PLATELET
BASO%: 0.4 % (ref 0.0–2.0)
BASOS ABS: 0 10*3/uL (ref 0.0–0.1)
EOS ABS: 0.2 10*3/uL (ref 0.0–0.5)
EOS%: 3.1 % (ref 0.0–7.0)
HEMATOCRIT: 34.6 % — AB (ref 34.8–46.6)
HGB: 11 g/dL — ABNORMAL LOW (ref 11.6–15.9)
LYMPH#: 1.2 10*3/uL (ref 0.9–3.3)
LYMPH%: 17.4 % (ref 14.0–49.7)
MCH: 26.5 pg (ref 25.1–34.0)
MCHC: 31.9 g/dL (ref 31.5–36.0)
MCV: 83 fL (ref 79.5–101.0)
MONO#: 0.6 10*3/uL (ref 0.1–0.9)
MONO%: 8.4 % (ref 0.0–14.0)
NEUT#: 5 10*3/uL (ref 1.5–6.5)
NEUT%: 70.7 % (ref 38.4–76.8)
PLATELETS: 184 10*3/uL (ref 145–400)
RBC: 4.17 10*6/uL (ref 3.70–5.45)
RDW: 15.6 % — ABNORMAL HIGH (ref 11.2–14.5)
WBC: 7 10*3/uL (ref 3.9–10.3)

## 2014-10-11 LAB — COMPREHENSIVE METABOLIC PANEL (CC13)
ALT: <9 U/L (ref 0–55)
ANION GAP: 6 meq/L (ref 3–11)
AST: 17 U/L (ref 5–34)
Albumin: 3.5 g/dL (ref 3.5–5.0)
Alkaline Phosphatase: 96 U/L (ref 40–150)
BUN: 8.6 mg/dL (ref 7.0–26.0)
CALCIUM: 9.1 mg/dL (ref 8.4–10.4)
CHLORIDE: 110 meq/L — AB (ref 98–109)
CO2: 28 meq/L (ref 22–29)
CREATININE: 0.8 mg/dL (ref 0.6–1.1)
EGFR: 86 mL/min/{1.73_m2} — AB (ref >90)
Glucose: 111 mg/dl (ref 70–140)
Potassium: 3.4 mEq/L — ABNORMAL LOW (ref 3.5–5.1)
Sodium: 143 mEq/L (ref 136–145)
Total Bilirubin: 0.57 mg/dL (ref 0.20–1.20)
Total Protein: 6.3 g/dL — ABNORMAL LOW (ref 6.4–8.3)

## 2014-10-11 NOTE — Telephone Encounter (Signed)
Gave avs & calendar for September 2017 °

## 2014-10-11 NOTE — Progress Notes (Signed)
ID: Gina Nelson   DOB: 1951-01-07  MR#: [id number]  RJJ#:[id number]  PCP: Tula Nakayama, MD GYN: SU: [name] OTHER MD:  CHIEF COMPLAINT: right breast cancer  CURRENT TREATMENT: tamoxifen daily  BREAST CANCER HISTORY: The patient herself noted a mass in her right breast.  She brought it to [name] attention and worked her up for diagnostic mammography and ultrasonography December 29.  [name] was able to palpate a discrete mass in the right breast associated with skin thickening.  By mammography, there was an obscured mass associated with distortion in that area by ultrasound.  The mass was irregular and hypoechoic measuring 1.9 cm.  In the right axilla, there was an enlarged lymph node measuring 2.1 cm.  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  This appeared to be low grade and strongly ER positive at 100%, PR was 34%, the MIB-1 was 41% and there was no evidence of HER2/neu amplification by CISH with a ratio of 1.58.  With this information, the patient was referred to [name] and bilateral breast MRIs were obtained on January 11.  The MRI found a 4.9 cm lobulated, irregular mass with spiculated margins in the right breast at the 7 o'clock location with some skin and trabecular enhancement. There was linear enhancement extending to the nipple, but no apparent extension to the pectoralis muscle.  There were two lymph nodes noted in the right axilla, one measuring 8 mm, the other 6 mm.  There was no internal mammary adenopathy noted and the left breast was unremarkable.  Her subsequent history is as detailed below  INTERVAL HISTORY: Gina Nelson returns today for follow up of her breast cancer. She has been on tamoxifen since February 2013. She tolerates this well, with the  exception of intermittent hot flashes. She manages these on her own. She has vaginal dryness, but this is a menopausal symptom. The interval history is generally unremarkable. She rides a recumbent bike for exercise.   REVIEW OF SYSTEMS: Gina Nelson denies fevers, chills, nausea, or vomiting. She takes bisacodyl PRN for constipation. She is eating and drinking well, making healthy choices. She has arthritic pains for which she uses tylenol as needed. She denies shortness of breath, chest pain, cough, or palpitations. A detailed review of systems is otherwise stable.  PAST MEDICAL HISTORY: Past Medical History  Diagnosis Date  . Arthritis     knee  . Blood transfusion   . Cancer     right breast T1cN2  . Allergy   . PONV (postoperative nausea and vomiting)     PAST SURGICAL HISTORY: Past Surgical History  Procedure Laterality Date  . Total knee arthroplasty  2011  . Abdominal hysterectomy  1984  . Port-a-cath removal  07/16/10  . Colonoscopy  05/07/2011    Procedure: COLONOSCOPY;  Surgeon: Rogene Houston, MD;  Location: [facility];  Service: Endoscopy;  Laterality: N/A;  830  . Breast surgery  right MRM, left simple mastectomy  . Breast surgery  left mastectomy    FAMILY HISTORY Family History  Problem Relation Age of Onset  . Cancer Father     prostate  The patient's father had prostate cancer metastatic to bone. He died at the age of 40. There is no other history of cancer in the family to the patient's  knowledge.  GYNECOLOGIC HISTORY: Menarche age 71. She stopped having periods of course with her hysterectomy in 1984. She is Gx, P1. She was 18 at the time of that delivery. She used hormone replacement for about two and a half years.  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 Her husband, Gina Nelson, is a retired Airline pilot and currently a Curator. Daughter, Gina Nelson, keeps a home for  handicapped girls. Her husband, Gina Nelson, works in the Therapist, sports business. The patient has one granddaughter who is in her late teens.    ADVANCED DIRECTIVES:  HEALTH MAINTENANCE: Social History  Substance Use Topics  . Smoking status: Never Smoker   . Smokeless tobacco: Never Used  . Alcohol Use: No   She had a colonoscopy April 2013 which showed only a small tubular adenoma. She had a bone density September 2012 which was normal.   Allergies  Allergen Reactions  . Codeine Itching    Arms and face    Current Outpatient Prescriptions  Medication Sig Dispense Refill  . ALPRAZolam (XANAX) 0.25 MG tablet Take 1 tablet (0.25 mg total) by mouth 2 (two) times daily as needed for anxiety. 10 tablet 0  . aspirin 81 MG tablet Take 81 mg by mouth daily.    . Bisacodyl (LAXATIVE PO) Take 1-2 tablets by mouth at bedtime.     . Multiple Vitamins-Minerals (MULTI-VITAMIN GUMMIES) CHEW Chew 2 tablets by mouth daily.    . tamoxifen (NOLVADEX) 20 MG tablet TAKE ONE TABLET BY MOUTH ONCE DAILY 90 tablet 0  . acetaminophen (TYLENOL) 500 MG tablet Take 1,000 mg by mouth every 6 (six) hours as needed for headache.     No current facility-administered medications for this visit.    OBJECTIVE: Middle-aged [place] American woman who appears well Filed Vitals:   10/11/14 0930  BP: 130/45  Pulse: 95  Temp: 98.4 F (36.9 C)  Resp: 18     Body mass index is 27.46 kg/(m^2).    ECOG FS: 0  Sclerae unicteric, EOMs intact Oropharynx clear and moist No cervical or supraclavicular adenopathy Lungs no rales or rhonchi Heart regular rate and rhythm Abd soft, nontender, positive bowel sounds MSK no focal spinal tenderness, no upper extremity edema Neuro: nonfocal, well oriented, pleasant affect Breasts: Status post bilateral mastectomies. No evidence of chest wall recurrence. Both axillae are benign.  Skin: warm, dry  HEENT: sclerae anicteric, conjunctivae pink, oropharynx clear. No thrush or mucositis.   Lymph Nodes: No cervical or supraclavicular lymphadenopathy  Lungs: clear to auscultation bilaterally, no rales, wheezes, or rhonci  Heart: regular rate and rhythm  Abdomen: round, soft, non tender, positive bowel sounds  Musculoskeletal: No focal spinal tenderness, no peripheral edema  Neuro: non focal, well oriented, positive affect  Breasts: bilateral breasts status post mastectomies with no reconstruction. No evidence of recurrent disease. Bilateral axilla benign.   LAB RESULTS: Lab Results  Component Value Date   WBC 7.0 10/11/2014   NEUTROABS 5.0 10/11/2014   HGB 11.0* 10/11/2014   HCT 34.6* 10/11/2014   MCV 83.0 10/11/2014   PLT 184 10/11/2014      Chemistry      Component Value Date/Time   NA 143 10/11/2014 0903   NA 145 07/19/2014 0827   K 3.4* 10/11/2014 0903   K 3.7 07/19/2014 0827   CL 109 07/19/2014 0827   CL 109* 11/27/2011 1454   CO2 28 10/11/2014 0903   CO2  25 07/19/2014 0827   BUN 8.6 10/11/2014 0903   BUN 8 07/19/2014 0827   CREATININE 0.8 10/11/2014 0903   CREATININE 0.71 07/19/2014 0827   CREATININE 0.75 11/13/2013 0803      Component Value Date/Time   CALCIUM 9.1 10/11/2014 0903   CALCIUM 8.8 07/19/2014 0827   ALKPHOS 96 10/11/2014 0903   ALKPHOS 102 02/21/2014 1143   AST 17 10/11/2014 0903   AST 18 02/21/2014 1143   ALT <9 10/11/2014 0903   ALT 9 02/21/2014 1143   BILITOT 0.57 10/11/2014 0903   BILITOT 0.5 02/21/2014 1143       Lab Results  Component Value Date   LABCA2 25 11/27/2011    No components found for: [id number]  No results for input(s): INR in the last 168 hours.  Urinalysis    Component Value Date/Time   COLORURINE YELLOW 11/13/2013 1430   APPEARANCEUR CLEAR 11/13/2013 1430   LABSPEC 1.010 11/13/2013 1430   PHURINE 6.5 11/13/2013 1430   GLUCOSEU NEGATIVE 11/13/2013 1430   HGBUR NEGATIVE 11/13/2013 1430   HGBUR negative 02/21/2009 1509   BILIRUBINUR NEGATIVE 11/13/2013 1430   BILIRUBINUR small 09/01/2013 1147    KETONESUR NEGATIVE 11/13/2013 1430   PROTEINUR NEGATIVE 11/13/2013 1430   PROTEINUR 100 09/01/2013 1147   UROBILINOGEN 0.2 11/13/2013 1430   UROBILINOGEN 0.2 09/01/2013 1147   NITRITE NEGATIVE 11/13/2013 1430   NITRITE negative 09/01/2013 1147   LEUKOCYTESUR NEGATIVE 11/13/2013 1430    STUDIES: No results found.  ASSESSMENT: 64 y.o.  [place] woman status post right breast and axillary lymph node biopsy January 2012, both positive for an invasive ductal carcinoma, grade 3, measuring 4.9 cm by MRI, and so stage IIB/IIIA clinically. The tumor was ER positive, PR positive, HER2 negative, with an elevated MIB-1.   (1) She received neoadjuvant docetaxel/ cyclophosphamide x4 followed by doxorubicin/ cyclophosphamide x1, at which point neoadjuvant treatment was discontinued because of poor tolerance  (2) she underwent right modified radical mastectomy with left prophylactic mastectomy June 2012. The left side was benign. On the right, she had 2 areas of residual tumor, one measuring 1.1 cm, the other less than a centimeter, both grade 3, involving 4 out of 13 lymph nodes sampled. Repeat HER2 was again negative, and repeat estrogen and progesterone receptors were again positive at 94% and 83%, respectively.   (3) adjuvant radiation treatments completed September of 2012, at which time she started letrozole, discontinued because of arthralgias/myalgias  (4) tamoxifen started February 2013, to be continued for a total of 10 years   PLAN:  Versie is doing well as far as her breast cancer is concerned. She is now 4.5 years out from her definitive surgery with no evidence of recurrent disease. The labs were reviewed in detail and were entirely stable. She is tolerating the tamoxifen well and is expected to complete 10 years of antiestrogen therapy on this drug.   Samiah will return in 1 year for labs and a follow up visit. She understands and agrees with this plan. She knows the goal of treatment in  her case is cure. She has been encouraged to call with any issues that might arise before her next visit here.   Gina Nelson    10/11/2014

## 2014-10-13 ENCOUNTER — Telehealth: Payer: Self-pay

## 2014-10-13 DIAGNOSIS — N3000 Acute cystitis without hematuria: Secondary | ICD-10-CM

## 2014-10-13 MED ORDER — CIPROFLOXACIN HCL 500 MG PO TABS: 500.0000 mg | ORAL_TABLET | Freq: Two times a day (BID) | ORAL | Status: DC

## 2014-10-13 MED ORDER — FLUCONAZOLE 150 MG PO TABS: 150.0000 mg | ORAL_TABLET | Freq: Once | ORAL | Status: DC

## 2014-10-13 NOTE — Telephone Encounter (Signed)
ptient aware and will come submit specimen

## 2014-10-13 NOTE — Telephone Encounter (Signed)
Since  Very symptomatic, have her submit C/S  pls, once submitted , she may fill 3 day course of cipro which I am asking you to send in, explain and let her knoiw.  She had her first UTI earlier this year and was very stressed out about it , so I will send in med based on symptoms  This time

## 2014-10-13 NOTE — Addendum Note (Signed)
Addended by: Eual Fines on: 10/13/2014 09:21 AM   Modules accepted: Orders

## 2014-10-13 NOTE — Telephone Encounter (Signed)
X 2 days at the end of her urine stream she has been feeling a lot of pressure. Also has been urinating more frequently. Wants to know if she can submit some urine at the lab for testing? Please advise

## 2014-10-16 LAB — URINE CULTURE

## 2014-11-20 ENCOUNTER — Telehealth: Payer: Self-pay | Admitting: Family Medicine

## 2014-11-20 DIAGNOSIS — N760 Acute vaginitis: Secondary | ICD-10-CM

## 2014-11-20 NOTE — Telephone Encounter (Signed)
Please advise.  Should patient be referred to gyn.

## 2014-11-20 NOTE — Telephone Encounter (Signed)
Patient referred to Dr. Glo Herring

## 2014-11-20 NOTE — Addendum Note (Signed)
Addended by: Denman George B on: 11/20/2014 02:51 PM   Modules accepted: Orders

## 2014-11-20 NOTE — Telephone Encounter (Signed)
Has recurrent complaionts of vaginal symptoms, has ahd breast cancer, yes I recommend gyne eval, referif she agrees to Dr Glo Herring, or any Provuider of her choice , i will sign, dx recurrent vaginitis, pls

## 2014-11-20 NOTE — Addendum Note (Signed)
Addended by: Denman George B on: 11/20/2014 02:37 PM   Modules accepted: Orders

## 2014-11-20 NOTE — Telephone Encounter (Addendum)
Patient is calling stating that she is having vaginal tenderness and is asking for some cream, please advise at [phone] ?

## 2014-11-23 ENCOUNTER — Ambulatory Visit (INDEPENDENT_AMBULATORY_CARE_PROVIDER_SITE_OTHER): Payer: BC Managed Care – PPO | Admitting: Obstetrics and Gynecology

## 2014-11-23 ENCOUNTER — Encounter: Payer: Self-pay | Admitting: Obstetrics and Gynecology

## 2014-11-23 VITALS — BP 120/70 | Ht 65.0 in | Wt 165.0 lb

## 2014-11-23 DIAGNOSIS — N952 Postmenopausal atrophic vaginitis: Secondary | ICD-10-CM | POA: Insufficient documentation

## 2014-11-23 MED ORDER — HYDROCORTISONE ACE-PRAMOXINE 1-1 % RE CREA: 1.0000 "application " | TOPICAL_CREAM | Freq: Two times a day (BID) | RECTAL | Status: DC

## 2014-11-23 NOTE — Progress Notes (Signed)
Patient ID: Gina Nelson, female   DOB: Jun 25, 1950, 63 y.o.   MRN: [id number] Pt here today for recurrent vaginal irritation. Pt states that she has vaginal itching on the outside abut denies any discharge.

## 2014-11-23 NOTE — Progress Notes (Signed)
[facility] Visit  Patient name: Gina Nelson MRN [id number]  Date of birth: [birth year]  CC & HPI:  Gina Nelson is a 64 y.o. female presenting today for recurrent vaginal irritation onset 2 months. She reports associated vaginal dryness. Pt states that she has vaginal itching on the outside abut denies any discharge. She is currently on tamoxifen 20mg  and has been taking for 4 years. Pt has a h/o right sided breast cancer and had a double mastectomy. She had chemo and radiation therapy to the right breast and she had 9-14 lymph nodes removed which tested positive for cancer cells. She states she is currently sexually active and the vaginal dryness is causing her to have dyspareunia;1-2 days after intercourse she experiences tenderness to vaginal entrance. Pt is a new pt that was referred by Dr. [name].   ROS:  10 Systems reviewed and all are negative for acute change except as noted in the HPI.    Pertinent History Reviewed:   Reviewed: Significant for breast cancer Medical         Past Medical History  Diagnosis Date  . Arthritis     knee  . Blood transfusion   . Cancer [facility])     right breast T1cN2  . Allergy   . PONV (postoperative nausea and vomiting)                               Surgical Hx:    Past Surgical History  Procedure Laterality Date  . Total knee arthroplasty  2011  . Abdominal hysterectomy  1984  . Port-a-cath removal  07/16/10  . Colonoscopy  05/07/2011    Procedure: COLONOSCOPY;  Surgeon: Rogene Houston, MD;  Location: [facility];  Service: Endoscopy;  Laterality: N/A;  830  . Breast surgery  right MRM, left simple mastectomy  . Breast surgery  left mastectomy   Medications: Reviewed & Updated - see associated section                       Current outpatient prescriptions:  .  acetaminophen (TYLENOL) 500 MG tablet, Take 1,000 mg by mouth every 6 (six) hours as needed for headache., Disp: , Rfl:  .  aspirin 81 MG tablet, Take 81 mg by  mouth daily., Disp: , Rfl:  .  Bisacodyl (LAXATIVE PO), Take 1-2 tablets by mouth at bedtime. , Disp: , Rfl:  .  Multiple Vitamins-Minerals (MULTI-VITAMIN GUMMIES) CHEW, Chew 2 tablets by mouth daily., Disp: , Rfl:  .  tamoxifen (NOLVADEX) 20 MG tablet, TAKE ONE TABLET BY MOUTH ONCE DAILY, Disp: 90 tablet, Rfl: 0 .  ALPRAZolam (XANAX) 0.25 MG tablet, Take 1 tablet (0.25 mg total) by mouth 2 (two) times daily as needed for anxiety. (Patient not taking: Reported on 11/23/2014), Disp: 10 tablet, Rfl: 0   Social History: Reviewed -  reports that she has never smoked. She has never used smokeless tobacco.  Objective Findings:  Vitals: Blood pressure 120/70, height 5\' 5"  (1.651 m), weight 165 lb (74.844 kg).  Physical Examination: General appearance - alert, well appearing, and in no distress Mental status - alert, oriented to person, place, and time Pelvic - generally healthy vaginal tissues but atrophic. S/p hysterectomy with removal of cervix. Good vaginal support. No masses palpable. No skin abnormalities. Neurological - alert, oriented, normal speech, no focal findings or movement disorder noted Musculoskeletal - no joint  tenderness, deformity or swelling Extremities - peripheral pulses normal, no pedal edema, no clubbing or cyanosis Skin - normal coloration and turgor, no rashes, no suspicious skin lesions noted   Assessment & Plan:   A:  1. 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 Will give rx for Pramoxine to use 2x daily as needed 2. Recommended [employer] for lubrication   By signing my name below, I, Erling Conte, attest that this documentation has been prepared under the direction and in the presence of [name], MD. Electronically Signed: Erling Conte, ED Scribe. 11/23/2014. 3:07 PM.  I personally performed the services described in this documentation, which was SCRIBED in my presence. The recorded information has  been reviewed and considered accurate. It has been edited as necessary during review. [name], MD

## 2014-12-15 LAB — HEMOGLOBIN A1C
Hgb A1c MFr Bld: 5.9 % — ABNORMAL HIGH (ref <5.7)
MEAN PLASMA GLUCOSE: 123 mg/dL — AB (ref <117)

## 2014-12-16 LAB — HEPATITIS C ANTIBODY: HCV Ab: NEGATIVE

## 2014-12-18 LAB — HIV ANTIBODY (ROUTINE TESTING W REFLEX): HIV 1&2 Ab, 4th Generation: NONREACTIVE

## 2014-12-20 ENCOUNTER — Encounter: Payer: Self-pay | Admitting: Family Medicine

## 2014-12-20 ENCOUNTER — Ambulatory Visit (INDEPENDENT_AMBULATORY_CARE_PROVIDER_SITE_OTHER): Payer: BC Managed Care – PPO | Admitting: Family Medicine

## 2014-12-20 VITALS — BP 102/60 | HR 80 | Resp 18 | Ht 65.0 in | Wt 167.0 lb

## 2014-12-20 DIAGNOSIS — R7303 Prediabetes: Secondary | ICD-10-CM | POA: Diagnosis not present

## 2014-12-20 DIAGNOSIS — E663 Overweight: Secondary | ICD-10-CM | POA: Diagnosis not present

## 2014-12-20 NOTE — Patient Instructions (Addendum)
Welcome to medicare in  4 month, call if you need me before  Improved labs , congrats  Fasting lipid, chem 7 , TSH, CBC, HBA1C, vit D in 4 month  Keep up great health habits  Please recommit to daily exercise on your bike!  All the best for 2017!

## 2014-12-20 NOTE — Assessment & Plan Note (Signed)
Improved Patient educated about the importance of limiting  Carbohydrate intake , the need to commit to daily physical activity for a minimum of 30 minutes , and to commit weight loss. The fact that changes in all these areas will reduce or eliminate all together the development of diabetes is stressed.   Diabetic Labs Latest Ref Rng 12/15/2014 10/11/2014 07/19/2014 02/21/2014 11/13/2013  HbA1c <5.7 % 5.9(H) - 6.0(H) 5.9(H) -  Chol 0 - 200 mg/dL - - - 154 -  HDL >39 mg/dL - - - 64 -  Calc LDL 0 - 99 mg/dL - - - 78 -  Triglycerides <150 mg/dL - - - 61 -  Creatinine 0.6 - 1.1 mg/dL - 0.8 0.71 0.77 0.75   BP/Weight 12/20/2014 11/23/2014 10/11/2014 09/12/2014 07/25/2014 02/28/2014 0000000  Systolic BP A999333 [id number] 123456 A999333 [id number] 0000000  Diastolic BP 60 70 45 60 70 78 62  Wt. (Lbs) 167 165 165 167.04 167.12 172 179.45  BMI 27.79 27.46 27.46 27.8 27.81 28.62 -   No flowsheet data found.

## 2014-12-20 NOTE — Progress Notes (Signed)
Subjective:    Patient ID: Gina Nelson, female    DOB: 12-05-1950, 64 y.o.   MRN: [id number]  HPI   Gina Nelson     MRN: [id number]      DOB: March 10, 1950   HPI Gina Nelson is here for follow up and re-evaluation of chronic medical conditions, medication management and review of any available recent lab and radiology data.  Preventive health is updated, specifically  Cancer screening and Immunization.   Questions or concerns regarding consultations or procedures which the PT has had in the interim are  addressed. The PT denies any adverse reactions to current medications since the last visit.  There are no new concerns.  There are no specific complaints   ROS Denies recent fever or chills. Denies sinus pressure, nasal congestion, ear pain or sore throat. Denies chest congestion, productive cough or wheezing. Denies chest pains, palpitations and leg swelling Denies abdominal pain, nausea, vomiting,diarrhea or constipation.   Denies dysuria, frequency, hesitancy or incontinence. Denies joint pain, swelling and limitation in mobility. Denies headaches, seizures, numbness, or tingling. Denies depression, anxiety or insomnia. Denies skin break down or rash.   PE  BP 102/60 mmHg  Pulse 80  Resp 18  Ht 5\' 5"  (1.651 m)  Wt 167 lb (75.751 kg)  BMI 27.79 kg/m2  SpO2 98%  Patient alert and oriented and in no cardiopulmonary distress.  HEENT: No facial asymmetry, EOMI,   oropharynx pink and moist.  Neck supple no JVD, no mass.  Chest: Clear to auscultation bilaterally.  CVS: S1, S2 no murmurs, no S3.Regular rate.  ABD: Soft non tender.   Ext: No edema  MS: Adequate ROM spine, shoulders, hips and knees.  Skin: Intact, no ulcerations or rash noted.  Psych: Good eye contact, normal affect. Memory intact not anxious or depressed appearing.  CNS: CN 2-12 intact, power,  normal throughout.no focal deficits noted.   Assessment &  Plan   Prediabetes Improved Patient educated about the importance of limiting  Carbohydrate intake , the need to commit to daily physical activity for a minimum of 30 minutes , and to commit weight loss. The fact that changes in all these areas will reduce or eliminate all together the development of diabetes is stressed.   Diabetic Labs Latest Ref Rng 12/15/2014 10/11/2014 07/19/2014 02/21/2014 11/13/2013  HbA1c <5.7 % 5.9(H) - 6.0(H) 5.9(H) -  Chol 0 - 200 mg/dL - - - 154 -  HDL >39 mg/dL - - - 64 -  Calc LDL 0 - 99 mg/dL - - - 78 -  Triglycerides <150 mg/dL - - - 61 -  Creatinine 0.6 - 1.1 mg/dL - 0.8 0.71 0.77 0.75   BP/Weight 12/20/2014 11/23/2014 10/11/2014 09/12/2014 07/25/2014 02/28/2014 0000000  Systolic BP A999333 123456 [id number] 123456 A999333 A999333 0000000  Diastolic BP 60 70 45 60 70 78 62  Wt. (Lbs) 167 165 165 167.04 167.12 172 179.45  BMI 27.79 27.46 27.46 27.8 27.81 28.62 -   No flowsheet data found.     Overweight (BMI 25.0-29.9) Deteriorated. Patient re-educated about  the importance of commitment to a  minimum of 150 minutes of exercise per week.  The importance of healthy food choices with portion control discussed. Encouraged to start a food diary, count calories and to consider  joining a support group. Sample diet sheets offered. Goals set by the patient for the next several months.   Weight /BMI 12/20/2014 11/23/2014 10/11/2014  WEIGHT 167 lb 165 lb 165 lb  HEIGHT 5\' 5"  5\' 5"  5\' 5"   BMI 27.79 kg/m2 27.46 kg/m2 27.46 kg/m2    Current exercise per week 60 minutes.        Review of Systems     Objective:   Physical Exam        Assessment & Plan:

## 2014-12-21 ENCOUNTER — Ambulatory Visit: Payer: BC Managed Care – PPO | Admitting: Obstetrics and Gynecology

## 2015-01-15 NOTE — Assessment & Plan Note (Signed)
Deteriorated. Patient re-educated about  the importance of commitment to a  minimum of 150 minutes of exercise per week.  The importance of healthy food choices with portion control discussed. Encouraged to start a food diary, count calories and to consider  joining a support group. Sample diet sheets offered. Goals set by the patient for the next several months.   Weight /BMI 12/20/2014 11/23/2014 10/11/2014  WEIGHT 167 lb 165 lb 165 lb  HEIGHT 5\' 5"  5\' 5"  5\' 5"   BMI 27.79 kg/m2 27.46 kg/m2 27.46 kg/m2    Current exercise per week 60 minutes.

## 2015-02-15 ENCOUNTER — Other Ambulatory Visit: Payer: Self-pay | Admitting: Oncology

## 2015-04-19 ENCOUNTER — Other Ambulatory Visit: Payer: Self-pay | Admitting: Family Medicine

## 2015-04-19 LAB — CBC
HEMATOCRIT: 36.4 % (ref 35.0–45.0)
HEMOGLOBIN: 11.1 g/dL — AB (ref 11.7–15.5)
MCH: 25.7 pg — ABNORMAL LOW (ref 27.0–33.0)
MCHC: 30.5 g/dL — AB (ref 32.0–36.0)
MCV: 84.3 fL (ref 80.0–100.0)
MPV: 11.7 fL (ref 7.5–12.5)
Platelets: 194 10*3/uL (ref 140–400)
RBC: 4.32 MIL/uL (ref 3.80–5.10)
RDW: 15.1 % — AB (ref 11.0–15.0)
WBC: 7.9 10*3/uL (ref 3.8–10.8)

## 2015-04-20 LAB — LIPID PANEL
CHOLESTEROL: 144 mg/dL (ref 125–200)
HDL: 60 mg/dL (ref >=46)
LDL Cholesterol: 75 mg/dL (ref <130)
TRIGLYCERIDES: 47 mg/dL (ref <150)
Total CHOL/HDL Ratio: 2.4 Ratio (ref <=5.0)
VLDL: 9 mg/dL (ref <30)

## 2015-04-20 LAB — BASIC METABOLIC PANEL
BUN: 11 mg/dL (ref 7–25)
CHLORIDE: 107 mmol/L (ref 98–110)
CO2: 26 mmol/L (ref 20–31)
CREATININE: 0.78 mg/dL (ref 0.50–0.99)
Calcium: 8.8 mg/dL (ref 8.6–10.4)
Glucose, Bld: 78 mg/dL (ref 65–99)
POTASSIUM: 3.7 mmol/L (ref 3.5–5.3)
Sodium: 143 mmol/L (ref 135–146)

## 2015-04-20 LAB — VITAMIN D 25 HYDROXY (VIT D DEFICIENCY, FRACTURES): VIT D 25 HYDROXY: 37 ng/mL (ref 30–100)

## 2015-04-20 LAB — HEMOGLOBIN A1C
Hgb A1c MFr Bld: 5.6 % (ref <5.7)
MEAN PLASMA GLUCOSE: 114 mg/dL

## 2015-04-20 LAB — TSH: TSH: 1.24 mIU/L

## 2015-04-20 LAB — FERRITIN: FERRITIN: 107 ng/mL (ref 20–288)

## 2015-04-20 LAB — IRON: IRON: 61 ug/dL (ref 45–160)

## 2015-04-24 ENCOUNTER — Other Ambulatory Visit: Payer: Self-pay

## 2015-04-24 ENCOUNTER — Ambulatory Visit (INDEPENDENT_AMBULATORY_CARE_PROVIDER_SITE_OTHER): Payer: Medicare Other | Admitting: Family Medicine

## 2015-04-24 ENCOUNTER — Encounter: Payer: Self-pay | Admitting: Family Medicine

## 2015-04-24 ENCOUNTER — Ambulatory Visit: Payer: BC Managed Care – PPO | Admitting: Family Medicine

## 2015-04-24 VITALS — BP 120/72 | HR 84 | Resp 16 | Ht 65.0 in | Wt 170.0 lb

## 2015-04-24 DIAGNOSIS — Z0000 Encounter for general adult medical examination without abnormal findings: Secondary | ICD-10-CM | POA: Diagnosis not present

## 2015-04-24 DIAGNOSIS — Z23 Encounter for immunization: Secondary | ICD-10-CM

## 2015-04-24 DIAGNOSIS — R9431 Abnormal electrocardiogram [ECG] [EKG]: Secondary | ICD-10-CM | POA: Diagnosis not present

## 2015-04-24 LAB — POCT URINALYSIS DIPSTICK
BILIRUBIN UA: NEGATIVE
Clarity, UA: NEGATIVE
Glucose, UA: NEGATIVE
LEUKOCYTES UA: NEGATIVE
NITRITE UA: NEGATIVE
PH UA: 5.5
PROTEIN UA: NEGATIVE
RBC UA: NEGATIVE
Spec Grav, UA: 1.025
UROBILINOGEN UA: 0.2

## 2015-04-24 NOTE — Addendum Note (Signed)
Addended by: Eual Fines on: 04/24/2015 02:31 PM   Modules accepted: Orders

## 2015-04-24 NOTE — Assessment & Plan Note (Signed)

## 2015-04-24 NOTE — Progress Notes (Signed)
Subjective:    Patient ID: Gina Nelson, female    DOB: 1950/02/20, 65 y.o.   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 No  Over the past two weeks, have you felt little interest or pleasure in doing things? No  Have you lost interest or pleasure in daily life? No  Do you often feel hopeless? No  Do you cry easily over simple problems? No   Activities of Daily Living  In your present state of health, do you have any difficulty performing the following activities?  Driving?: No Managing money?: No Feeding yourself?:No Getting from bed to chair?:No Climbing a flight of stairs?:yes at times right nee feels stiff Preparing food and eating?:No Bathing or showering?:No Getting dressed?:No Getting to the toilet?:No Using the toilet?:No Moving around from place to place?: No  Fall Risk Assessment In the past year have you fallen or had a near fall?: one fall, dog tripped her on a leash  Are you currently taking any medications that make you dizzy?:No   Hearing Difficulties: No Do you often ask people to speak up or repeat themselves?:No Do you experience ringing or noises in your ears?:No Do you have difficulty understanding soft or  whispered voices?:has an echo sometimes in her left ear   Cognitive Testing  Alert? Yes Normal Appearance?Yes  Oriented to person? Yes Place? Yes  Time? Yes  Displays appropriate judgment?Yes  Can read the correct time from a watch face? yes Are you having problems remembering things?No  Advanced Directives have been discussed with the patient?Yes, brochure given , work in progress, discussing with spouse , full code   List the Names of Other Physician/Practitioners you currently use:  Dr Saunders Glance Dr Glo Herring (gyn)  Dr Octavia Heir (oncology)  Indicate any recent Medical Services you may have received from other than [facility] providers in the past year (date may be approximate).   Assessment:    Welcome to medicare  Plan:     Medicare Attestation  I have personally reviewed:  The patient's medical and social history  Their use of alcohol, tobacco or illicit drugs  Their current medications and supplements  The patient's functional ability including ADLs,fall risks, home safety risks, cognitive, and hearing and visual impairment  Diet and physical activities  Evidence for depression or mood disorders  The patient's weight, height, BMI, and visual acuity have been recorded in the chart. I have made referrals, counseling, and provided education to the patient based on review of the above and I have provided the patient with a written personalized care plan for preventive services.      Review of Systems     Objective:   Physical Exam  BP 120/72 mmHg  Pulse 84  Resp 16  Ht 5\' 5"  (1.651 m)  Wt 170 lb (77.111 kg)  BMI 28.29 kg/m2  SpO2 99%       Assessment & Plan:  Welcome to Medicare preventive visit Annual exam as documented. Counseling done  re healthy lifestyle involving commitment to 150 minutes exercise per week, heart healthy diet, and attaining healthy weight.The importance of adequate sleep also discussed. Regular seat belt use and home safety, is also  discussed. Changes in health habits are decided on by the patient with goals and time frames  set for achieving them. Immunization and cancer screening needs are specifically addressed at this visit.   Nonspecific abnormal electrocardiogram (ECG) (EKG) Asymptomatic , baseline screen, shows t wave inversion appears in more leads than prior for comparison, h/o chemo and radiation treatment for breast cancer, was prediabetic, pt has self corrected with lifestyle change Refer to cardiology for evaluation as deemed necessar  Need for vaccination with 13-polyvalent pneumococcal conjugate vaccine After obtaining informed consent, the vaccine is  administered by LPN.

## 2015-04-24 NOTE — Patient Instructions (Signed)
Annual physical in September, call if you need me before  Increase exercise to 7 days per week, reduce portions so you lose weight  CONGRATS blood sugar now normal  Increase reading  And all things to stimulate brain so memory improves  Watch knee for instability, you have high fall history, will refer to ortho if you call for this  Pneumonia 13 vaccine today  You are referred to cardiology because of abnormal EKG to ensure no heart disease  Continue aspirin 81 mg daily    Fall Prevention in the Home  Falls can cause injuries. They can happen to people of all ages. There are many things you can do to make your home safe and to help prevent falls.  WHAT CAN I DO ON THE OUTSIDE OF MY HOME?  Regularly fix the edges of walkways and driveways and fix any cracks.  Remove anything that might make you trip as you walk through a door, such as a raised step or threshold.  Trim any bushes or trees on the path to your home.  Use bright outdoor lighting.  Clear any walking paths of anything that might make someone trip, such as rocks or tools.  Regularly check to see if handrails are loose or broken. Make sure that both sides of any steps have handrails.  Any raised decks and porches should have guardrails on the edges.  Have any leaves, snow, or ice cleared regularly.  Use sand or salt on walking paths during winter.  Clean up any spills in your garage right away. This includes oil or grease spills. WHAT CAN I DO IN THE BATHROOM?   Use night lights.  Install grab bars by the toilet and in the tub and shower. Do not use towel bars as grab bars.  Use non-skid mats or decals in the tub or shower.  If you need to sit down in the shower, use a plastic, non-slip stool.  Keep the floor dry. Clean up any water that spills on the floor as soon as it happens.  Remove soap buildup in the tub or shower regularly.  Attach bath mats securely with double-sided non-slip rug tape.  Do not  have throw rugs and other things on the floor that can make you trip. WHAT CAN I DO IN THE BEDROOM?  Use night lights.  Make sure that you have a light by your bed that is easy to reach.  Do not use any sheets or blankets that are too big for your bed. They should not hang down onto the floor.  Have a firm chair that has side arms. You can use this for support while you get dressed.  Do not have throw rugs and other things on the floor that can make you trip. WHAT CAN I DO IN THE KITCHEN?  Clean up any spills right away.  Avoid walking on wet floors.  Keep items that you use a lot in easy-to-reach places.  If you need to reach something above you, use a strong step stool that has a grab bar.  Keep electrical cords out of the way.  Do not use floor polish or wax that makes floors slippery. If you must use wax, use non-skid floor wax.  Do not have throw rugs and other things on the floor that can make you trip. WHAT CAN I DO WITH MY STAIRS?  Do not leave any items on the stairs.  Make sure that there are handrails on both sides of the stairs  and use them. Fix handrails that are broken or loose. Make sure that handrails are as long as the stairways.  Check any carpeting to make sure that it is firmly attached to the stairs. Fix any carpet that is loose or worn.  Avoid having throw rugs at the top or bottom of the stairs. If you do have throw rugs, attach them to the floor with carpet tape.  Make sure that you have a light switch at the top of the stairs and the bottom of the stairs. If you do not have them, ask someone to add them for you. WHAT ELSE CAN I DO TO HELP PREVENT FALLS?  Wear shoes that:  Do not have high heels.  Have rubber bottoms.  Are comfortable and fit you well.  Are closed at the toe. Do not wear sandals.  If you use a stepladder:  Make sure that it is fully opened. Do not climb a closed stepladder.  Make sure that both sides of the stepladder are  locked into place.  Ask someone to hold it for you, if possible.  Clearly mark and make sure that you can see:  Any grab bars or handrails.  First and last steps.  Where the edge of each step is.  Use tools that help you move around (mobility aids) if they are needed. These include:  Canes.  Walkers.  Scooters.  Crutches.  Turn on the lights when you go into a dark area. Replace any light bulbs as soon as they burn out.  Set up your furniture so you have a clear path. Avoid moving your furniture around.  If any of your floors are uneven, fix them.  If there are any pets around you, be aware of where they are.  Review your medicines with your doctor. Some medicines can make you feel dizzy. This can increase your chance of falling. Ask your doctor what other things that you can do to help prevent falls.   This information is not intended to replace advice given to you by your health care provider. Make sure you discuss any questions you have with your health care provider.   Document Released: 10/26/2008 Document Revised: 05/16/2014 Document Reviewed: 02/03/2014 Elsevier Interactive Patient Education [employer].

## 2015-04-24 NOTE — Assessment & Plan Note (Signed)
After obtaining informed consent, the vaccine is  administered by LPN.  

## 2015-04-24 NOTE — Assessment & Plan Note (Signed)
Asymptomatic , baseline screen, shows t wave inversion appears in more leads than prior for comparison, h/o chemo and radiation treatment for breast cancer, was prediabetic, pt has self corrected with lifestyle change Refer to cardiology for evaluation as deemed necessar

## 2015-05-04 ENCOUNTER — Encounter: Payer: Self-pay | Admitting: Family Medicine

## 2015-05-15 ENCOUNTER — Other Ambulatory Visit: Payer: Self-pay | Admitting: Oncology

## 2015-05-17 ENCOUNTER — Ambulatory Visit (INDEPENDENT_AMBULATORY_CARE_PROVIDER_SITE_OTHER): Payer: Medicare Other | Admitting: Cardiovascular Disease

## 2015-05-17 ENCOUNTER — Encounter: Payer: Self-pay | Admitting: Cardiovascular Disease

## 2015-05-17 VITALS — BP 108/70 | HR 75 | Ht 65.0 in | Wt 168.0 lb

## 2015-05-17 DIAGNOSIS — R9431 Abnormal electrocardiogram [ECG] [EKG]: Secondary | ICD-10-CM | POA: Diagnosis not present

## 2015-05-17 NOTE — Progress Notes (Signed)
Patient ID: Gina Nelson, female   DOB: 1950/12/18, 65 y.o.   MRN: [id number]       CARDIOLOGY CONSULT NOTE  Patient ID: Gina Nelson MRN: [id number] DOB/AGE: May 18, 1950 65 y.o.  Admit date: (Not on file) Primary Physician: Tula Nakayama, MD Referring Physician: [name] MD  Reason for Consultation: abnormal ECG  HPI: The patient is a 65 yr old woman with a PMH significant for breast cancer. An ECG performedBy her PCP in early April 2017 demonstrated sinus rhythm with T-wave inversions in V1 through V3.  She is entirely asymptomatic. Specifically, she denies any symptoms of chest pain, palpitations, lightheadedness, dizziness, leg swelling, orthopnea, PND, and syncope. She may feel slightly short of breath when doing something strenuous but this hasn't progressed over the years. Denies a personal h/o HTN, hyperlipidemia, and diabetes. No family h/o premature CAD.  Able to climb a flight of stairs slowly due to h/o right TKA.   Allergies  Allergen Reactions  . Codeine Itching    Arms and face    Current Outpatient Prescriptions  Medication Sig Dispense Refill  . acetaminophen (TYLENOL) 500 MG tablet Take 1,000 mg by mouth every 6 (six) hours as needed for headache.    Marland Kitchen aspirin 81 MG tablet Take 81 mg by mouth daily.    . Bisacodyl (LAXATIVE PO) Take 1-2 tablets by mouth at bedtime.     . Multiple Vitamins-Minerals (MULTI-VITAMIN GUMMIES) CHEW Chew 2 tablets by mouth daily.    . tamoxifen (NOLVADEX) 20 MG tablet TAKE ONE TABLET BY MOUTH ONCE DAILY 90 tablet 0   No current facility-administered medications for this visit.    Past Medical History  Diagnosis Date  . Arthritis     knee  . Blood transfusion   . Cancer [facility])     right breast T1cN2  . Allergy   . PONV (postoperative nausea and vomiting)     Past Surgical History  Procedure Laterality Date  . Total knee arthroplasty  2011  . Abdominal hysterectomy  1984  . Port-a-cath removal  07/16/10  .  Colonoscopy  05/07/2011    Procedure: COLONOSCOPY;  Surgeon: Rogene Houston, MD;  [facility];  Service: Endoscopy;  Laterality: N/A;  830  . Joint replacement  11/2009    right knee  . Breast surgery  right MRM, left simple mastectomy    2012  . Breast surgery  left mastectomy    2012    Social History   Social History  . Marital Status: Married    Spouse Name: N/A  . Number of Children: N/A  . Years of Education: N/A   Occupational History  . Not on file.   Social History Main Topics  . Smoking status: Never Smoker   . Smokeless tobacco: Never Used  . Alcohol Use: No  . Drug Use: No  . Sexual Activity: Yes   Other Topics Concern  . Not on file   Social History Narrative     No family history of premature CAD in 1st degree relatives.  Prior to Admission medications   Medication Sig Start Date End Date Taking? Authorizing Provider  acetaminophen (TYLENOL) 500 MG tablet Take 1,000 mg by mouth every 6 (six) hours as needed for headache.   Yes Historical Provider, MD  aspirin 81 MG tablet Take 81 mg by mouth daily.   Yes Historical Provider, MD  Bisacodyl (LAXATIVE PO) Take 1-2 tablets by mouth at bedtime.    Yes Historical Provider, MD  Multiple Vitamins-Minerals (MULTI-VITAMIN GUMMIES) CHEW Chew 2 tablets by mouth daily.   Yes Historical Provider, MD  tamoxifen (NOLVADEX) 20 MG tablet TAKE ONE TABLET BY MOUTH ONCE DAILY 05/15/15  Yes Chauncey Cruel, MD     Review of systems complete and found to be negative unless listed above in HPI     Physical exam Blood pressure 108/70, pulse 75, height 5\' 5"  (1.651 m), weight 168 lb (76.204 kg), SpO2 95 %. General: NAD Neck: No JVD, no thyromegaly or thyroid nodule.  Lungs: Clear to auscultation bilaterally with normal respiratory effort. CV: Nondisplaced PMI. Regular rate and rhythm, normal S1/S2, no S3/S4, no murmur.  No peripheral edema.  No carotid bruit.    Abdomen: Soft, nontender, no distention.    Skin: Intact without lesions or rashes.  Neurologic: Alert and oriented x 3.  Psych: Normal affect. Extremities: No clubbing or cyanosis.  HEENT: Normal.   ECG: Most recent ECG reviewed.  Labs:   Lab Results  Component Value Date   WBC 7.9 04/19/2015   HGB 11.1* 04/19/2015   HCT 36.4 04/19/2015   MCV 84.3 04/19/2015   PLT 194 04/19/2015   No results for input(s): NA, K, CL, CO2, BUN, CREATININE, CALCIUM, PROT, BILITOT, ALKPHOS, ALT, AST, GLUCOSE in the last 168 hours.  Invalid input(s): LABALBU Lab Results  Component Value Date   TROPONINI <0.30 11/13/2013    Lab Results  Component Value Date   CHOL 144 04/19/2015   CHOL 154 02/21/2014   CHOL 148 02/22/2013   Lab Results  Component Value Date   HDL 60 04/19/2015   HDL 64 02/21/2014   HDL 47 02/22/2013   Lab Results  Component Value Date   LDLCALC 75 04/19/2015   LDLCALC 78 02/21/2014   LDLCALC 87 02/22/2013   Lab Results  Component Value Date   TRIG 47 04/19/2015   TRIG 61 02/21/2014   TRIG 71 02/22/2013   Lab Results  Component Value Date   CHOLHDL 2.4 04/19/2015   CHOLHDL 2.4 02/21/2014   CHOLHDL 3.1 02/22/2013   No results found for: LDLDIRECT       Studies: No results found.  ASSESSMENT AND PLAN:  1. Abnormal ECG: She is entirely asymptomatic, and lacks significant risk factors for CAD. There is no family h/o premature CAD. Physical exam is also normal. I do not feel CV testing is warranted at this time.  Dispo: fu prn.   Signed: Kate Sable, M.D., F.A.C.C.  05/17/2015, 9:17 AM

## 2015-05-17 NOTE — Patient Instructions (Signed)
Your physician recommends that you schedule a follow-up appointment in:  As needed        Thank you for choosing [facility] !         

## 2015-06-25 ENCOUNTER — Ambulatory Visit: Payer: Medicare Other | Attending: General Surgery | Admitting: Physical Therapy

## 2015-06-25 DIAGNOSIS — I890 Lymphedema, not elsewhere classified: Secondary | ICD-10-CM

## 2015-06-25 NOTE — Therapy (Signed)
[place], [place], [address] Phone: [phone]   Fax:  [phone]  Physical Therapy Evaluation  Patient Details  Name: Gina Nelson MRN: [id number] Date of Birth: 07/08/1950 Referring Provider: Dr. [name]  Encounter Date: 06/25/2015      PT End of Session - 06/25/15 2050    Visit Number 1   Number of Visits 5   Date for PT Re-Evaluation 07/24/15   PT Start Time 1302   PT Stop Time 1349   PT Time Calculation (min) 47 min   Activity Tolerance Patient tolerated treatment well   Behavior During Therapy [facility] for tasks assessed/performed      Past Medical History  Diagnosis Date  . Arthritis     knee  . Blood transfusion   . Cancer [facility])     right breast T1cN2  . Allergy   . PONV (postoperative nausea and vomiting)     Past Surgical History  Procedure Laterality Date  . Total knee arthroplasty  2011  . Abdominal hysterectomy  1984  . Port-a-cath removal  07/16/10  . Colonoscopy  05/07/2011    Procedure: COLONOSCOPY;  Surgeon: Rogene Houston, MD;  [facility];  Service: Endoscopy;  Laterality: N/A;  830  . Joint replacement  11/2009    right knee  . Breast surgery  right MRM, left simple mastectomy    2012  . Breast surgery  left mastectomy    2012    There were no vitals filed for this visit.       Subjective Assessment - 06/25/15 1307    Subjective Two weeks ago I started having swelling in my hand and tightness in my knuckes.  I monitored it for a week and then I called Dr. Bertrum Sol office and he said he was glad I jumped on it early.   Pertinent History Bilateral mastectomy June 2012 for right breast cancer; 14 lymph nodes removed on the right, 9 positive.  Had neo-adjuvant chemo; adjuvant radiation completed September 2012.  Currently on tamoxifen.  Sees Dr. Jana Hakim once a year in September.  No other health issues. TKA 11/28/2009 on right.   Patient  Stated Goals Get some therapy for my hand and arm; I don't want it to be a big swollen situation.   Currently in Pain? No/denies  just tightness in the hand in the morning; swelling varies during the day; sometimes tender across MCP joints            [facility] PT Assessment - 06/25/15 0001    Assessment   Medical Diagnosis lymphedema right arm   Referring Provider Dr. [name]   Onset Date/Surgical Date 06/29/10  approx.   Hand Dominance Right   Precautions   Precautions Other (comment)   Precaution Comments cancer precautions   Restrictions   Weight Bearing Restrictions No   Balance Screen   Has the patient fallen in the past 6 months Yes  dog's leash tripped her   How many times? 1   Has the patient had a decrease in activity level because of a fear of falling?  No   Is the patient reluctant to leave their home because of a fear of falling?  No   Home Environment   Living Environment Private residence   Living Arrangements Spouse/significant other   Type of [place] One level   Prior Function   Level of [place] Retired  Leisure exercise bike 2-4x/week, 15 minutes twice a day   Cognition   Overall Cognitive Status Within Functional Limits for tasks assessed   Observation/Other Assessments   Other Surveys  --  lymphedema life impact score is 4 (7% impairment)   ROM / Strength   AROM / PROM / Strength AROM;Strength   AROM   Overall AROM Comments both shoulders WFL all motions   Strength   Overall Strength Comments she reports less strength in right hand than prior to treatment; has difficulty with things like bottle caps   Ambulation/Gait   Ambulation/Gait Yes           LYMPHEDEMA/ONCOLOGY QUESTIONNAIRE - 06/25/15 1316    Type   Cancer Type right breast   Surgeries   Mastectomy Date 06/29/10  approx.; bilateral mastectomies   Number Lymph Nodes Removed 14   Date Lymphedema/Swelling Started   Date 06/10/15    Treatment   Past Chemotherapy Treatment Yes   Past Radiation Treatment Yes   What other symptoms do you have   Are you Having Heaviness or Tightness Yes   Are you having pitting edema Yes   Body Site --  right hand, mild   Stemmer Sign No   Lymphedema Stage   Stage STAGE 1 SPONTANEOUSLY REVERSIBLE   Lymphedema Assessments   Lymphedema Assessments Upper extremities   Right Upper Extremity Lymphedema   10 cm Proximal to Olecranon Process 30.5 cm   Olecranon Process 26 cm   10 cm Proximal to Ulnar Styloid Process 22.8 cm   Just Proximal to Ulnar Styloid Process 16.5 cm   Across Hand at [employer] 19.6 cm   At [place] of 2nd Digit 6.5 cm   Left Upper Extremity Lymphedema   10 cm Proximal to Olecranon Process 30.9 cm   Olecranon Process 25.6 cm   10 cm Proximal to Ulnar Styloid Process 21.3 cm   Just Proximal to Ulnar Styloid Process 15.8 cm   Across Hand at [employer] 18.5 cm   At [place] of 2nd Digit 6 cm                [facility] Adult PT Treatment/Exercise - 06/25/15 0001    Self-Care   Self-Care Other Self-Care Comments   Other Self-Care Comments  Gave patient prescription for Class I compression sleeve and glove for her to ask therapists at [facility] to get signed by [name]; gave instruction about obtaining sleeve and glove; began instruction in infection risk and lymphedema risk reduction                PT Education - 06/25/15 2049    Education provided Yes   Education Details began instruction about lymphedema, mainly about infection risk, signs, treatment, avoidance   Person(s) Educated Patient   Methods Explanation;Handout  lymph risk reduction practices; Living Beyond Breast Cancer lymphedema pamphlet   Comprehension Need further instruction                [facility] Goals - 06/25/15 2101    CC Long Term Goal  #1   Title Pt. will be independent in performing self-manual lymph drainage correctly.   Time 3   Period Weeks    Status New   CC Long Term Goal  #2   Title Pt. will know how and where to obtain daytime compression garments.   Time 3   Period Weeks   Status New   CC Long Term Goal  #3  Title Pt. will be knowledgeable about lymphedema risk reduction practices.   Time 3   Period Weeks   Status New            Plan - 06/25/15 05-06-2050    Clinical Impression Statement Pleasant woman who is s/p right breast cancer with neo-adjuvant chemo, bilateral mastectomies, and radiation therapy; had 14 lymph nodes removed on right.  She developed swelling about two weeks ago and contacted [name] fairly soon after that.  This appears to be stage I lymphedema; it is mild and she reports spontaneous resolution then return.  She has a h/o right TKA but is otherwise fairly healthy.  We discussed treatment options and she is not interested in bandaging nor pursuing a lymphedema pump at this time.     Rehab Potential Excellent   PT Frequency --  2-3 visits total   PT Duration 4 weeks   PT Treatment/Interventions ADLs/Self Care Home Management;DME Instruction;Therapeutic exercise;Patient/family education;Orthotic Fit/Training;Manual techniques;Manual lymph drainage;Taping   PT Next Visit Plan Do manual lymph drainage and begin instructing in self-manual lymph drainage.  Fax prescription for compression sleeve and glove to [name].  Assist with obtaining compression garments as needed.  (Since swelling resolves at night, probably does not need a nighttime garment now.)  Educate further about use of compression sleeve and glove and about lymphedema risk reduction.     Recommended Other Services Pt. will transfer to [place] and Agree with Plan of Care Patient      Patient will benefit from skilled therapeutic intervention in order to improve the following deficits and impairments:  Increased edema, Decreased knowledge of precautions, Decreased knowledge of use of DME  Visit  Diagnosis: Lymphedema, not elsewhere classified - Plan: PT plan of care cert/re-cert      G-Codes - [id number] May 05, 2101    Functional Assessment Tool Used lymphedema life impact scale   Functional Limitation Self care   Self Care Current Status [id number]) At least 1 percent but less than 20 percent impaired, limited or restricted   Self Care Goal Status [id number]) At least 1 percent but less than 20 percent impaired, limited or restricted       Problem List Patient Active Problem List   Diagnosis Date Noted  . Welcome to Medicare preventive visit 04/24/2015  . Nonspecific abnormal electrocardiogram (ECG) (EKG) 04/24/2015  . Need for vaccination with 13-polyvalent pneumococcal conjugate vaccine 04/24/2015  . Postmenopause atrophic vaginitis 11/23/2014  . Overweight (BMI 25.0-29.9) 07/26/2014  . Prediabetes 09/26/2012  . Seasonal allergies 04/30/2011  . Breast cancer-right T2N1. 07/29/2010  . 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

## 2015-06-28 ENCOUNTER — Encounter: Payer: Self-pay | Admitting: Family Medicine

## 2015-06-28 ENCOUNTER — Telehealth: Payer: Self-pay

## 2015-06-28 ENCOUNTER — Ambulatory Visit (INDEPENDENT_AMBULATORY_CARE_PROVIDER_SITE_OTHER): Payer: Medicare Other | Admitting: Family Medicine

## 2015-06-28 VITALS — BP 120/72 | HR 78 | Resp 18 | Ht 65.0 in | Wt 168.0 lb

## 2015-06-28 DIAGNOSIS — N3000 Acute cystitis without hematuria: Secondary | ICD-10-CM | POA: Diagnosis not present

## 2015-06-28 DIAGNOSIS — N3090 Cystitis, unspecified without hematuria: Secondary | ICD-10-CM | POA: Diagnosis not present

## 2015-06-28 DIAGNOSIS — E663 Overweight: Secondary | ICD-10-CM | POA: Diagnosis not present

## 2015-06-28 LAB — POCT URINALYSIS DIPSTICK
Bilirubin, UA: NEGATIVE
GLUCOSE UA: NEGATIVE
Ketones, UA: NEGATIVE
Nitrite, UA: POSITIVE
PH UA: 6
Protein, UA: NEGATIVE
RBC UA: NEGATIVE
SPEC GRAV UA: 1.025
UROBILINOGEN UA: 1

## 2015-06-28 MED ORDER — CIPROFLOXACIN HCL 500 MG PO TABS: 500.0000 mg | ORAL_TABLET | Freq: Two times a day (BID) | ORAL | Status: DC

## 2015-06-28 MED ORDER — FLUCONAZOLE 150 MG PO TABS: 150.0000 mg | ORAL_TABLET | Freq: Once | ORAL | Status: DC

## 2015-06-28 NOTE — Progress Notes (Signed)
   Subjective:    Patient ID: Gina Nelson, female    DOB: Nov 24, 1950, 65 y.o.   MRN: [id number]  HPI 3 day h/o burning and frequency in urination, no fever , chills or flank pain, prior to this has been well\Increased stress due to death in th family preceeding symptom onset   Review of Systems See HPI Denies recent fever or chills. Denies sinus pressure, nasal congestion, ear pain or sore throat. Denies chest congestion, productive cough or wheezing. Denies chest pains, palpitations and leg swelling . Denies depression,or insomnia. Denies skin break down or rash.        Objective:   Physical Exam BP 120/72 mmHg  Pulse 78  Resp 18  Ht 5\' 5"  (1.651 m)  Wt 168 lb (76.204 kg)  BMI 27.96 kg/m2  SpO2 100% Patient alert and oriented and in no cardiopulmonary distress.  HEENT: No facial asymmetry, EOMI,   oropharynx pink and moist.  Neck supple no JVD, no mass.  Chest: Clear to auscultation bilaterally.  CVS: S1, S2 no murmurs, no S3.Regular rate.  ABD: Soft non tender. No renal angle or suprapubic tenderness  Ext: No edema  MS: Adequate ROM spine, shoulders, hips and knees.  Skin: Intact, no ulcerations or rash noted.      Assessment & Plan:  Acute cystitis 3 day history of acute symptoms and abn UA, treat empirically and f/u culture  Overweight (BMI 25.0-29.9) Unchanged Patnt re-educated about  the importance of commitment to a  minimum of 150 minutes of exercise per week.  The importance of healthy food choices with portion control discussed. Encouraged to start a food diary, count calories and to consider  joining a support group. Sample diet sheets offered. Goals set by the patient for the next several months.   Weight /BMI 06/28/2015 05/17/2015 04/24/2015  WEIGHT 168 lb 168 lb 170 lb  HEIGHT 5\' 5"  5\' 5"  5\' 5"   BMI 27.96 kg/m2 27.96 kg/m2 28.29 kg/m2    Current exercise per week 90 minutes.

## 2015-06-28 NOTE — Telephone Encounter (Signed)
Patient added to schedule.

## 2015-06-28 NOTE — Patient Instructions (Signed)
F/u as before , call if you need me sooner  You are treated for urinary tract infection  Drink at least 8 glasses water daily  Three days of antibiotics prescribed

## 2015-06-29 ENCOUNTER — Other Ambulatory Visit (HOSPITAL_COMMUNITY)
Admission: RE | Admit: 2015-06-29 | Discharge: 2015-06-29 | Disposition: A | Discharge status: Home / Self Care | Payer: Medicare Other | Source: Other Acute Inpatient Hospital | Attending: Family Medicine | Admitting: Family Medicine

## 2015-06-29 DIAGNOSIS — N3090 Cystitis, unspecified without hematuria: Secondary | ICD-10-CM | POA: Diagnosis present

## 2015-07-01 LAB — URINE CULTURE

## 2015-07-01 NOTE — Assessment & Plan Note (Signed)
3 day history of acute symptoms and abn UA, treat empirically and f/u culture

## 2015-07-01 NOTE — Assessment & Plan Note (Signed)
Unchanged Patnt re-educated about  the importance of commitment to a  minimum of 150 minutes of exercise per week.  The importance of healthy food choices with portion control discussed. Encouraged to start a food diary, count calories and to consider  joining a support group. Sample diet sheets offered. Goals set by the patient for the next several months.   Weight /BMI 06/28/2015 05/17/2015 04/24/2015  WEIGHT 168 lb 168 lb 170 lb  HEIGHT 5\' 5"  5\' 5"  5\' 5"   BMI 27.96 kg/m2 27.96 kg/m2 28.29 kg/m2    Current exercise per week 90 minutes.

## 2015-07-03 ENCOUNTER — Ambulatory Visit (HOSPITAL_COMMUNITY): Payer: Medicare Other | Attending: Family Medicine | Admitting: Physical Therapy

## 2015-07-03 DIAGNOSIS — I890 Lymphedema, not elsewhere classified: Secondary | ICD-10-CM | POA: Insufficient documentation

## 2015-07-03 NOTE — Therapy (Signed)
[place], [place], [address] Phone: [phone]   Fax:  [phone]  Physical Therapy Treatment  Patient Details  Name: Gina Nelson MRN: [id number] Date of Birth: 18-Aug-1950 Referring Provider: Dr. [name]  Encounter Date: 07/03/2015      PT End of Session - 07/03/15 0902    Visit Number 2   Number of Visits 5   Date for PT Re-Evaluation 07/24/15   Authorization Type [employer]   Authorization - Visit Number 2   Authorization - Number of Visits 5   PT Start Time 0815   PT Stop Time 0900   PT Time Calculation (min) 45 min   Activity Tolerance Patient tolerated treatment well   Behavior During Therapy [facility] for tasks assessed/performed      Past Medical History  Diagnosis Date  . Arthritis     knee  . Blood transfusion   . Cancer [facility])     right breast T1cN2  . Allergy   . PONV (postoperative nausea and vomiting)     Past Surgical History  Procedure Laterality Date  . Total knee arthroplasty  2011  . Abdominal hysterectomy  1984  . Port-a-cath removal  07/16/10  . Colonoscopy  05/07/2011    Procedure: COLONOSCOPY;  Surgeon: Rogene Houston, MD;  [facility];  Service: Endoscopy;  Laterality: N/A;  830  . Joint replacement  11/2009    right knee  . Breast surgery  right MRM, left simple mastectomy    2012  . Breast surgery  left mastectomy    2012    There were no vitals filed for this visit.      Subjective Assessment - 07/03/15 0858    Subjective Pt states she has no pain.  States she has not been able to read the literature given to her on lymphedema.  Pt has forgotten her prescription for her garment.   Pertinent History Bilateral mastectomy June 2012 for right breast cancer; 14 lymph nodes removed on the right, 9 positive.  Had neo-adjuvant chemo; adjuvant radiation completed September 2012.  Currently on tamoxifen.  Sees Dr. Jana Hakim once a year in September.  No other  health issues. TKA 11/28/2009 on right.   Patient Stated Goals Get some therapy for my hand and arm; I don't want it to be a big swollen situation.   Currently in Pain? No/denies                         [facility] Adult PT Treatment/Exercise - 07/03/15 0001    Manual Therapy   Manual Therapy Manual Lymphatic Drainage (MLD)   Manual Lymphatic Drainage (MLD) supraclavicular, deep and superficial abdominal, routed fluid using intraaxillary and axillary/inguinal anastomisis followed by Rt UE. Anterior done supine with postrerior done prone                 PT Education - 07/03/15 0901    Education provided Yes   Education Details self massage and postrual exercises    Person(s) Educated Patient   Methods Explanation;Demonstration;Handout   Comprehension Verbalized understanding;Returned demonstration          PT Short Term Goals - 07/03/15 0929    PT SHORT TERM GOAL #1   Title Pt will be independent in performing self manual drainage massage    Time 3   Period Weeks   Status On-going   PT SHORT TERM GOAL #2   Title  Pt will knoe how and where to obtain a compression garment   Time 3   Period Weeks   Status On-going   PT SHORT TERM GOAL #3   Title Pt will be knowledgable about lymphedema risk reduction practices    Time 3   Period Weeks   Status On-going              Long Term Clinic Goals - 06/25/15 2101    CC Long Term Goal  #1   Title Pt. will be independent in performing self-manual lymph drainage correctly.   Time 3   Period Weeks   Status New   CC Long Term Goal  #2   Title Pt. will know how and where to obtain daytime compression garments.   Time 3   Period Weeks   Status New   CC Long Term Goal  #3   Title Pt. will be knowledgeable about lymphedema risk reduction practices.   Time 3   Period Weeks   Status New            Plan - 07/03/15 0902    Clinical Impression Statement Initiated manual lymphe decongestion techniques,  educated on lymphedema and self massaging techniques.  Pt did not remember her prescription so therapist will send one to MD.  Noted increased induration in forearm are.    Rehab Potential Excellent   PT Frequency --  2-3 visits total   PT Duration 4 weeks   PT Treatment/Interventions ADLs/Self Care Home Management;DME Instruction;Therapeutic exercise;Patient/family education;Orthotic Fit/Training;Manual techniques;Manual lymph drainage;Taping   PT Next Visit Plan Continue with manual technique and educating in lyphedema    PT Home Exercise Plan given    Consulted and Agree with Plan of Care Patient      Patient will benefit from skilled therapeutic intervention in order to improve the following deficits and impairments:  Increased edema, Decreased knowledge of precautions, Decreased knowledge of use of DME  Visit Diagnosis: Lymphedema, not elsewhere classified     Problem List Patient Active Problem List   Diagnosis Date Noted  . Acute cystitis 06/28/2015  . Postmenopause atrophic vaginitis 11/23/2014  . Overweight (BMI 25.0-29.9) 07/26/2014  . Prediabetes 09/26/2012  . Seasonal allergies 04/30/2011  . Breast cancer-right T2N1. 07/29/2010  . 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

## 2015-07-05 ENCOUNTER — Ambulatory Visit (HOSPITAL_COMMUNITY): Payer: Medicare Other | Admitting: Physical Therapy

## 2015-07-05 DIAGNOSIS — I890 Lymphedema, not elsewhere classified: Secondary | ICD-10-CM | POA: Diagnosis not present

## 2015-07-05 NOTE — Therapy (Signed)
[place] [facility], [place], [address] Phone: [phone]   Fax:  [phone]  Physical Therapy Treatment  Patient Details  Name: Gina Nelson MRN: [id number] Date of Birth: [birth year] Referring Provider: Dr. [name]  Encounter Date: 07/05/2015      PT End of Session - 07/05/15 1210    Visit Number 3   Number of Visits 5   Date for PT Re-Evaluation 07/24/15   Authorization Type U[employer]   Authorization - Visit Number 3   Authorization - Number of Visits 5   PT Start Time 0815   PT Stop Time [id number]   PT Time Calculation (min) 40 min   Activity Tolerance Patient tolerated treatment well   Behavior During Therapy [facility] for tasks assessed/performed      Past Medical History  Diagnosis Date  . Arthritis     knee  . Blood transfusion   . Cancer [facility])     right breast T1cN2  . Allergy   . PONV (postoperative nausea and vomiting)     Past Surgical History  Procedure Laterality Date  . Total knee arthroplasty  2011  . Abdominal hysterectomy  1984  . Port-a-cath removal  07/16/10  . Colonoscopy  05/07/2011    Procedure: COLONOSCOPY;  Surgeon: Rogene Houston, MD;  [facility];  Service: Endoscopy;  Laterality: N/A;  830  . Joint replacement  11/2009    right knee  . Breast surgery  right MRM, left simple mastectomy    2012  . Breast surgery  left mastectomy    2012    There were no vitals filed for this visit.      Subjective Assessment - 07/05/15 1208    Subjective Pt states she can tell a difference in her arm with less swelling and tightness.  Pt now has her prescription for her garment.   Currently in Pain? No/denies                         [facility] Adult PT Treatment/Exercise - 07/05/15 1210    Manual Therapy   Manual Therapy Manual Lymphatic Drainage (MLD)   Manual Lymphatic Drainage (MLD) supraclavicular, deep and superficial abdominal, routed fluid using intraaxillary  and axillary/inguinal anastomisis followed by Rt UE. Anterior done supine with postrerior done prone                 PT Education - 07/05/15 1213    Education provided Yes   Education Details introduced reidsleeve.  instructed to make appointment to be fitted for garment.    Person(s) Educated Patient   Methods Explanation;Handout   Comprehension Verbalized understanding          PT Short Term Goals - 07/03/15 0929    PT SHORT TERM GOAL #1   Title Pt will be independent in performing self manual drainage massage    Time 3   Period Weeks   Status On-going   PT SHORT TERM GOAL #2   Title Pt will knoe how and where to obtain a compression garment   Time 3   Period Weeks   Status On-going   PT SHORT TERM GOAL #3   Title Pt will be knowledgable about lymphedema risk reduction practices    Time 3   Period Weeks   Status On-going              Long Term Clinic Goals - 06/25/15 2101  CC Long Term Goal  #1   Title Pt. will be independent in performing self-manual lymph drainage correctly.   Time 3   Period Weeks   Status New   CC Long Term Goal  #2   Title Pt. will know how and where to obtain daytime compression garments.   Time 3   Period Weeks   Status New   CC Long Term Goal  #3   Title Pt. will be knowledgeable about lymphedema risk reduction practices.   Time 3   Period Weeks   Status New            Plan - 07/05/15 1211    Clinical Impression Statement Instucted to contact clinic of her choice to be fitted for a compression sleeve and gauntlet.  Pt reported she would choose [facility] or [facility].  Pt shown the reidsleeve and explained it's purpose and to let us know if she decides she needs one.  Only with induration in her proximal forearm region today.   Rehab Potential Excellent   PT Frequency --  2-3 visits total   PT Duration 4 weeks   PT Treatment/Interventions ADLs/Self Care Home Management;DME  Instruction;Therapeutic exercise;Patient/family education;Orthotic Fit/Training;Manual techniques;Manual lymph drainage;Taping   PT Next Visit Plan Continue with manual technique and educating in lyphedema.  follow up with garment fitting appointment.   PT Home Exercise Plan given    Consulted and Agree with Plan of Care Patient      Patient will benefit from skilled therapeutic intervention in order to improve the following deficits and impairments:  Increased edema, Decreased knowledge of precautions, Decreased knowledge of use of DME  Visit Diagnosis: Lymphedema, not elsewhere classified     Problem List Patient Active Problem List   Diagnosis Date Noted  . Acute cystitis 06/28/2015  . Postmenopause atrophic vaginitis 11/23/2014  . Overweight (BMI 25.0-29.9) 07/26/2014  . Prediabetes 09/26/2012  . Seasonal allergies 04/30/2011  . Breast cancer-right T2N1. 07/29/2010  . KELOID 01/11/2009    Teena Irani, PTA/CLT [phone]  07/05/2015, 12:15 PM  [place] [address] [place], [place], [address] Phone: [phone]   Fax:  [phone]  Name: Gina Nelson MRN: [id number] Date of Birth: [birth year]

## 2015-07-09 ENCOUNTER — Ambulatory Visit (HOSPITAL_COMMUNITY): Payer: Medicare Other | Admitting: Physical Therapy

## 2015-07-09 DIAGNOSIS — I890 Lymphedema, not elsewhere classified: Secondary | ICD-10-CM | POA: Diagnosis not present

## 2015-07-09 NOTE — Therapy (Signed)
[place], [place], [address] Phone: [phone]   Fax:  [phone]  Physical Therapy Treatment  Patient Details  Name: Gina Nelson MRN: [id number] Date of Birth: 08-14-1950 Referring Provider: Dr. [name]  Encounter Date: 07/09/2015      PT End of Session - 07/09/15 0941    Visit Number 4   Number of Visits 5   Date for PT Re-Evaluation 07/24/15   Authorization Type UHC   Authorization - Visit Number 4   Authorization - Number of Visits 5   PT Start Time 2094   PT Stop Time 0936   PT Time Calculation (min) 41 min   Activity Tolerance Patient tolerated treatment well   Behavior During Therapy [facility] for tasks assessed/performed      Past Medical History  Diagnosis Date  . Arthritis     knee  . Blood transfusion   . Cancer [facility])     right breast T1cN2  . Allergy   . PONV (postoperative nausea and vomiting)     Past Surgical History  Procedure Laterality Date  . Total knee arthroplasty  2011  . Abdominal hysterectomy  1984  . Port-a-cath removal  07/16/10  . Colonoscopy  05/07/2011    Procedure: COLONOSCOPY;  Surgeon: Rogene Houston, MD;  [facility];  Service: Endoscopy;  Laterality: N/A;  830  . Joint replacement  11/2009    right knee  . Breast surgery  right MRM, left simple mastectomy    2012  . Breast surgery  left mastectomy    2012    There were no vitals filed for this visit.      Subjective Assessment - 07/09/15 0939    Subjective Pt states that she got measured for her garment on Friday.  They did not have her size in stock so they are ordering it.  Pt should have her garment this week.  Pt has been completing exercises and self massage with no questions.    Pertinent History Bilateral mastectomy June 2012 for right breast cancer; 14 lymph nodes removed on the right, 9 positive.  Had neo-adjuvant chemo; adjuvant radiation completed September 2012.  Currently on  tamoxifen.  Sees Dr. Jana Hakim once a year in September.  No other health issues. TKA 11/28/2009 on right.   Patient Stated Goals Get some therapy for my hand and arm; I don't want it to be a big swollen situation.   Currently in Pain? No/denies                         [facility] Adult PT Treatment/Exercise - 07/09/15 0001    Manual Therapy   Manual Therapy Manual Lymphatic Drainage (MLD)   Manual Lymphatic Drainage (MLD) supraclavicular, deep and superficial abdominal, routed fluid using intraaxillary and axillary/inguinal anastomisis followed by Rt UE. Anterior done supine with postrerior done prone                   PT Short Term Goals - 07/09/15 0943    PT SHORT TERM GOAL #1   Title Pt will be independent in performing self manual drainage massage    Time 3   Period Weeks   Status Achieved   PT SHORT TERM GOAL #2   Title Pt will knoe how and where to obtain a compression garment   Time 3   Period Weeks   Status Achieved   PT SHORT TERM  GOAL #3   Title Pt will be knowledgable about lymphedema risk reduction practices    Time 3   Period Weeks   Status Partially [place] - 06/25/15 2101    CC Long Term Goal  #1   Title Pt. will be independent in performing self-manual lymph drainage correctly.   Time 3   Period Weeks   Status New   CC Long Term Goal  #2   Title Pt. will know how and where to obtain daytime compression garments.   Time 3   Period Weeks   Status New   CC Long Term Goal  #3   Title Pt. will be knowledgeable about lymphedema risk reduction practices.   Time 3   Period Weeks   Status New            Plan - 07/09/15 9872    Clinical Impression Statement Pt progressing well.  Will ensure pt has no questions on donning and doffing as well as care for her compression garment next visit then she will be ready for discharge.    Rehab Potential Excellent   PT Frequency --  2-3 visits total   PT  Duration 4 weeks   PT Treatment/Interventions ADLs/Self Care Home Management;DME Instruction;Therapeutic exercise;Patient/family education;Orthotic Fit/Training;Manual techniques;Manual lymph drainage;Taping   PT Next Visit Plan remeasure for volume.  Discharge next visit.    PT Home Exercise Plan given    Consulted and Agree with Plan of Care Patient      Patient will benefit from skilled therapeutic intervention in order to improve the following deficits and impairments:  Increased edema, Decreased knowledge of precautions, Decreased knowledge of use of DME  Visit Diagnosis: Lymphedema, not elsewhere classified     Problem List Patient Active Problem List   Diagnosis Date Noted  . Acute cystitis 06/28/2015  . Postmenopause atrophic vaginitis 11/23/2014  . Overweight (BMI 25.0-29.9) 07/26/2014  . Prediabetes 09/26/2012  . Seasonal allergies 04/30/2011  . Breast cancer-right T2N1. 07/29/2010  . KELOID 01/11/2009    Rayetta Humphrey, PT CLT [phone] 07/09/2015, 9:44 AM  [place], [place], [address] Phone: [phone]   Fax:  [phone]  Name: Gina Nelson MRN: [id number] Date of Birth: May 16, 1950

## 2015-07-11 ENCOUNTER — Ambulatory Visit (HOSPITAL_COMMUNITY): Payer: Medicare Other | Admitting: Physical Therapy

## 2015-07-11 DIAGNOSIS — I890 Lymphedema, not elsewhere classified: Secondary | ICD-10-CM

## 2015-07-11 NOTE — Therapy (Signed)
[place], [place], [address] Phone: [phone]   Fax:  [phone]  Physical Therapy Treatment  Patient Details  Name: LILINOE ACKLIN MRN: [id number] Date of Birth: June 14, 1950 Referring Provider: Dr. [name]  Encounter Date: 07/11/2015      PT End of Session - 07/11/15 1320    Visit Number 5   Number of Visits 5   Date for PT Re-Evaluation 07/24/15   Authorization Type UHC   Authorization - Visit Number 5   Authorization - Number of Visits 5   PT Start Time 2122   PT Stop Time 1330   PT Time Calculation (min) 25 min   Activity Tolerance Patient tolerated treatment well   Behavior During Therapy [facility] for tasks assessed/performed      Past Medical History  Diagnosis Date  . Arthritis     knee  . Blood transfusion   . Cancer [facility])     right breast T1cN2  . Allergy   . PONV (postoperative nausea and vomiting)     Past Surgical History  Procedure Laterality Date  . Total knee arthroplasty  2011  . Abdominal hysterectomy  1984  . Port-a-cath removal  07/16/10  . Colonoscopy  05/07/2011    Procedure: COLONOSCOPY;  Surgeon: Rogene Houston, MD;  [facility];  Service: Endoscopy;  Laterality: N/A;  830  . Joint replacement  11/2009    right knee  . Breast surgery  right MRM, left simple mastectomy    2012  . Breast surgery  left mastectomy    2012    There were no vitals filed for this visit.      Subjective Assessment - 07/11/15 1338    Subjective Pt states her garment has not came yet.  States she is doing well without pain.   Currently in Pain? No/denies            [facility] PT Assessment - 07/11/15 0001    Assessment   Medical Diagnosis lymphedema right arm   Referring Provider Dr. [name]   Observation/Other Assessments   Other Surveys  --  Life Impact score for Lymphedema 19           LYMPHEDEMA/ONCOLOGY QUESTIONNAIRE - 07/11/15 1311    Type   Cancer Type right breast   Right Upper Extremity Lymphedema   10 cm Proximal to Olecranon Process 30.6 cm   Olecranon Process 26.5 cm   15 cm Proximal to Ulnar Styloid Process 26.8 cm   10 cm Proximal to Ulnar Styloid Process 24 cm   Just Proximal to Ulnar Styloid Process 16.4 cm   Across Hand at [employer] 20 cm   At [place] of 2nd Digit 6.6 cm   Left Upper Extremity Lymphedema   10 cm Proximal to Olecranon Process 31 cm   Olecranon Process 25.7 cm   15 cm Proximal to Ulnar Styloid Process 25.7 cm   10 cm Proximal to Ulnar Styloid Process 22.5 cm   Just Proximal to Ulnar Styloid Process 15.4 cm   Across Hand at [employer] 19 cm   At [place] of 2nd Digit 6 cm                            PT Short Term Goals - 07/11/15 1329    PT SHORT TERM GOAL #1   Title Pt will be independent in performing self manual  drainage massage    Time 3   Period Weeks   Status Achieved   PT SHORT TERM GOAL #2   Title Pt will knoe how and where to obtain a compression garment   Time 3   Period Weeks   Status Achieved   PT SHORT TERM GOAL #3   Title Pt will be knowledgable about lymphedema risk reduction practices    Time 3   Period Weeks   Status Achieved                 Plan - 07/11/15 1426    Clinical Impression Statement Pt has met all goals for therapy and verbalizes readiness for discharge.  Offered to postpose last visit until she received garment, however patient adamant that garment fitter will address proper fit and ability to independently donn and doff garment.  Bilateral UE's remeasured today with little change noted.  Pt without questions or concerns at this point.    Rehab Potential Excellent   PT Frequency --  2-3 visits total   PT Duration 4 weeks   PT Treatment/Interventions ADLs/Self Care Home Management;DME Instruction;Therapeutic exercise;Patient/family education;Orthotic Fit/Training;Manual techniques;Manual lymph drainage;Taping   PT Next  Visit Plan Discharge per PT POC and all goals met.  pt verbalized agreement.    PT Home Exercise Plan given    Consulted and Agree with Plan of Care Patient      Patient will benefit from skilled therapeutic intervention in order to improve the following deficits and impairments:  Increased edema, Decreased knowledge of precautions, Decreased knowledge of use of DME  Visit Diagnosis: Lymphedema, not elsewhere classified     Problem List Patient Active Problem List   Diagnosis Date Noted  . Acute cystitis 06/28/2015  . Postmenopause atrophic vaginitis 11/23/2014  . Overweight (BMI 25.0-29.9) 07/26/2014  . Prediabetes 09/26/2012  . Seasonal allergies 04/30/2011  . Breast cancer-right T2N1. 07/29/2010  . KELOID 01/11/2009    Teena Irani, PTA/CLT [place], PT CLT [phone] 07/11/2015, 3:20 PM  [place], [place], [address] Phone: [phone]   Fax:  [phone]  Name: Gina Nelson MRN: [id number] Date of Birth: June 18, 1950  PHYSICAL THERAPY DISCHARGE SUMMARY  Visits from Start of Care: 5  Current functional level related to goals / functional outcomes: All goals met    Remaining deficits: none   Education / Equipment: Educated on lymphedema and self massage.  Plan: Patient agrees to discharge.  Patient goals were met. Patient is being discharged due to meeting the stated rehab goals.  ?????       Rayetta Humphrey, [place] CLT [phone]

## 2015-07-19 ENCOUNTER — Encounter (HOSPITAL_COMMUNITY): Payer: Medicare Other | Admitting: Physical Therapy

## 2015-07-23 ENCOUNTER — Encounter (HOSPITAL_COMMUNITY): Payer: Medicare Other | Admitting: Physical Therapy

## 2015-07-25 ENCOUNTER — Encounter (HOSPITAL_COMMUNITY): Payer: Medicare Other | Admitting: Physical Therapy

## 2015-07-30 ENCOUNTER — Encounter (HOSPITAL_COMMUNITY): Payer: Medicare Other | Admitting: Physical Therapy

## 2015-08-01 ENCOUNTER — Encounter (HOSPITAL_COMMUNITY): Payer: Medicare Other | Admitting: Physical Therapy

## 2015-08-13 ENCOUNTER — Other Ambulatory Visit: Payer: Self-pay | Admitting: Oncology

## 2015-08-14 NOTE — Telephone Encounter (Signed)
Chart reviewed.

## 2015-10-08 ENCOUNTER — Other Ambulatory Visit: Payer: Self-pay | Admitting: *Deleted

## 2015-10-08 DIAGNOSIS — C50319 Malignant neoplasm of lower-inner quadrant of unspecified female breast: Secondary | ICD-10-CM

## 2015-10-09 ENCOUNTER — Ambulatory Visit (HOSPITAL_BASED_OUTPATIENT_CLINIC_OR_DEPARTMENT_OTHER): Payer: Medicare Other | Admitting: Oncology

## 2015-10-09 ENCOUNTER — Other Ambulatory Visit (HOSPITAL_BASED_OUTPATIENT_CLINIC_OR_DEPARTMENT_OTHER): Payer: Medicare Other

## 2015-10-09 VITALS — BP 116/77 | HR 57 | Temp 97.9°F | Resp 17 | Ht 65.0 in | Wt 168.1 lb

## 2015-10-09 DIAGNOSIS — C773 Secondary and unspecified malignant neoplasm of axilla and upper limb lymph nodes: Secondary | ICD-10-CM | POA: Diagnosis not present

## 2015-10-09 DIAGNOSIS — C50511 Malignant neoplasm of lower-outer quadrant of right female breast: Secondary | ICD-10-CM

## 2015-10-09 DIAGNOSIS — C50911 Malignant neoplasm of unspecified site of right female breast: Secondary | ICD-10-CM | POA: Diagnosis not present

## 2015-10-09 DIAGNOSIS — C50919 Malignant neoplasm of unspecified site of unspecified female breast: Secondary | ICD-10-CM

## 2015-10-09 DIAGNOSIS — C50319 Malignant neoplasm of lower-inner quadrant of unspecified female breast: Secondary | ICD-10-CM

## 2015-10-09 LAB — CBC WITH DIFFERENTIAL/PLATELET
BASO%: 0.5 % (ref 0.0–2.0)
BASOS ABS: 0 10*3/uL (ref 0.0–0.1)
EOS ABS: 0.1 10*3/uL (ref 0.0–0.5)
EOS%: 1.7 % (ref 0.0–7.0)
HEMATOCRIT: 36.4 % (ref 34.8–46.6)
HEMOGLOBIN: 11.5 g/dL — AB (ref 11.6–15.9)
LYMPH#: 1.6 10*3/uL (ref 0.9–3.3)
LYMPH%: 20.1 % (ref 14.0–49.7)
MCH: 26.3 pg (ref 25.1–34.0)
MCHC: 31.5 g/dL (ref 31.5–36.0)
MCV: 83.5 fL (ref 79.5–101.0)
MONO#: 0.6 10*3/uL (ref 0.1–0.9)
MONO%: 7.4 % (ref 0.0–14.0)
NEUT%: 70.3 % (ref 38.4–76.8)
NEUTROS ABS: 5.8 10*3/uL (ref 1.5–6.5)
Platelets: 184 10*3/uL (ref 145–400)
RBC: 4.36 10*6/uL (ref 3.70–5.45)
RDW: 15 % — AB (ref 11.2–14.5)
WBC: 8.2 10*3/uL (ref 3.9–10.3)

## 2015-10-09 LAB — COMPREHENSIVE METABOLIC PANEL
ALBUMIN: 3.4 g/dL — AB (ref 3.5–5.0)
ALK PHOS: 94 U/L (ref 40–150)
ALT: 9 U/L (ref 0–55)
AST: 17 U/L (ref 5–34)
Anion Gap: 7 mEq/L (ref 3–11)
BILIRUBIN TOTAL: 0.39 mg/dL (ref 0.20–1.20)
BUN: 9.3 mg/dL (ref 7.0–26.0)
CALCIUM: 9.2 mg/dL (ref 8.4–10.4)
CO2: 26 mEq/L (ref 22–29)
Chloride: 110 mEq/L — ABNORMAL HIGH (ref 98–109)
Creatinine: 0.8 mg/dL (ref 0.6–1.1)
GLUCOSE: 95 mg/dL (ref 70–140)
POTASSIUM: 3.8 meq/L (ref 3.5–5.1)
Sodium: 144 mEq/L (ref 136–145)
TOTAL PROTEIN: 6.7 g/dL (ref 6.4–8.3)

## 2015-10-09 MED ORDER — TAMOXIFEN CITRATE 20 MG PO TABS: 20.0000 mg | ORAL_TABLET | Freq: Every day | ORAL | 4 refills | Status: DC

## 2015-10-09 MED ORDER — GABAPENTIN 300 MG PO CAPS: 300.0000 mg | ORAL_CAPSULE | Freq: Every day | ORAL | 4 refills | Status: DC

## 2015-10-09 NOTE — Progress Notes (Signed)
ID: Gina Nelson   DOB: [birth year]  MR#: [id number]  WLS#:[id number]  PCP: Tula Nakayama, MD GYN: SU: [name] OTHER MD:  CHIEF COMPLAINT: right breast cancer  CURRENT TREATMENT: tamoxifen daily  BREAST CANCER HISTORY: From the original intake note:  The patient herself noted a mass in her right breast.  She brought it to [name] attention and worked her up for diagnostic mammography and ultrasonography December 29.  [name] was able to palpate a discrete mass in the right breast associated with skin thickening.  By mammography, there was an obscured mass associated with distortion in that area by ultrasound.  The mass was irregular and hypoechoic measuring 1.9 cm.  In the right axilla, there was an enlarged lymph node measuring 2.1 cm.  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  This appeared to be low grade and strongly ER positive at 100%, PR was 34%, the MIB-1 was 41% and there was no evidence of HER2/neu amplification by CISH with a ratio of 1.58.  With this information, the patient was referred to [name] and bilateral breast MRIs were obtained on January 11.  The MRI found a 4.9 cm lobulated, irregular mass with spiculated margins in the right breast at the 7 o'clock location with some skin and trabecular enhancement. There was linear enhancement extending to the nipple, but no apparent extension to the pectoralis muscle.  There were two lymph nodes noted in the right axilla, one measuring 8 mm, the other 6 mm.  There was no internal mammary adenopathy noted and the left breast was unremarkable.  Her subsequent history is as detailed below  INTERVAL HISTORY: Gina Nelson returns today for follow up of her estrogen receptor positive breast cancer. She continues on  tamoxifen, which she tolerates well. She still has hot flashes. She has some night sweats. She does not have significant vaginal discharge problems. She obtains a drug at a good price.  REVIEW OF SYSTEMS: Zimal had a brief episode of right upper extremity lymphedema which resolved after about 2 days, with no pain or erythema. She is not aware of anything that may have provoked it. This has not recurred. She still feels tired relative to before her breast cancer diagnosis many years ago. Overall though a detailed review of systems today was stable   PAST MEDICAL HISTORY: Past Medical History:  Diagnosis Date  . Allergy   . Arthritis    knee  . Blood transfusion   . Cancer [facility])    right breast T1cN2  . PONV (postoperative nausea and vomiting)     PAST SURGICAL HISTORY: Past Surgical History:  Procedure Laterality Date  . ABDOMINAL HYSTERECTOMY  1984  . BREAST SURGERY  right MRM, left simple mastectomy   2012  . BREAST SURGERY  left mastectomy   2012  . COLONOSCOPY  05/07/2011   Procedure: COLONOSCOPY;  Surgeon: Rogene Houston, MD;  Location: [facility];  Service: Endoscopy;  Laterality: N/A;  830  . JOINT REPLACEMENT  11/2009   right knee  . PORT-A-CATH REMOVAL  07/16/10  . TOTAL KNEE ARTHROPLASTY  2011    FAMILY HISTORY Family History  Problem Relation Age of Onset  . Cancer Father     prostate  . Cancer Sister     pancretic   The  patient's father had prostate cancer metastatic to bone. He died at the age of 75. There is no other history of cancer in the family to the patient's knowledge.  GYNECOLOGIC HISTORY: Menarche age 65. She stopped having periods of course with her hysterectomy in 1984. She is Gx, P1. She was 18 at the time of that delivery. She used hormone replacement for about two and a half years.  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 Her husband, Gina Nelson, is a retired  Airline pilot and currently a Curator. Daughter, Gina Nelson, keeps a home for handicapped girls. Her husband, Gina Nelson, works in the Therapist, sports business. The patient has one granddaughter who is in her late teens.    ADVANCED DIRECTIVES:  HEALTH MAINTENANCE: Social History  Substance Use Topics  . Smoking status: Never Smoker  . Smokeless tobacco: Never Used  . Alcohol use No   She had a colonoscopy April 2013 which showed only a small tubular adenoma. She had a bone density September 2012 which was normal.   Allergies  Allergen Reactions  . Codeine Itching    Arms and face    Current Outpatient Prescriptions  Medication Sig Dispense Refill  . acetaminophen (TYLENOL) 500 MG tablet Take 1,000 mg by mouth every 6 (six) hours as needed for headache.    Marland Kitchen aspirin 81 MG tablet Take 81 mg by mouth daily.    . Bisacodyl (LAXATIVE PO) Take 1-2 tablets by mouth at bedtime.     . ciprofloxacin (CIPRO) 500 MG tablet Take 1 tablet (500 mg total) by mouth 2 (two) times daily. 6 tablet 0  . fluconazole (DIFLUCAN) 150 MG tablet Take 1 tablet (150 mg total) by mouth once. 1 tablet 0  . Multiple Vitamins-Minerals (MULTI-VITAMIN GUMMIES) CHEW Chew 2 tablets by mouth daily.    . tamoxifen (NOLVADEX) 20 MG tablet TAKE ONE TABLET BY MOUTH ONCE DAILY 90 tablet 0  . tamoxifen (NOLVADEX) 20 MG tablet TAKE ONE TABLET BY MOUTH ONCE DAILY 90 tablet 0   No current facility-administered medications for this visit.     OBJECTIVE: Middle-aged [place] American woman In no acute distress Vitals:   10/09/15 1028  BP: 116/77  Pulse: (!) 57  Resp: 17  Temp: 97.9 F (36.6 C)     Body mass index is 27.97 kg/m.    ECOG FS: 0  Sclerae unicteric, pupils round and equal Oropharynx clear and moist-- no thrush or other lesions No cervical or supraclavicular adenopathy Lungs no rales or rhonchi Heart regular rate and rhythm Abd soft, nontender, positive bowel sounds MSK no focal spinal  tenderness, no upper extremity lymphedema Neuro: nonfocal, well oriented, appropriate affect Breasts: Status post bilateral mastectomies. There is no evidence of chest wall recurrence. Both axillae are benign.   LAB RESULTS: Lab Results  Component Value Date   WBC 8.2 10/09/2015   NEUTROABS 5.8 10/09/2015   HGB 11.5 (L) 10/09/2015   HCT 36.4 10/09/2015   MCV 83.5 10/09/2015   PLT 184 10/09/2015      Chemistry      Component Value Date/Time   NA 144 10/09/2015 0958   K 3.8 10/09/2015 0958   CL 107 04/19/2015 0818   CL 109 (H) 11/27/2011 1454   CO2 26 10/09/2015 0958   BUN 9.3 10/09/2015 0958   CREATININE 0.8 10/09/2015 0958      Component Value Date/Time   CALCIUM 9.2 10/09/2015 0958  ALKPHOS 94 10/09/2015 0958   AST 17 10/09/2015 0958   ALT 9 10/09/2015 0958   BILITOT 0.39 10/09/2015 0958       Lab Results  Component Value Date   LABCA2 25 11/27/2011    No components found for: [id number]  No results for input(s): INR in the last 168 hours.  Urinalysis    Component Value Date/Time   COLORURINE YELLOW 11/13/2013 1430   APPEARANCEUR CLEAR 11/13/2013 1430   LABSPEC 1.010 11/13/2013 1430   PHURINE 6.5 11/13/2013 1430   GLUCOSEU NEGATIVE 11/13/2013 1430   HGBUR NEGATIVE 11/13/2013 1430   HGBUR negative 02/21/2009 1509   BILIRUBINUR negative 06/28/2015 1544   KETONESUR NEGATIVE 11/13/2013 1430   PROTEINUR negative 06/28/2015 1544   PROTEINUR NEGATIVE 11/13/2013 1430   UROBILINOGEN 1.0 06/28/2015 1544   UROBILINOGEN 0.2 11/13/2013 1430   NITRITE positive 06/28/2015 1544   NITRITE NEGATIVE 11/13/2013 1430   LEUKOCYTESUR Trace (A) 06/28/2015 1544    STUDIES: No results found.  ASSESSMENT: 65 y.o.  [place] woman status post right breast and axillary lymph node biopsy January 2012, both positive for an invasive ductal carcinoma, grade 3, measuring 4.9 cm by MRI, and so stage IIB/IIIA clinically. The tumor was ER positive, PR positive, HER2 negative, with  an elevated MIB-1.   (1) She received neoadjuvant docetaxel/ cyclophosphamide x4 followed by doxorubicin/ cyclophosphamide x1, at which point neoadjuvant treatment was discontinued because of poor tolerance  (2) she underwent right modified radical mastectomy with left prophylactic mastectomy June 2012. The left side was benign. On the right, she had 2 areas of residual tumor, one measuring 1.1 cm, the other less than a centimeter, both grade 3, involving 4 out of 13 lymph nodes sampled. Repeat HER2 was again negative, and repeat estrogen and progesterone receptors were again positive at 94% and 83%, respectively.   (3) adjuvant radiation treatments completed September of 2012, at which time she started letrozole, discontinued because of arthralgias/myalgias  (4) tamoxifen started February 2013, to be continued for a total of 10 years   PLAN:  Kaydense is now 5 years out from definitive surgery for her breast cancer with no evidence of disease recurrence. This is very favorable.  She has been on tamoxifen for 5 years. We consider discontinuing this, but she tolerates it very well and she is motivated by the few percentage points of risk reduction that she will obtain by continuing this an additional 5 years  Accordingly the plan is to continue tamoxifen for total of 10 years. She will see me again a year from now. She knows to call for any problems that may develop before her next visit here  MAGRINAT,GUSTAV C    10/09/2015

## 2015-10-15 ENCOUNTER — Encounter: Payer: Medicare Other | Admitting: Family Medicine

## 2015-10-31 IMAGING — CT CT MAXILLOFACIAL W/O CM
3 of 4 series · 16 of 47 positions shown, 19 images · non-contrast
Comparison: None.

CLINICAL DATA: Fall.

EXAM:
CT HEAD WITHOUT CONTRAST
CT MAXILLOFACIAL WITHOUT CONTRAST
TECHNIQUE: Multidetector CT imaging of the head and maxillofacial structures
were performed using the standard protocol without intravenous
contrast. Multiplanar CT image reconstructions of the maxillofacial
structures were also generated.

[Series 4: max st 2.0 h31s · axial · 0.31mm/px · z∈[+814,+958]mm · 10 of 81 slices shown, 13 images]
[im 5/81  brain]
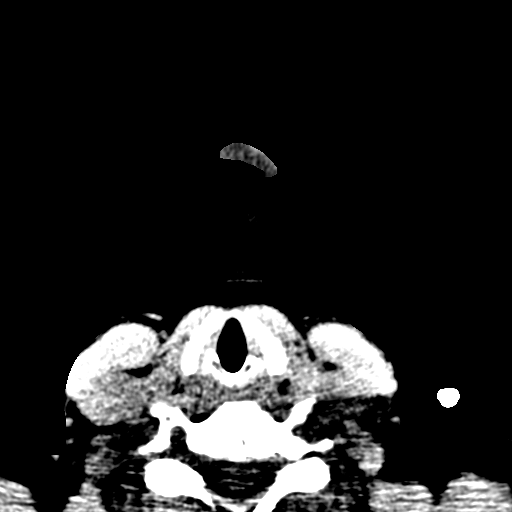
[im 5/81  bone]
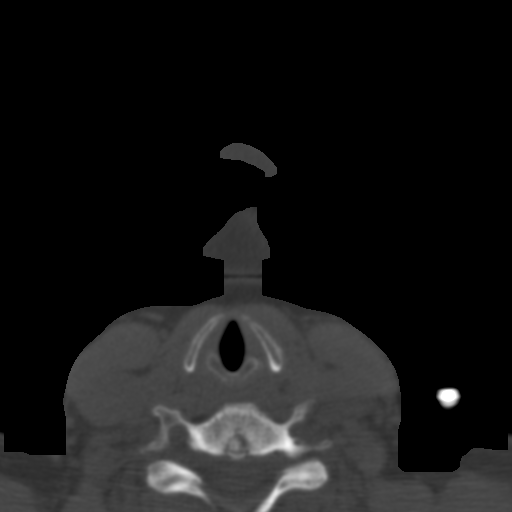
[im 13/81  bone]
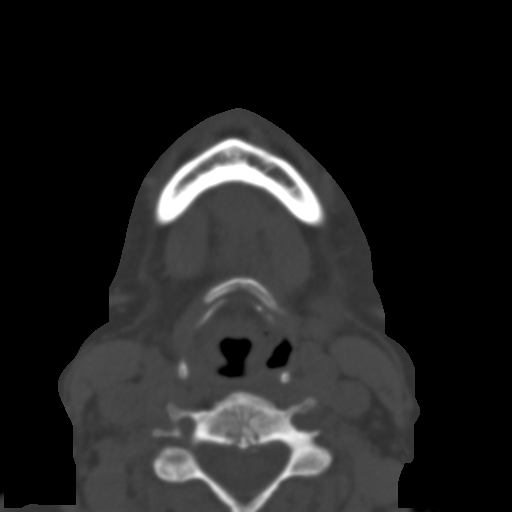
[im 21/81  bone]
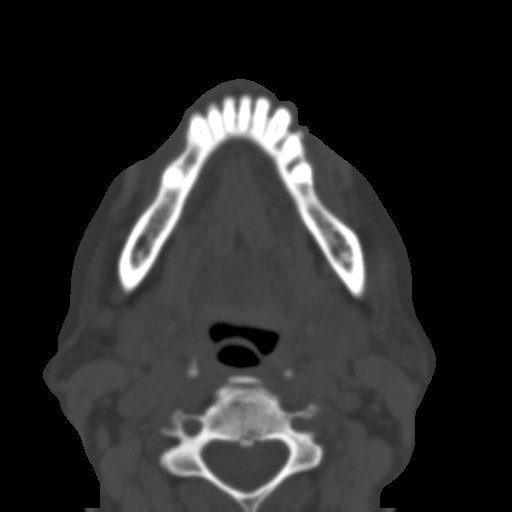
[im 29/81  bone]
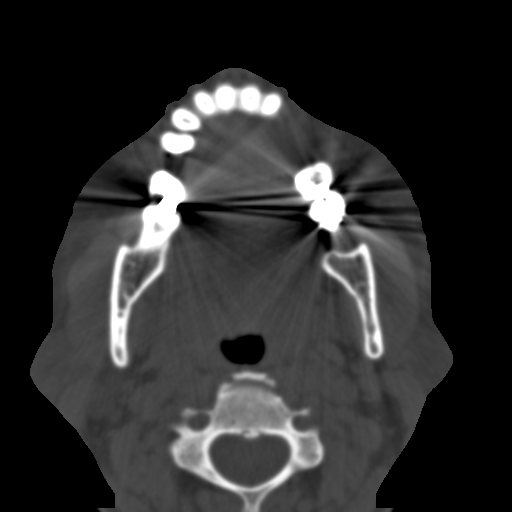
[im 37/81  brain]
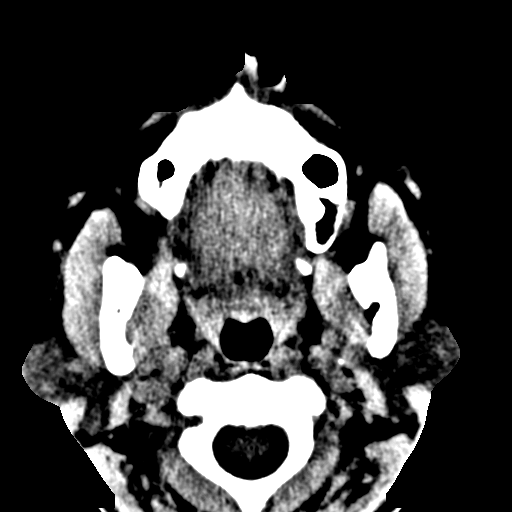
[im 37/81  bone]
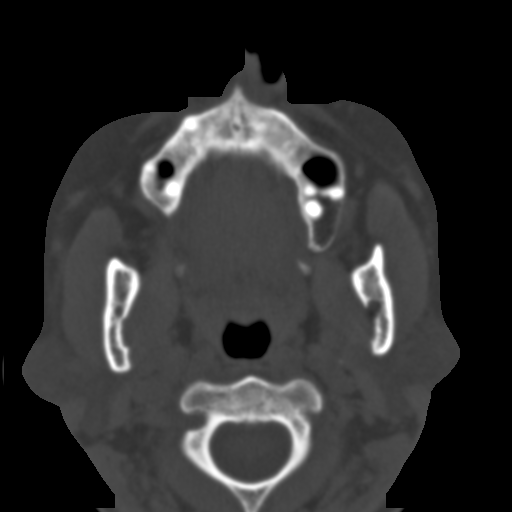
[im 45/81  bone]
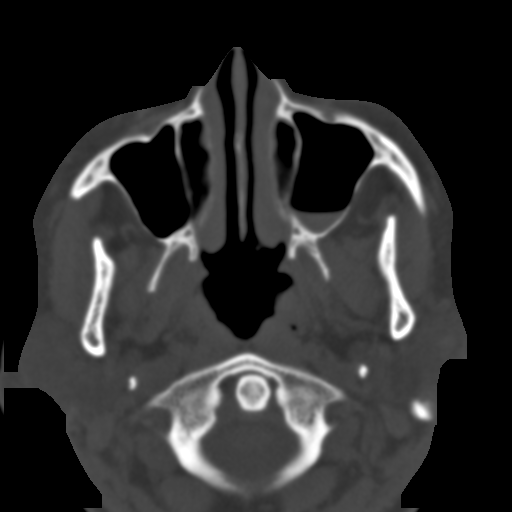
[im 53/81  bone]
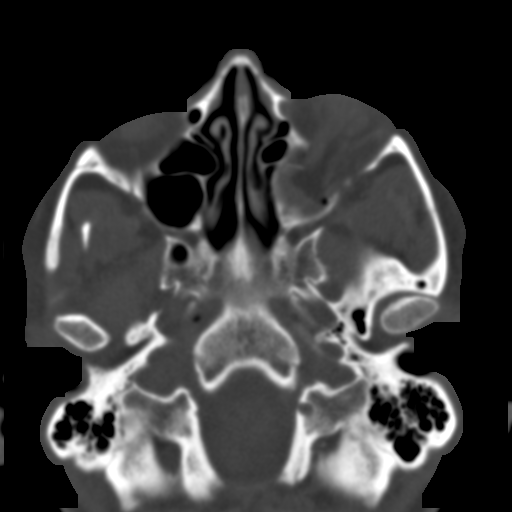
[im 61/81  bone]
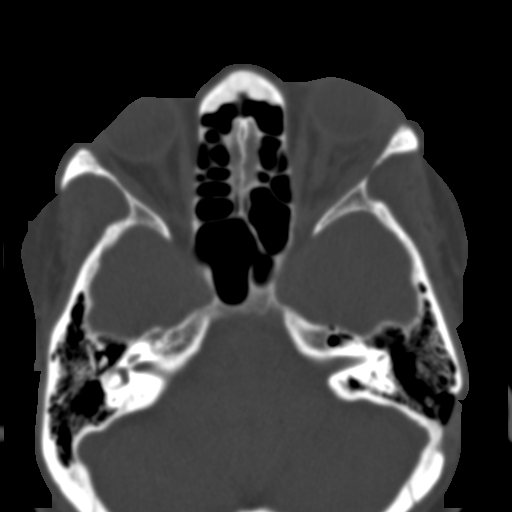
[im 69/81  brain]
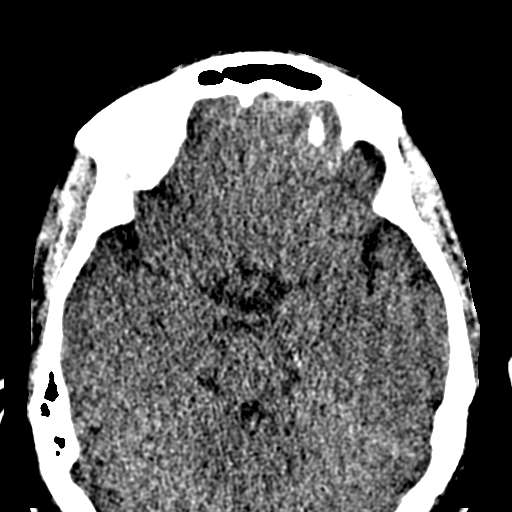
[im 69/81  bone]
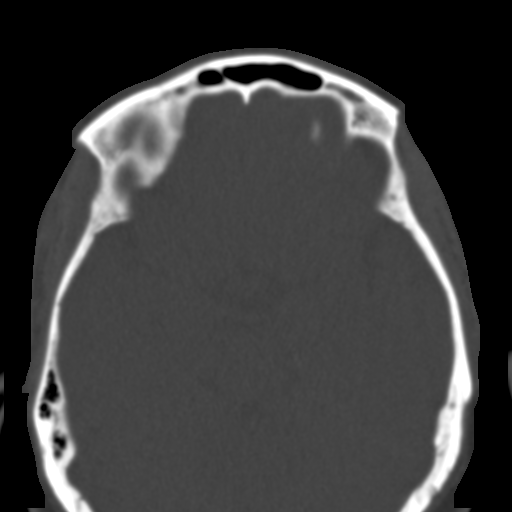
[im 77/81  bone]
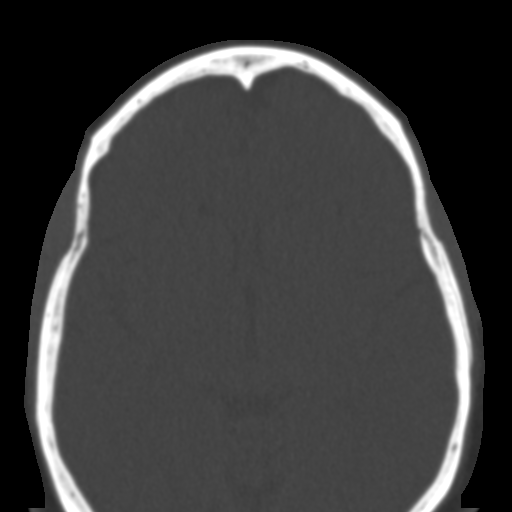

[Series 6: max st coronal · coronal · 0.32mm/px · 3 of 89 slices shown]
[im 30/89  bone]
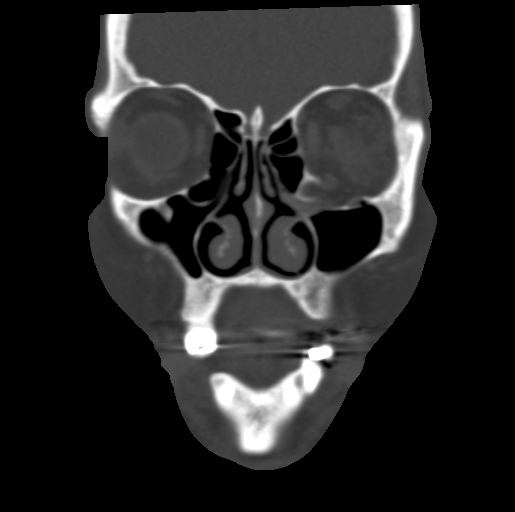
[im 40/89  bone]
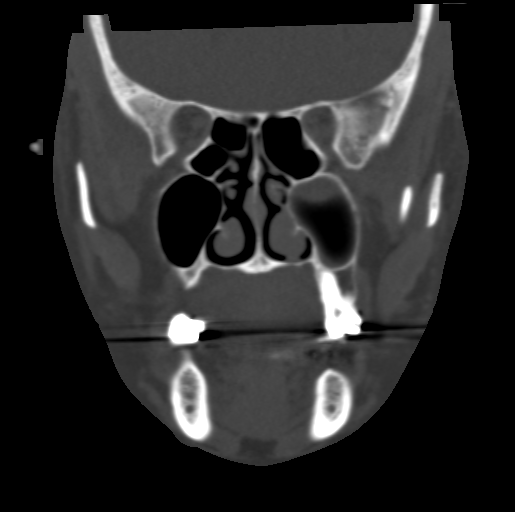
[im 49/89  bone]
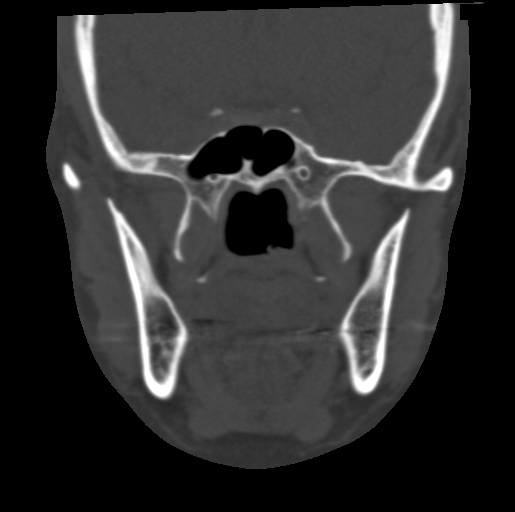

[Series 7: max st sag · sagittal · 0.32mm/px · 3 of 80 slices shown]
[im 27/80  bone]
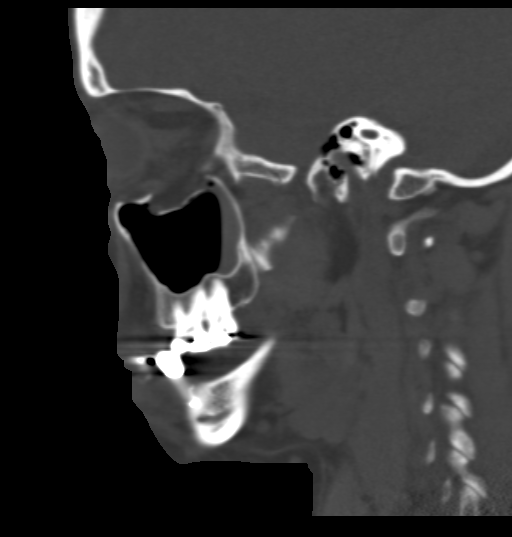
[im 40/80  bone]
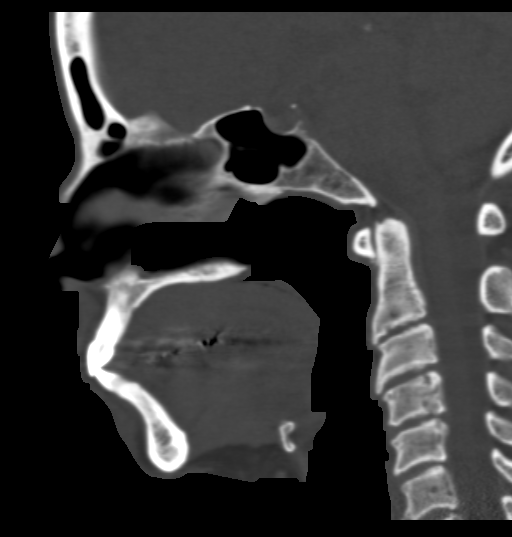
[im 53/80  bone]
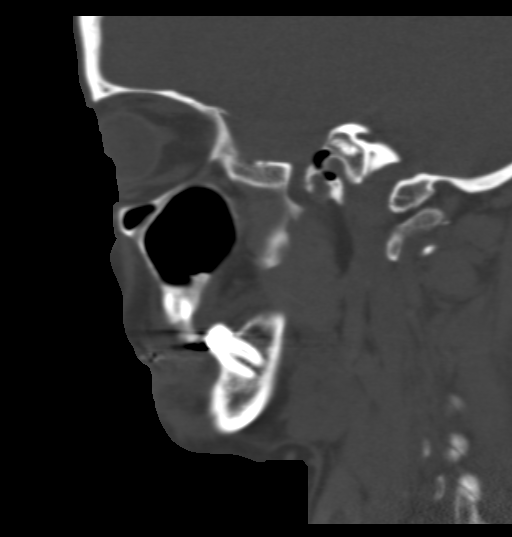

[16 of 47 positions shown; findings below may reference images not displayed]

FINDINGS: CT HEAD FINDINGS

No midline shift, ventriculomegaly, mass effect, evidence of mass
lesion, intracranial hemorrhage or evidence of cortically based
acute infarction. Gray-white matter differentiation is within normal
limits throughout the brain. A fluid level is identified within the
left maxillary sinus. There is a blow-out fracture involving the
floor of the left orbit.

CT MAXILLOFACIAL FINDINGS

Floor of left orbit fracture is identified. There is a herniation of
fat into the left maxillary sinus. A portion of the inferior rectus
muscle appears herniated through the fracture, image 33/series 6.
Hemorrhage into the left maxillary sinus is noted. The right orbit
is intact. The nasal septum appears midline. The mandible is intact.
Asymmetric left-sided preseptal soft tissue swelling is noted.
IMPRESSION: 1. No acute intracranial abnormalities.
2. Left-sided for of orbit fracture.

## 2015-11-05 ENCOUNTER — Encounter: Payer: Self-pay | Admitting: Family Medicine

## 2015-11-05 ENCOUNTER — Other Ambulatory Visit (HOSPITAL_COMMUNITY)
Admission: RE | Admit: 2015-11-05 | Discharge: 2015-11-05 | Disposition: A | Discharge status: Home / Self Care | Payer: Medicare Other | Source: Ambulatory Visit | Attending: Family Medicine | Admitting: Family Medicine

## 2015-11-05 ENCOUNTER — Ambulatory Visit (INDEPENDENT_AMBULATORY_CARE_PROVIDER_SITE_OTHER): Payer: Medicare Other | Admitting: Family Medicine

## 2015-11-05 VITALS — BP 122/70 | HR 62 | Ht 65.0 in | Wt 166.0 lb

## 2015-11-05 DIAGNOSIS — Z0000 Encounter for general adult medical examination without abnormal findings: Secondary | ICD-10-CM

## 2015-11-05 DIAGNOSIS — Z23 Encounter for immunization: Secondary | ICD-10-CM

## 2015-11-05 DIAGNOSIS — Z1211 Encounter for screening for malignant neoplasm of colon: Secondary | ICD-10-CM

## 2015-11-05 DIAGNOSIS — Z01419 Encounter for gynecological examination (general) (routine) without abnormal findings: Secondary | ICD-10-CM | POA: Diagnosis present

## 2015-11-05 DIAGNOSIS — R7303 Prediabetes: Secondary | ICD-10-CM

## 2015-11-05 DIAGNOSIS — Z124 Encounter for screening for malignant neoplasm of cervix: Secondary | ICD-10-CM

## 2015-11-05 LAB — POC HEMOCCULT BLD/STL (OFFICE/1-CARD/DIAGNOSTIC): FECAL OCCULT BLD: NEGATIVE

## 2015-11-05 MED ORDER — GABAPENTIN 100 MG PO CAPS: 100.0000 mg | ORAL_CAPSULE | Freq: Every day | ORAL | 3 refills | Status: DC

## 2015-11-05 NOTE — Assessment & Plan Note (Signed)
After obtaining informed consent, the vaccine is  administered by LPN.  

## 2015-11-05 NOTE — Patient Instructions (Addendum)
Annual wellness April 12 or after, call if you need me sooner .  Fasting lipid, tSH and vit D and HBA1C April 7 or after  Flu vaccine today  Lower dose of gabapentin for hot flashes and sleep as discussed  Please work on good  health habits so that your health will improve. 1. Commitment to daily physical activity for 30 to 60  minutes, if you are able to do this.  2. Commitment to wise food choices. Aim for half of your  food intake to be vegetable and fruit, one quarter starchy foods, and one quarter protein. Try to eat on a regular schedule  3 meals per day, snacking between meals should be limited to vegetables or fruits or small portions of nuts. 64 ounces of water per day is generally recommended, unless you have specific health conditions, like heart failure or kidney failure where you will need to limit fluid intake.  3. Commitment to sufficient and a  good quality of physical and mental rest daily, generally between 6 to 8 hours per day.  WITH PERSISTANCE AND PERSEVERANCE, THE IMPOSSIBLE , BECOMES THE NORM!  Thank you  for choosing [facility]. We consider it a privelige to serve you.  Delivering excellent health care in a caring and  compassionate way is our goal.  Partnering with you,  so that together we can achieve this goal is our strategy.

## 2015-11-05 NOTE — Progress Notes (Signed)
    Gina Nelson     MRN: [id number]      DOB: [birth year]  HPI: Patient is in for annual physical exam. No other health concerns are expressed or addressed at the visit. Recent labs, if available are reviewed. Immunization is reviewed , and  updated if needed.   PE: Pleasant  female, alert and oriented x 3, in no cardio-pulmonary distress. Afebrile. HEENT No facial trauma or asymetry. Sinuses non tender.  Extra occullar muscles intact, pupils equally reactive to light. External ears normal, tympanic membranes clear. Oropharynx moist, no exudate. Neck: supple, no adenopathy,JVD or thyromegaly.No bruits.  Chest: Clear to ascultation bilaterally.No crackles or wheezes. Non tender to palpation  Breast: No axillary or supraclavicular adenopathy Bilateral mastectomy  Cardiovascular system; Heart sounds normal,  S1 and  S2 ,no S3.  No murmur, or thrill. Apical beat not displaced Peripheral pulses normal.  Abdomen: Soft, non tender, no organomegaly or masses. No bruits. Bowel sounds normal. No guarding, tenderness or rebound.  Rectal:  Normal sphincter tone. No rectal mass. Guaiac negative stool.  GU: External genitalia normal female genitalia , normal female distribution of hair. No lesions. Urethral meatus normal in size, no  Prolapse, no lesions visibly  Present. Bladder non tender. Vagina pink and moist , with no visible lesions , discharge present . Adequate pelvic support no  cystocele or rectocele noted  Uterus absent, no adnexal masses, no  adnexal tenderness.   Musculoskeletal exam: Full ROM of spine, hips , shoulders and knees. No deformity ,swelling or crepitus noted. No muscle wasting or atrophy.   Neurologic: Cranial nerves 2 to 12 intact. Power, tone ,sensation and reflexes normal throughout. No disturbance in gait. No tremor.  Skin: Intact, no ulceration, erythema , scaling or rash noted. Pigmentation normal throughout  Psych; Normal mood  and affect. Judgement and concentration normal   Assessment & Plan:  Annual physical exam Annual exam as documented. Counseling done  re healthy lifestyle involving commitment to 150 minutes exercise per week, heart healthy diet, and attaining healthy weight.The importance of adequate sleep also discussed. Regular seat belt use and home safety, is also discussed. Changes in health habits are decided on by the patient with goals and time frames  set for achieving them. Immunization and cancer screening needs are specifically addressed at this visit.   Need for prophylactic vaccination and inoculation against influenza After obtaining informed consent, the vaccine is  administered by LPN.

## 2015-11-05 NOTE — Assessment & Plan Note (Signed)

## 2015-11-06 LAB — CYTOLOGY - PAP: DIAGNOSIS: NEGATIVE

## 2015-11-16 ENCOUNTER — Encounter: Payer: Self-pay | Admitting: Family Medicine

## 2015-11-16 ENCOUNTER — Ambulatory Visit (INDEPENDENT_AMBULATORY_CARE_PROVIDER_SITE_OTHER): Payer: Medicare Other | Admitting: Family Medicine

## 2015-11-16 VITALS — BP 120/68 | HR 68 | Temp 97.8°F | Resp 16 | Ht 65.0 in | Wt 167.0 lb

## 2015-11-16 DIAGNOSIS — N3090 Cystitis, unspecified without hematuria: Secondary | ICD-10-CM | POA: Diagnosis not present

## 2015-11-16 LAB — POCT URINALYSIS DIPSTICK
Glucose, UA: NEGATIVE
NITRITE UA: NEGATIVE
PH UA: 5.5
PROTEIN UA: 100
Spec Grav, UA: >=1.03
Urobilinogen, UA: 1

## 2015-11-16 MED ORDER — CIPROFLOXACIN HCL 500 MG PO TABS: 500.0000 mg | ORAL_TABLET | Freq: Two times a day (BID) | ORAL | 0 refills | Status: DC

## 2015-11-16 NOTE — Patient Instructions (Signed)

## 2015-11-16 NOTE — Progress Notes (Signed)
Chief Complaint  Patient presents with  . Urinary Tract Infection   Recurrent UTI since treatment for breast cancer.  Chart review indicates one to two times a year.  She has known atrophic vaginitis and is not able to use hormonal  treatment except vaginal moisturizers.  Is on tamoxifen for 10 years. currently has pressure and frequency sensation without dysuria.  The urine is dark and cloudy in appearance.  No hematuria. No vaginal discharge.  No fever or chills or malaise.  No flank pain .  No nausea or vomiting.   Patient does NOT drink enough water and this is emphasized to her.   Patient Active Problem List   Diagnosis Date Noted  . Postmenopause atrophic vaginitis 11/23/2014  . Need for prophylactic vaccination and inoculation against influenza 09/24/2014  . Overweight (BMI 25.0-29.9) 07/26/2014  . Annual physical exam 02/28/2014  . Prediabetes 09/26/2012  . Seasonal allergies 04/30/2011  . Breast cancer-right T2N1. 07/29/2010  . KELOID 01/11/2009    Outpatient Encounter Prescriptions as of 11/16/2015  Medication Sig  . acetaminophen (TYLENOL) 500 MG tablet Take 1,000 mg by mouth every 6 (six) hours as needed for headache.  Marland Kitchen aspirin 81 MG tablet Take 81 mg by mouth daily.  . Bisacodyl (LAXATIVE PO) Take 1-2 tablets by mouth at bedtime.   . gabapentin (NEURONTIN) 100 MG capsule Take 1 capsule (100 mg total) by mouth at bedtime.  . Multiple Vitamins-Minerals (MULTI-VITAMIN GUMMIES) CHEW Chew 2 tablets by mouth daily.  . tamoxifen (NOLVADEX) 20 MG tablet Take 1 tablet (20 mg total) by mouth daily.  . ciprofloxacin (CIPRO) 500 MG tablet Take 1 tablet (500 mg total) by mouth 2 (two) times daily.   No facility-administered encounter medications on file as of 11/16/2015.     Allergies  Allergen Reactions  . Codeine Itching    Arms and face    Review of Systems  Constitutional: Negative for activity change, appetite change, chills and fever.  Gastrointestinal: Positive  for abdominal pain. Negative for nausea and vomiting.       Suprapubic pressure  Genitourinary: Positive for frequency. Negative for flank pain and hematuria.  Musculoskeletal: Negative for back pain.  All other systems reviewed and are negative.   BP 120/68 (BP Location: Left Arm, Patient Position: Sitting, Cuff Size: Normal)   Pulse 68   Temp 97.8 F (36.6 C) (Oral)   Resp 16   Ht 5\' 5"  (1.651 m)   Wt 167 lb (75.8 kg)   SpO2 100%   BMI 27.79 kg/m   Physical Exam  Constitutional: She is oriented to person, place, and time. She appears well-developed and well-nourished. No distress.  HENT:  Head: Normocephalic and atraumatic.  Mouth/Throat: Oropharynx is clear and moist.  Eyes: Conjunctivae are normal. Pupils are equal, round, and reactive to light.  Cardiovascular: Normal rate, regular rhythm and normal heart sounds.   Pulmonary/Chest: Effort normal and breath sounds normal. No respiratory distress.  Abdominal: Soft. Bowel sounds are normal. There is no tenderness.  Neurological: She is alert and oriented to person, place, and time.  Psychiatric: She has a normal mood and affect. Her behavior is normal. Thought content normal.  UA is positive for blood and leuocytes  ASSESSMENT/PLAN:  1. Cystitis   - POCT urinalysis dipstick - Urine culture - ciprofloxacin (CIPRO) 500 MG tablet; Take 1 tablet (500 mg total) by mouth 2 (two) times daily.  Dispense: 10 tablet; Refill: 0   Patient Instructions    Urinary Tract  Infection A urinary tract infection (UTI) can occur any place along the urinary tract. The tract includes the kidneys, ureters, bladder, and urethra. A type of germ called bacteria often causes a UTI. UTIs are often helped with antibiotic medicine.  HOME CARE   If given, take antibiotics as told by your doctor. Finish them even if you start to feel better.  Drink enough fluids to keep your pee (urine) clear or pale yellow.  Avoid tea, drinks with caffeine, and  bubbly (carbonated) drinks.  Pee often. Avoid holding your pee in for a long time.  Pee before and after having sex (intercourse).  Wipe from front to back after you poop (bowel movement) if you are a woman. Use each tissue only once. GET HELP RIGHT AWAY IF:   You have back pain.  You have lower belly (abdominal) pain.  You have chills.  You feel sick to your stomach (nauseous).  You throw up (vomit).  Your burning or discomfort with peeing does not go away.  You have a fever.  Your symptoms are not better in 3 days. MAKE SURE YOU:   Understand these instructions.  Will watch your condition.  Will get help right away if you are not doing well or get worse.   This information is not intended to replace advice given to you by your health care provider. Make sure you discuss any questions you have with your health care provider.   Document Released: 06/18/2007 Document Revised: 01/20/2014 Document Reviewed: 07/31/2011 Elsevier Interactive Patient Education 2016 Elsevier Inc.    Raylene Everts, MD

## 2015-11-19 ENCOUNTER — Other Ambulatory Visit (HOSPITAL_COMMUNITY): Admission: RE | Admit: 2015-11-19 | Payer: Medicare Other | Source: Ambulatory Visit | Admitting: *Deleted

## 2015-12-01 ENCOUNTER — Emergency Department (HOSPITAL_COMMUNITY)
Admission: EM | Admit: 2015-12-01 | Discharge: 2015-12-01 | Disposition: A | Discharge status: Home / Self Care | Payer: Medicare Other | Attending: Emergency Medicine | Admitting: Emergency Medicine

## 2015-12-01 ENCOUNTER — Emergency Department (HOSPITAL_COMMUNITY): Payer: Medicare Other

## 2015-12-01 ENCOUNTER — Encounter (HOSPITAL_COMMUNITY): Payer: Self-pay

## 2015-12-01 DIAGNOSIS — Z853 Personal history of malignant neoplasm of breast: Secondary | ICD-10-CM | POA: Diagnosis not present

## 2015-12-01 DIAGNOSIS — Z79899 Other long term (current) drug therapy: Secondary | ICD-10-CM | POA: Diagnosis not present

## 2015-12-01 DIAGNOSIS — K5900 Constipation, unspecified: Secondary | ICD-10-CM

## 2015-12-01 DIAGNOSIS — Z7982 Long term (current) use of aspirin: Secondary | ICD-10-CM | POA: Diagnosis not present

## 2015-12-01 DIAGNOSIS — K5641 Fecal impaction: Secondary | ICD-10-CM | POA: Diagnosis not present

## 2015-12-01 LAB — POC OCCULT BLOOD, ED: Fecal Occult Bld: NEGATIVE

## 2015-12-01 MED ORDER — MAGNESIUM CITRATE PO SOLN
1.0000 | Freq: Once | ORAL | Status: AC
  Administered 2015-12-01: 1 via ORAL
  Filled 2015-12-01: qty 296

## 2015-12-01 MED ORDER — POLYETHYLENE GLYCOL 3350 17 G PO PACK: 17.0000 g | PACK | Freq: Every day | ORAL | 0 refills | Status: DC

## 2015-12-01 MED ORDER — ONDANSETRON 4 MG PO TBDP
4.0000 mg | ORAL_TABLET | Freq: Once | ORAL | Status: AC
  Administered 2015-12-01: 4 mg via ORAL
  Filled 2015-12-01: qty 1

## 2015-12-01 MED ORDER — FLEET ENEMA 7-19 GM/118ML RE ENEM
1.0000 | ENEMA | Freq: Once | RECTAL | Status: AC
  Administered 2015-12-01: 1 via RECTAL

## 2015-12-01 MED ORDER — MILK AND MOLASSES ENEMA
1.0000 | Freq: Once | RECTAL | Status: AC
  Administered 2015-12-01: 250 mL via RECTAL
  Filled 2015-12-01: qty 250

## 2015-12-01 NOTE — ED Notes (Signed)
Pt up to bathroom. States she had some success but not like she should. EDP back in to reeval

## 2015-12-01 NOTE — ED Triage Notes (Signed)
Drank 1/2 a bottle Mag Citrate two hours ago and 2 laxatives last night and two more before coming to ED

## 2015-12-01 NOTE — Discharge Instructions (Addendum)
Take the Miralax twice daily for 3 days then once daily.  Followup with Dr. [name]. Return to the ED if you develop worsening pain, vomiting, or any other concerns.

## 2015-12-01 NOTE — ED Notes (Signed)
Pt complain of nausea but no vomiting. Pt states she does not feel she can have a bowel movement but she will try. Declined bedside commode

## 2015-12-01 NOTE — ED Provider Notes (Signed)
[place] DEPT Provider Note   CSN: [id number] Arrival date & time: 12/01/15  0248     History   Chief Complaint Chief Complaint  Patient presents with  . Constipation    HPI Gina Nelson is a 65 y.o. female.  Patient presents with lower abdominal pain and constipation for the past 5 days. States she has problems with constipation and has to take laxatives on a frequent basis. Normally moves her bowels every 2 or 3 days. Feels like she is not able to move her bowels for the past 5 days if still passing gas. She occasionally notices blood in the toilet from straining. No vomiting. No fever. Good appetite. Multiple previous abdominal surgeries including hysterectomy, appendectomy, cholecystectomy. patient took a laxative tonight as well as a half a bottle of magnesium citrate without relief.   The history is provided by the patient.  Constipation   Associated symptoms include abdominal pain. Pertinent negatives include no dysuria.    Past Medical History:  Diagnosis Date  . Allergy   . Arthritis    knee  . Blood transfusion   . Cancer [facility])    right breast T1cN2  . PONV (postoperative nausea and vomiting)     Patient Active Problem List   Diagnosis Date Noted  . Postmenopause atrophic vaginitis 11/23/2014  . Need for prophylactic vaccination and inoculation against influenza 09/24/2014  . Overweight (BMI 25.0-29.9) 07/26/2014  . Annual physical exam 02/28/2014  . Prediabetes 09/26/2012  . Seasonal allergies 04/30/2011  . Breast cancer-right T2N1. 07/29/2010  . 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 ABDOMINAL HYSTERECTOMY  1984  . BREAST SURGERY  right MRM, left simple mastectomy   2012  . BREAST SURGERY  left mastectomy   2012  . COLONOSCOPY  05/07/2011   Procedure: COLONOSCOPY;  Surgeon: Rogene Houston, MD;  Location: [facility];  Service: Endoscopy;  Laterality: N/A;  830  . JOINT REPLACEMENT  11/2009   right  knee  . PORT-A-CATH REMOVAL  07/16/10  . TOTAL KNEE ARTHROPLASTY  2011    OB History    No data available       Home Medications    Prior to Admission medications   Medication Sig Start Date End Date Taking? Authorizing Provider  acetaminophen (TYLENOL) 500 MG tablet Take 1,000 mg by mouth every 6 (six) hours as needed for headache.    Historical Provider, MD  aspirin 81 MG tablet Take 81 mg by mouth daily.    Historical Provider, MD  Bisacodyl (LAXATIVE PO) Take 1-2 tablets by mouth at bedtime.     Historical Provider, MD  ciprofloxacin (CIPRO) 500 MG tablet Take 1 tablet (500 mg total) by mouth 2 (two) times daily. 11/16/15   Raylene Everts, MD  gabapentin (NEURONTIN) 100 MG capsule Take 1 capsule (100 mg total) by mouth at bedtime. 11/05/15   [name], MD  Multiple Vitamins-Minerals (MULTI-VITAMIN GUMMIES) CHEW Chew 2 tablets by mouth daily.    Historical Provider, MD  tamoxifen (NOLVADEX) 20 MG tablet Take 1 tablet (20 mg total) by mouth daily. 10/09/15   Chauncey Cruel, MD    Family History Family History  Problem Relation Age of Onset  . Cancer Father     prostate  . Cancer Sister     pancretic     Social History Social History  Substance Use Topics  . Smoking status: Never Smoker  . Smokeless tobacco: Never Used  .  Alcohol use No     Allergies   Codeine   Review of Systems Review of Systems  Constitutional: Negative for activity change and appetite change.  Gastrointestinal: Positive for abdominal pain and constipation. Negative for nausea and vomiting.  Genitourinary: Negative for dysuria, hematuria, vaginal bleeding and vaginal discharge.  Musculoskeletal: Negative for arthralgias and myalgias.  Skin: Negative for color change and rash.  Neurological: Negative for dizziness, weakness, light-headedness and headaches.  Hematological: Negative for adenopathy.   A complete 10 system review of systems was obtained and all systems are negative  except as noted in the HPI and PMH.    Physical Exam Updated Vital Signs BP 146/91 (BP Location: Left Arm)   Pulse 79   Temp 98.2 F (36.8 C) (Oral)   Resp (!) 169   Ht 5\' 5"  (1.651 m)   Wt 167 lb (75.8 kg)   SpO2 99%   BMI 27.79 kg/m   Physical Exam  Constitutional: She is oriented to person, place, and time. She appears well-developed and well-nourished. No distress.  HENT:  Head: Normocephalic and atraumatic.  Mouth/Throat: Oropharynx is clear and moist. No oropharyngeal exudate.  Eyes: Conjunctivae and EOM are normal. Pupils are equal, round, and reactive to light.  Neck: Normal range of motion. Neck supple.  No meningismus.  Cardiovascular: Normal rate, regular rhythm, normal heart sounds and intact distal pulses.   No murmur heard. Pulmonary/Chest: Effort normal and breath sounds normal. No respiratory distress.  Abdominal: Soft. There is tenderness. There is no rebound and no guarding.  Suprapubic tenderness, palpable bladder  Genitourinary:  Genitourinary Comments: Chaperone present. No external hemorrhoids or fissures. Hard stool just proximal to finger tip  Musculoskeletal: Normal range of motion. She exhibits no edema or tenderness.  Neurological: She is alert and oriented to person, place, and time. No cranial nerve deficit. She exhibits normal muscle tone. Coordination normal.   5/5 strength throughout. CN 2-12 intact.Equal grip strength.   Skin: Skin is warm.  Psychiatric: She has a normal mood and affect. Her behavior is normal.  Nursing note and vitals reviewed.    ED Treatments / Results  Labs (all labs ordered are listed, but only abnormal results are displayed) Labs Reviewed  URINALYSIS, ROUTINE W REFLEX MICROSCOPIC (NOT AT [facility]  POC OCCULT BLOOD, ED    EKG  EKG Interpretation None       Radiology No results found.  Procedures Fecal disimpaction Date/Time: 12/01/2015 6:28 AM Performed by: Ezequiel Essex Authorized by: Ezequiel Essex  Consent: Verbal consent obtained. Risks and benefits: risks, benefits and alternatives were discussed Consent given by: patient Patient understanding: patient states understanding of the procedure being performed Local anesthesia used: no  Anesthesia: Local anesthesia used: no  Sedation: Patient sedated: no Patient tolerance: Patient tolerated the procedure well with no immediate complications Comments: Stool proximal to finger tip    (including critical care time)  Medications Ordered in ED Medications - No data to display   Initial Impression / Assessment and Plan / ED Course  I have reviewed the triage vital signs and the nursing notes.  Pertinent labs & imaging results that were available during my care of the patient were reviewed by me and considered in my medical decision making (see chart for details).  Clinical Course    Constipation with lower abdominal pain.  Unable to reach stool to manually disimpact. Abdomen soft without peritoneal signs.   Bladder scan with 280 mL.  AAS shows no obstruction.  Fleets enema  given, soap suds enema, Magnesium citrate. Patient hesistant to attempt to have BM.  Patient making progress after milk and molasses enema. Zofran given for nausea.  Care transferred to Dr. Rogene Houston at shift change. Anticipate discharge home with miralax.   Final Clinical Impressions(s) / ED Diagnoses   Final diagnoses:  Constipation, unspecified constipation type  Fecal impaction [facility])    New Prescriptions New Prescriptions   No medications on file     Ezequiel Essex, MD 12/01/15 0725

## 2015-12-01 NOTE — ED Triage Notes (Signed)
Pt states she has not had a BM since Monday.

## 2016-03-18 ENCOUNTER — Telehealth: Payer: Self-pay

## 2016-03-18 ENCOUNTER — Other Ambulatory Visit: Payer: Self-pay | Admitting: Family Medicine

## 2016-03-18 MED ORDER — FLUCONAZOLE 150 MG PO TABS: ORAL_TABLET | ORAL | 0 refills | Status: DC

## 2016-03-18 NOTE — Telephone Encounter (Signed)
Med sent to [facility] pls let her know

## 2016-03-18 NOTE — Telephone Encounter (Signed)
Pt aware.

## 2016-03-18 NOTE — Telephone Encounter (Signed)
Pt called stating her husband has a yeast infection from taking abx, and she feels she has one as well, wanting to know if something can be called in for her?

## 2016-04-17 LAB — LIPID PANEL
CHOL/HDL RATIO: 2.5 ratio (ref <5.0)
Cholesterol: 159 mg/dL (ref <200)
HDL: 63 mg/dL (ref >50)
LDL CALC: 80 mg/dL (ref <100)
TRIGLYCERIDES: 79 mg/dL (ref <150)
VLDL: 16 mg/dL (ref <30)

## 2016-04-17 LAB — TSH: TSH: 1.19 mIU/L

## 2016-04-17 LAB — VITAMIN D 25 HYDROXY (VIT D DEFICIENCY, FRACTURES): Vit D, 25-Hydroxy: 26 ng/mL — ABNORMAL LOW (ref 30–100)

## 2016-04-18 ENCOUNTER — Telehealth: Payer: Self-pay | Admitting: Family Medicine

## 2016-04-18 LAB — HEMOGLOBIN A1C
Hgb A1c MFr Bld: 5.5 % (ref <5.7)
MEAN PLASMA GLUCOSE: 111 mg/dL

## 2016-04-18 NOTE — Telephone Encounter (Signed)
At her wellness next week, pls let her know I reviewed blood work and it is excellent. Needs Nov 20 ir after visit with me for physical or f/u wih rectal

## 2016-04-24 ENCOUNTER — Ambulatory Visit (INDEPENDENT_AMBULATORY_CARE_PROVIDER_SITE_OTHER): Payer: Medicare Other

## 2016-04-24 VITALS — BP 122/64 | HR 80 | Temp 98.7°F | Ht 65.0 in | Wt 164.0 lb

## 2016-04-24 DIAGNOSIS — Z0000 Encounter for general adult medical examination without abnormal findings: Secondary | ICD-10-CM | POA: Diagnosis not present

## 2016-04-24 DIAGNOSIS — Z23 Encounter for immunization: Secondary | ICD-10-CM

## 2016-04-24 NOTE — Progress Notes (Signed)
Subjective:   Gina Nelson is a 66 y.o. female who presents for an Initial Medicare Annual Wellness Visit.  Review of Systems: N/A  Cardiac Risk Factors include: advanced age (>71men, >61 women)     Objective:    Today's Vitals   04/24/16 1028  BP: 122/64  Pulse: 80  Temp: 98.7 F (37.1 C)  TempSrc: Oral  SpO2: 96%  Weight: 164 lb 0.6 oz (74.4 kg)  Height: 5\' 5"  (1.651 m)   Body mass index is 27.3 kg/m.   Current Medications (verified) Outpatient Encounter Prescriptions as of 04/24/2016  Medication Sig  . acetaminophen (TYLENOL) 500 MG tablet Take 1,000 mg by mouth every 6 (six) hours as needed for headache.  Marland Kitchen aspirin 81 MG tablet Take 81 mg by mouth daily.  . Bisacodyl (LAXATIVE PO) Take 1-2 tablets by mouth at bedtime.   . gabapentin (NEURONTIN) 100 MG capsule Take 1 capsule (100 mg total) by mouth at bedtime. (Patient taking differently: Take 100 mg by mouth daily as needed. )  . Multiple Vitamins-Minerals (MULTI-VITAMIN GUMMIES) CHEW Chew 2 tablets by mouth daily.  . tamoxifen (NOLVADEX) 20 MG tablet Take 1 tablet (20 mg total) by mouth daily.  . [DISCONTINUED] ciprofloxacin (CIPRO) 500 MG tablet Take 1 tablet (500 mg total) by mouth 2 (two) times daily.  . [DISCONTINUED] fluconazole (DIFLUCAN) 150 MG tablet One tablet one time only  . [DISCONTINUED] polyethylene glycol (MIRALAX) packet Take 17 g by mouth daily.   No facility-administered encounter medications on file as of 04/24/2016.     Allergies (verified) Codeine   History: Past Medical History:  Diagnosis Date  . Allergy   . Arthritis    knee  . Blood transfusion   . Cancer [facility])    right breast T1cN2  . PONV (postoperative nausea and vomiting)    Past Surgical History:  Procedure Laterality Date  . ABDOMINAL HYSTERECTOMY  1984  . BREAST SURGERY  right MRM, left simple mastectomy   2012  . BREAST SURGERY  left mastectomy   2012  . COLONOSCOPY  05/07/2011   Procedure: COLONOSCOPY;  Surgeon:  Rogene Houston, MD;  Location[facility];  Service: Endoscopy;  Laterality: N/A;  830  . JOINT REPLACEMENT  11/2009   right knee  . PORT-A-CATH REMOVAL  07/16/10  . TOTAL KNEE ARTHROPLASTY  2011   Family History  Problem Relation Age of Onset  . Prostate cancer Father   . Pancreatic cancer Sister   . Pneumonia Mother   . Hypertension Daughter    Social History   Occupational History  . Not on file.   Social History Main Topics  . Smoking status: Never Smoker  . Smokeless tobacco: Never Used  . Alcohol use No  . Drug use: No  . Sexual activity: Yes    Birth control/ protection: Surgical    Tobacco Counseling Counseling given: Not Answered   Activities of Daily Living In your present state of health, do you have any difficulty performing the following activities: 04/24/2016 11/16/2015  Hearing? N N  Vision? N N  Difficulty concentrating or making decisions? N N  Walking or climbing stairs? Y N  Dressing or bathing? N N  Doing errands, shopping? N N  Preparing Food and eating ? N -  Using the Toilet? N -  In the past six months, have you accidently leaked urine? N -  Do you have problems with loss of bowel control? N -  Managing your Medications? N -  Managing your Finances? N -  Housekeeping or managing your Housekeeping? N -  Some recent data might be hidden    Immunizations and Health Maintenance Immunization History  Administered Date(s) Administered  . Influenza Whole 11/08/2006  . Influenza,inj,Quad PF,36+ Mos 09/22/2012, 09/20/2013, 09/12/2014, 11/05/2015  . Pneumococcal Conjugate-13 04/24/2015  . Pneumococcal Polysaccharide-23 04/24/2016  . Td 08/06/2005  . Zoster 02/23/2012   Health Maintenance Due  Topic Date Due  . PNA vac Low Risk Adult (2 of 2 - PPSV23) 04/23/2016    Patient Care Team: [name], MD as PCP - General [name], MD as Consulting Physician (General Surgery) [name], MD as Consulting Physician  (Obstetrics and Gynecology) Chauncey Cruel, MD as Consulting Physician (Oncology) Madelin Headings, DO as Consulting Physician (Optometry)  Indicate any recent Medical Services you may have received from other than [facility] providers in the past year (date may be approximate).     Assessment:   This is a routine wellness examination for Gina Nelson.  Hearing/Vision screen  Visual Acuity Screening   Right eye Left eye Both eyes  Without correction:     With correction: 20/25 20/25 20/20     Dietary issues and exercise activities discussed: Current Exercise Habits: Home exercise routine, Type of exercise: Other - see comments (exercise bike), Time (Minutes): 15, Frequency (Times/Week): 3, Weekly Exercise (Minutes/Week): 45, Intensity: Mild  Goals    . Exercise 3x per week (30 min per time)          Recommend increasing your routine exercise program at least 5 days a week for 15 minutes at a time as tolerated.        Depression Screen PHQ 2/9 Scores 04/24/2016 11/16/2015 07/25/2014 11/24/2012  PHQ - 2 Score 0 0 0 0  PHQ- 9 Score - - 0 -    Fall Risk Fall Risk  04/24/2016 11/16/2015 04/24/2015 07/25/2014  Falls in the past year? Yes No Yes Yes  Number falls in past yr: 2 or more - 1 2 or more  Injury with Fall? No - No Yes  Risk Factor Category  - - - High Fall Risk  Risk for fall due to : History of fall(s) - - -  Follow up Falls evaluation completed;Education provided;Falls prevention discussed - - -    Cognitive Function:     6CIT Screen 04/24/2016  What Year? 0 points  What month? 0 points  What time? 0 points  Count back from 20 0 points  Months in reverse 0 points  Repeat phrase 0 points  Total Score 0    Screening Tests Health Maintenance  Topic Date Due  . PNA vac Low Risk Adult (2 of 2 - PPSV23) 04/23/2016  . TETANUS/TDAP  11/28/2016 (Originally 08/07/2015)  . MAMMOGRAM  05/30/2021 (Originally 01/18/2012)  . INFLUENZA VACCINE  08/13/2016  . COLONOSCOPY  05/06/2021  . DEXA  SCAN  Completed  . Hepatitis C Screening  Completed      Plan:  I have personally reviewed and addressed the Medicare Annual Wellness questionnaire and have noted the following in the patient's chart:  A. Medical and social history B. Use of alcohol, tobacco or illicit drugs  C. Current medications and supplements D. Functional ability and status E.  Nutritional status F.  Physical activity G. Advance directives H. List of other physicians I.  Hospitalizations, surgeries, and ER visits in previous 12 months J.  [place] to include cognitive, depression, and falls L. Referrals and appointments - none  In addition, I have reviewed and discussed with patient certain preventive protocols, quality metrics, and best practice recommendations. A written personalized care plan for preventive services as well as general preventive health recommendations were provided to patient.  Signed,   Stormy Fabian, LPN Lead Nurse Health Advisor

## 2016-04-24 NOTE — Patient Instructions (Addendum)
[name] , Thank you for taking time to come for your Medicare Wellness Visit. I appreciate your ongoing commitment to your health goals. Please review the following plan we discussed and let me know if I can assist you in the future.   Screening recommendations/referrals: Colonoscopy: Up to date, next due 04/2021 Mammogram: No longer required Bone Density: Up to date Recommended yearly ophthalmology/optometry visit for glaucoma screening and checkup Recommended yearly dental visit for hygiene and checkup  Vaccinations: Influenza vaccine: Up to date, next due 08/2016 Pneumococcal vaccine: Due for Pneumovax 23, administered today Tdap vaccine: Due now, declines today Shingles vaccine: Up to date    Advanced directives: Advance directive discussed with you today. Even though you declined this today please call our office at any time if you change your mind and we can provide you with the forms to complete.  Conditions/risks identified: Pre-obese, recommend increasing your exercise program at least 5 days a week for 15 minutes at a time as tolerated.   Next appointment: Follow up with Dr. [name] in 1 month. Follow up in 1 year for your annual wellness visit.   Preventive Care 66 Years and Older, Female Preventive care refers to lifestyle choices and visits with your health care provider that can promote health and wellness. What does preventive care include?  A yearly physical exam. This is also called an annual well check.  Dental exams once or twice a year.  Routine eye exams. Ask your health care provider how often you should have your eyes checked.  Personal lifestyle choices, including:  Daily care of your teeth and gums.  Regular physical activity.  Eating a healthy diet.  Avoiding tobacco and drug use.  Limiting alcohol use.  Practicing safe sex.  Taking low-dose aspirin every day.  Taking vitamin and mineral supplements as recommended by your health care  provider. What happens during an annual well check? The services and screenings done by your health care provider during your annual well check will depend on your age, overall health, lifestyle risk factors, and family history of disease. Counseling  Your health care provider may ask you questions about your:  Alcohol use.  Tobacco use.  Drug use.  Emotional well-being.  Home and relationship well-being.  Sexual activity.  Eating habits.  History of falls.  Memory and ability to understand (cognition).  Work and work Statistician.  Reproductive health. Screening  You may have the following tests or measurements:  Height, weight, and BMI.  Blood pressure.  Lipid and cholesterol levels. These may be checked every 5 years, or more frequently if you are over 61 years old.  Skin check.  Lung cancer screening. You may have this screening every year starting at age 66 if you have a 30-pack-year history of smoking and currently smoke or have quit within the past 15 years.  Fecal occult blood test (FOBT) of the stool. You may have this test every year starting at age 66.  Flexible sigmoidoscopy or colonoscopy. You may have a sigmoidoscopy every 5 years or a colonoscopy every 10 years starting at age 66.  Hepatitis C blood test.  Hepatitis B blood test.  Sexually transmitted disease (STD) testing.  Diabetes screening. This is done by checking your blood sugar (glucose) after you have not eaten for a while (fasting). You may have this done every 1-3 years.  Bone density scan. This is done to screen for osteoporosis. You may have this done starting at age 66.  Mammogram. This may  be done every 1-2 years. Talk to your health care provider about how often you should have regular mammograms. Talk with your health care provider about your test results, treatment options, and if necessary, the need for more tests. Vaccines  Your health care provider may recommend certain  vaccines, such as:  Influenza vaccine. This is recommended every year.  Tetanus, diphtheria, and acellular pertussis (Tdap, Td) vaccine. You may need a Td booster every 10 years.  Zoster vaccine. You may need this after age 66.  Pneumococcal 13-valent conjugate (PCV13) vaccine. One dose is recommended after age 66.  Pneumococcal polysaccharide (PPSV23) vaccine. One dose is recommended after age 66. Talk to your health care provider about which screenings and vaccines you need and how often you need them. This information is not intended to replace advice given to you by your health care provider. Make sure you discuss any questions you have with your health care provider. Document Released: 01/26/2015 Document Revised: 09/19/2015 Document Reviewed: 10/31/2014 Elsevier Interactive Patient Education  2017 [name] Prevention in the Home Falls can cause injuries. They can happen to people of all ages. There are many things you can do to make your home safe and to help prevent falls. What can I do on the outside of my home?  Regularly fix the edges of walkways and driveways and fix any cracks.  Remove anything that might make you trip as you walk through a door, such as a raised step or threshold.  Trim any bushes or trees on the path to your home.  Use bright outdoor lighting.  Clear any walking paths of anything that might make someone trip, such as rocks or tools.  Regularly check to see if handrails are loose or broken. Make sure that both sides of any steps have handrails.  Any raised decks and porches should have guardrails on the edges.  Have any leaves, snow, or ice cleared regularly.  Use sand or salt on walking paths during winter.  Clean up any spills in your garage right away. This includes oil or grease spills. What can I do in the bathroom?  Use night lights.  Install grab bars by the toilet and in the tub and shower. Do not use towel bars as grab  bars.  Use non-skid mats or decals in the tub or shower.  If you need to sit down in the shower, use a plastic, non-slip stool.  Keep the floor dry. Clean up any water that spills on the floor as soon as it happens.  Remove soap buildup in the tub or shower regularly.  Attach bath mats securely with double-sided non-slip rug tape.  Do not have throw rugs and other things on the floor that can make you trip. What can I do in the bedroom?  Use night lights.  Make sure that you have a light by your bed that is easy to reach.  Do not use any sheets or blankets that are too big for your bed. They should not hang down onto the floor.  Have a firm chair that has side arms. You can use this for support while you get dressed.  Do not have throw rugs and other things on the floor that can make you trip. What can I do in the kitchen?  Clean up any spills right away.  Avoid walking on wet floors.  Keep items that you use a lot in easy-to-reach places.  If you need to reach something above you,  use a strong step stool that has a grab bar.  Keep electrical cords out of the way.  Do not use floor polish or wax that makes floors slippery. If you must use wax, use non-skid floor wax.  Do not have throw rugs and other things on the floor that can make you trip. What can I do with my stairs?  Do not leave any items on the stairs.  Make sure that there are handrails on both sides of the stairs and use them. Fix handrails that are broken or loose. Make sure that handrails are as long as the stairways.  Check any carpeting to make sure that it is firmly attached to the stairs. Fix any carpet that is loose or worn.  Avoid having throw rugs at the top or bottom of the stairs. If you do have throw rugs, attach them to the floor with carpet tape.  Make sure that you have a light switch at the top of the stairs and the bottom of the stairs. If you do not have them, ask someone to add them for  you. What else can I do to help prevent falls?  Wear shoes that:  Do not have high heels.  Have rubber bottoms.  Are comfortable and fit you well.  Are closed at the toe. Do not wear sandals.  If you use a stepladder:  Make sure that it is fully opened. Do not climb a closed stepladder.  Make sure that both sides of the stepladder are locked into place.  Ask someone to hold it for you, if possible.  Clearly mark and make sure that you can see:  Any grab bars or handrails.  First and last steps.  Where the edge of each step is.  Use tools that help you move around (mobility aids) if they are needed. These include:  Canes.  Walkers.  Scooters.  Crutches.  Turn on the lights when you go into a dark area. Replace any light bulbs as soon as they burn out.  Set up your furniture so you have a clear path. Avoid moving your furniture around.  If any of your floors are uneven, fix them.  If there are any pets around you, be aware of where they are.  Review your medicines with your doctor. Some medicines can make you feel dizzy. This can increase your chance of falling. Ask your doctor what other things that you can do to help prevent falls. This information is not intended to replace advice given to you by your health care provider. Make sure you discuss any questions you have with your health care provider. Document Released: 10/26/2008 Document Revised: 06/07/2015 Document Reviewed: 02/03/2014 Elsevier Interactive Patient Education  2017 [employer].

## 2016-06-05 ENCOUNTER — Ambulatory Visit: Payer: Medicare Other | Admitting: Family Medicine

## 2016-06-16 ENCOUNTER — Ambulatory Visit (INDEPENDENT_AMBULATORY_CARE_PROVIDER_SITE_OTHER): Payer: Medicare Other | Admitting: Family Medicine

## 2016-06-16 ENCOUNTER — Telehealth: Payer: Self-pay | Admitting: Family Medicine

## 2016-06-16 VITALS — BP 120/74 | HR 80 | Resp 16 | Ht 65.0 in | Wt 164.4 lb

## 2016-06-16 DIAGNOSIS — N3001 Acute cystitis with hematuria: Secondary | ICD-10-CM

## 2016-06-16 DIAGNOSIS — E663 Overweight: Secondary | ICD-10-CM | POA: Diagnosis not present

## 2016-06-16 LAB — POCT URINALYSIS DIPSTICK
Bilirubin, UA: NEGATIVE
Glucose, UA: NEGATIVE
KETONES UA: NEGATIVE
NITRITE UA: NEGATIVE
PH UA: 5.5 (ref 5.0–8.0)
PROTEIN UA: NEGATIVE
Spec Grav, UA: >=1.03 — AB (ref 1.010–1.025)
UROBILINOGEN UA: 0.2 U/dL

## 2016-06-16 MED ORDER — CIPROFLOXACIN HCL 500 MG PO TABS: 500.0000 mg | ORAL_TABLET | Freq: Two times a day (BID) | ORAL | 0 refills | Status: DC

## 2016-06-16 MED ORDER — FLUCONAZOLE 150 MG PO TABS: ORAL_TABLET | ORAL | 0 refills | Status: DC

## 2016-06-16 NOTE — Telephone Encounter (Signed)
Work in today asap pls

## 2016-06-16 NOTE — Telephone Encounter (Signed)
Patient thinks she may have a UTI, she states no pain/burning, just pressure when she urinates and she cant hold it " when I gotta go, I gotta go"  She wants to know if she needs to make an appt or if dr.simpson will call something into her pharmacy. Cb#: [phone]

## 2016-06-16 NOTE — Patient Instructions (Addendum)
F/ U for physical as before, call if you need me sooner   You are treated for acute bladder infection, 3 days of antibiotics are prescribed, as well as one fluconazole prescribed for use after the antibiotic in case you get yeast infection after the antibiotic  Ensure you drink 64 ounces water daily and void often  Thank you  for choosing [facility]. We consider it a privelige to serve you.  Delivering excellent health care in a caring and  compassionate way is our goal.  Partnering with you,  so that together we can achieve this goal is our strategy.

## 2016-06-16 NOTE — Telephone Encounter (Signed)
No pain per patient- just pressure like she has to urinate right then, cant hold it. When she has to go she has to go. Does she need an appt or nurse visit for uti check? Please advise

## 2016-06-16 NOTE — Assessment & Plan Note (Addendum)
3 day history, no flank pain , fever or chills, 3 days of cipro and c/s, abnormal UA

## 2016-06-17 ENCOUNTER — Other Ambulatory Visit (HOSPITAL_COMMUNITY)
Admission: RE | Admit: 2016-06-17 | Discharge: 2016-06-17 | Disposition: A | Discharge status: Home / Self Care | Payer: Medicare Other | Source: Other Acute Inpatient Hospital | Attending: Family Medicine | Admitting: Family Medicine

## 2016-06-17 ENCOUNTER — Encounter: Payer: Self-pay | Admitting: Family Medicine

## 2016-06-17 DIAGNOSIS — N3001 Acute cystitis with hematuria: Secondary | ICD-10-CM | POA: Insufficient documentation

## 2016-06-17 NOTE — Progress Notes (Signed)
   Gina Nelson     MRN: [id number]      DOB: 05-19-1950   HPI Gina Nelson is here with a 3 day h/o pelvic pressure and urinary frequency, she denies fever , chi;lls or flank pain, she has no visible;le hematuria, has had UTI in the past, and reports sexual activity the day before symptom onset, actually remains very sexually active ROS See HPI  Denies recent fever or chills. Denies sinus pressure, nasal congestion, ear pain or sore throat. Denies chest congestion, productive cough or wheezing. Denies chest pains, palpitations and leg swelling Denies abdominal pain, nausea, vomiting,diarrhea or constipation.      PE  BP 120/74   Pulse 80   Resp 16   Ht 5\' 5"  (1.651 m)   Wt 164 lb 6.4 oz (74.6 kg)   SpO2 95%   BMI 27.36 kg/m   Patient alert and oriented and in no cardiopulmonary distress.  HEENT: No facial asymmetry, EOMI,   oropharynx pink and moist.  Neck supple no JVD, no mass.  Chest: Clear to auscultation bilaterally.  CVS: S1, S2 no murmurs, no S3.Regular rate.  ABD: Soft no supra pubic or renal angle tenderness  Ext: No edema  MS: Adequate ROM spine, shoulders, hips and knees.     Assessment & Plan  Acute cystitis with hematuria 3 day history, no flank pain , fever or chills, 3 days of cipro and c/s, abnormal UA  Overweight (BMI 25.0-29.9) Unchnaged Patient re-educated about  the importance of commitment to a  minimum of 150 minutes of exercise per week.  The importance of healthy food choices with portion control discussed. Encouraged to start a food diary, count calories and to consider  joining a support group. Sample diet sheets offered. Goals set by the patient for the next several months.   Weight /BMI 06/16/2016 04/24/2016 12/01/2015  WEIGHT 164 lb 6.4 oz 164 lb 0.6 oz 167 lb  HEIGHT 5\' 5"  5\' 5"  5\' 5"   BMI 27.36 kg/m2 27.3 kg/m2 27.79 kg/m2

## 2016-06-17 NOTE — Assessment & Plan Note (Signed)
Unchnaged Patient re-educated about  the importance of commitment to a  minimum of 150 minutes of exercise per week.  The importance of healthy food choices with portion control discussed. Encouraged to start a food diary, count calories and to consider  joining a support group. Sample diet sheets offered. Goals set by the patient for the next several months.   Weight /BMI 06/16/2016 04/24/2016 12/01/2015  WEIGHT 164 lb 6.4 oz 164 lb 0.6 oz 167 lb  HEIGHT 5\' 5"  5\' 5"  5\' 5"   BMI 27.36 kg/m2 27.3 kg/m2 27.79 kg/m2

## 2016-06-19 LAB — URINE CULTURE: Culture: 30000 — AB

## 2016-07-21 ENCOUNTER — Telehealth: Payer: Self-pay | Admitting: Family Medicine

## 2016-07-21 NOTE — Telephone Encounter (Signed)
New Message  Pt voiced she has UTI and wants to know if she needs to come in or if we can send a prescription to pharmacist.  Please f/u

## 2016-07-22 ENCOUNTER — Ambulatory Visit (INDEPENDENT_AMBULATORY_CARE_PROVIDER_SITE_OTHER): Payer: Medicare Other

## 2016-07-22 DIAGNOSIS — N3001 Acute cystitis with hematuria: Secondary | ICD-10-CM

## 2016-07-22 LAB — POCT URINALYSIS DIPSTICK
Glucose, UA: NEGATIVE
Nitrite, UA: NEGATIVE
PH UA: 5.5 (ref 5.0–8.0)
PROTEIN UA: 100
UROBILINOGEN UA: 0.2 U/dL

## 2016-07-22 MED ORDER — CIPROFLOXACIN HCL 500 MG PO TABS: 500.0000 mg | ORAL_TABLET | Freq: Two times a day (BID) | ORAL | 0 refills | Status: DC

## 2016-07-22 NOTE — Telephone Encounter (Signed)
Called and left message to get her symptoms. May need to come in for nurse visit

## 2016-07-22 NOTE — Telephone Encounter (Signed)
Send Cipro 500 BID for 3 days to pharmacy of choice

## 2016-07-22 NOTE — Progress Notes (Signed)
UA showed trace of leukocytes. Will send for culture and advise patient the dr will await results and to increase water intake in the meantime

## 2016-07-22 NOTE — Telephone Encounter (Signed)
Came in for nurse visit and UA showed small blood and trace leukocytes and I advised the pt urine would be sent for culture and she states she wants something for relief now because she is having a lot of pressure when she urinates and its very uncomfortable. Please advise

## 2016-07-22 NOTE — Telephone Encounter (Signed)
Patient informed of message below, verbalized understanding.  Wanted med sent to [facility].   Medication sent to pharmacy

## 2016-07-22 NOTE — Telephone Encounter (Signed)
Called patient regarding message below. No answer, left generic message for patient to return call.   

## 2016-07-25 LAB — URINE CULTURE

## 2016-07-27 ENCOUNTER — Telehealth: Payer: Self-pay

## 2016-07-27 NOTE — Telephone Encounter (Signed)
Spoke with patient and she is aware of her new appts  Gina Nelson 

## 2016-09-29 ENCOUNTER — Other Ambulatory Visit: Payer: Self-pay | Admitting: Family Medicine

## 2016-09-29 ENCOUNTER — Telehealth: Payer: Self-pay | Admitting: Family Medicine

## 2016-09-29 MED ORDER — CIPROFLOXACIN HCL 500 MG PO TABS: 500.0000 mg | ORAL_TABLET | Freq: Two times a day (BID) | ORAL | 0 refills | Status: DC

## 2016-09-29 NOTE — Telephone Encounter (Signed)
Called patient regarding message below. No answer, left generic message for patient to return call.   

## 2016-09-29 NOTE — Telephone Encounter (Signed)
Patient thinks she's got a UTI and noticed this morning.  She has low output and pressure.  She states she's had something called in before-uses [facility]. She has another appt out of town, but could do a urine sample tomorrow if required, otherwise she is ok with whatever she took the last time that cleared it up.    [phone] Or cell [phone]

## 2016-09-29 NOTE — Telephone Encounter (Signed)
cipro 500 mg #6 tabs sent to her local pharmacy pls k;let her know

## 2016-10-07 ENCOUNTER — Other Ambulatory Visit: Payer: Medicare Other

## 2016-10-07 ENCOUNTER — Ambulatory Visit: Payer: Medicare Other | Admitting: Oncology

## 2016-10-07 ENCOUNTER — Ambulatory Visit (INDEPENDENT_AMBULATORY_CARE_PROVIDER_SITE_OTHER): Payer: Medicare Other

## 2016-10-07 DIAGNOSIS — Z23 Encounter for immunization: Secondary | ICD-10-CM | POA: Diagnosis not present

## 2016-10-16 ENCOUNTER — Ambulatory Visit (HOSPITAL_COMMUNITY)
Admission: RE | Admit: 2016-10-16 | Discharge: 2016-10-16 | Disposition: A | Discharge status: Home / Self Care | Payer: Medicare Other | Source: Ambulatory Visit | Attending: Family Medicine | Admitting: Family Medicine

## 2016-10-16 ENCOUNTER — Ambulatory Visit (INDEPENDENT_AMBULATORY_CARE_PROVIDER_SITE_OTHER): Payer: Medicare Other | Admitting: Family Medicine

## 2016-10-16 ENCOUNTER — Encounter: Payer: Self-pay | Admitting: Family Medicine

## 2016-10-16 VITALS — BP 126/72 | HR 63 | Temp 98.4°F | Resp 16 | Ht 65.0 in | Wt 160.0 lb

## 2016-10-16 DIAGNOSIS — R630 Anorexia: Secondary | ICD-10-CM | POA: Insufficient documentation

## 2016-10-16 DIAGNOSIS — E663 Overweight: Secondary | ICD-10-CM

## 2016-10-16 DIAGNOSIS — R1013 Epigastric pain: Secondary | ICD-10-CM | POA: Insufficient documentation

## 2016-10-16 DIAGNOSIS — R12 Heartburn: Secondary | ICD-10-CM | POA: Diagnosis present

## 2016-10-16 DIAGNOSIS — E041 Nontoxic single thyroid nodule: Secondary | ICD-10-CM

## 2016-10-16 DIAGNOSIS — Z853 Personal history of malignant neoplasm of breast: Secondary | ICD-10-CM | POA: Insufficient documentation

## 2016-10-16 DIAGNOSIS — R7303 Prediabetes: Secondary | ICD-10-CM | POA: Diagnosis not present

## 2016-10-16 NOTE — Patient Instructions (Addendum)
F/u early January, call if you need me sooner    Breath test today   CBC, cmp, today  Ypou are referred for [place] of thyroid gland due to nodule  CXR today

## 2016-10-17 LAB — COMPLETE METABOLIC PANEL WITH GFR
AG Ratio: 1.5 (calc) (ref 1.0–2.5)
ALKALINE PHOSPHATASE (APISO): 87 U/L (ref 33–130)
ALT: 7 U/L (ref 6–29)
AST: 18 U/L (ref 10–35)
Albumin: 4.1 g/dL (ref 3.6–5.1)
BILIRUBIN TOTAL: 0.6 mg/dL (ref 0.2–1.2)
BUN / CREAT RATIO: 6 (calc) (ref 6–22)
BUN: 7 mg/dL (ref 7–25)
CO2: 27 mmol/L (ref 20–32)
Calcium: 9.4 mg/dL (ref 8.6–10.4)
Chloride: 106 mmol/L (ref 98–110)
Creat: 1.16 mg/dL — ABNORMAL HIGH (ref 0.50–0.99)
GFR, Est African American: 57 mL/min/{1.73_m2} — ABNORMAL LOW (ref >=60)
GFR, Est Non African American: 49 mL/min/{1.73_m2} — ABNORMAL LOW (ref >=60)
GLOBULIN: 2.8 g/dL (ref 1.9–3.7)
Glucose, Bld: 84 mg/dL (ref 65–99)
POTASSIUM: 3.5 mmol/L (ref 3.5–5.3)
SODIUM: 144 mmol/L (ref 135–146)
Total Protein: 6.9 g/dL (ref 6.1–8.1)

## 2016-10-17 LAB — CBC
HCT: 36.4 % (ref 35.0–45.0)
Hemoglobin: 11.6 g/dL — ABNORMAL LOW (ref 11.7–15.5)
MCH: 26.7 pg — AB (ref 27.0–33.0)
MCHC: 31.9 g/dL — AB (ref 32.0–36.0)
MCV: 83.7 fL (ref 80.0–100.0)
MPV: 12.7 fL — AB (ref 7.5–12.5)
PLATELETS: 207 10*3/uL (ref 140–400)
RBC: 4.35 10*6/uL (ref 3.80–5.10)
RDW: 13.4 % (ref 11.0–15.0)
WBC: 7.1 10*3/uL (ref 3.8–10.8)

## 2016-10-17 LAB — H. PYLORI BREATH TEST: H. PYLORI BREATH TEST: NOT DETECTED

## 2016-10-20 ENCOUNTER — Encounter: Payer: Self-pay | Admitting: Family Medicine

## 2016-10-23 ENCOUNTER — Telehealth: Payer: Self-pay | Admitting: Family Medicine

## 2016-10-23 DIAGNOSIS — E041 Nontoxic single thyroid nodule: Secondary | ICD-10-CM | POA: Insufficient documentation

## 2016-10-23 NOTE — Telephone Encounter (Signed)
Patient informed of message below, verbalized understanding.  

## 2016-10-23 NOTE — Telephone Encounter (Signed)
I have ordered the ultrasound , she is aware, please try and get this scheduled asap, thanks I have also referred her to GI and sent dr a Theora Gianotti, she is aware

## 2016-10-23 NOTE — Assessment & Plan Note (Signed)
2 week h/o dyspepsia, bloating , poor appetite and early satiety, pt concerned about weight loss and feels as though "something is not right'  She has breast cancer history. She has had a cholecystectomy. Refer to dr [name] / NP for further eval, Breath test is negative Weight loss that I see is not very significant, 4 pounds in the past 6 months, but she does have concerning symptoms and is herself very concerned

## 2016-10-23 NOTE — Assessment & Plan Note (Signed)
Small nodule noted on imaging study several years ago,  and does "not feel well , will order thyroid US for dedicated study of  Gland, of note her thyroid functio is normal

## 2016-10-23 NOTE — Progress Notes (Signed)
   ASHIKA APUZZO     MRN: [id number]      DOB: [birth year]   HPI Gina Nelson is here with  Concern of poor appetite , bloating and weight los over the past 2 weeks, states she just does not feel herself and "knows that something is wrong." States that as soon as she starts eating , she gets very full and as a result she has been losing weight and she is very concerned about this  ROS Denies recent fever or chills.c/o fatigue and feels as though"someting is desperately wrong with her" Denies sinus pressure, nasal congestion, ear pain or sore throat. Denies chest congestion, productive cough or wheezing. Denies chest pains, palpitations and leg swelling Denies change in bM Denies joint pain, swelling and limitation in mobility. Denies headaches, seizures, numbness, or tingling. Denies depression, anxiety or insomnia. Denies skin break down or rash.   PE  BP 126/72 (BP Location: Left Arm, Patient Position: Sitting, Cuff Size: Normal)   Pulse 63   Temp 98.4 F (36.9 C) (Other (Comment))   Resp 16   Ht 5\' 5"  (1.651 m)   Wt 160 lb (72.6 kg)   SpO2 98%   BMI 26.63 kg/m   Patient alert and oriented and in no cardiopulmonary distress.  HEENT: No facial asymmetry, EOMI,   oropharynx pink and moist.  Neck supple no JVD, no mass.  Chest: Clear to auscultation bilaterally.  CVS: S1, S2 no murmurs, no S3.Regular rate.  ABD: Soft non tender. No palpable organomegaly or mass , normal bS  Ext: No edema  MS: Adequate ROM spine, shoulders, hips and knees.  Skin: Intact, no ulcerations or rash noted.  Psych: Good eye contact, normal affect. Memory intact not anxious or depressed appearing.  CNS: CN 2-12 intact, power,  normal throughout.no focal deficits noted.   Assessment & Plan  Right thyroid nodule Small nodule noted on imaging study several years ago,  and does "not feel well , will order thyroid US for dedicated study of  Gland, of note her thyroid functio is  normal  Dyspepsia 2 week h/o dyspepsia, bloating , poor appetite and early satiety, pt concerned about weight loss and feels as though "something is not right'  She has breast cancer history. She has had a cholecystectomy. Refer to dr [name] / NP for further eval, Breath test is negative Weight loss that I see is not very significant, 4 pounds in the past 6 months, but she does have concerning symptoms and is herself very concerned  Anorexia 2 week h/o anorexia, early satiety and unintentional weight loss, pt extremely concerned about potential  significant underlying  Pathology, will refer to GI  For assistance with eval and mangement

## 2016-10-23 NOTE — Assessment & Plan Note (Signed)
2 week h/o anorexia, early satiety and unintentional weight loss, pt extremely concerned about potential  significant underlying  Pathology, will refer to GI  For assistance with eval and mangement

## 2016-10-23 NOTE — Telephone Encounter (Signed)
Patient calling to find out when she is to go for the Ultrasound. She has already had her chest xray and finished her blood work.  Also she is asking about the referral to Dr [name] for endoscopy.

## 2016-10-29 ENCOUNTER — Other Ambulatory Visit (INDEPENDENT_AMBULATORY_CARE_PROVIDER_SITE_OTHER): Payer: Self-pay | Admitting: Internal Medicine

## 2016-10-29 ENCOUNTER — Ambulatory Visit (INDEPENDENT_AMBULATORY_CARE_PROVIDER_SITE_OTHER): Payer: Medicare Other | Admitting: Internal Medicine

## 2016-10-29 ENCOUNTER — Encounter (INDEPENDENT_AMBULATORY_CARE_PROVIDER_SITE_OTHER): Payer: Self-pay | Admitting: *Deleted

## 2016-10-29 ENCOUNTER — Encounter (INDEPENDENT_AMBULATORY_CARE_PROVIDER_SITE_OTHER): Payer: Self-pay | Admitting: Internal Medicine

## 2016-10-29 VITALS — BP 136/70 | HR 72 | Temp 98.4°F | Ht 65.0 in | Wt 157.2 lb

## 2016-10-29 DIAGNOSIS — R6881 Early satiety: Secondary | ICD-10-CM

## 2016-10-29 DIAGNOSIS — F5000 Anorexia nervosa, unspecified: Secondary | ICD-10-CM

## 2016-10-29 NOTE — Patient Instructions (Signed)
EGD. The risks of bleeding, perforation and infection were reviewed with patient.  

## 2016-10-29 NOTE — Progress Notes (Signed)
Subjective:    Patient ID: Gina Nelson, female    DOB: 1950/02/03, 66 y.o.   MRN: [id number]  HPI Referred by Dr. [name] for heart burn, anorexia.  Has lost 4 pounds over the past 6 months.  Hx of breast cancer in January of 2012.  Bilateral mastectomy. (Cancer in rt breast). Wt 10/16/2016 at Dr. [name] was 160. Today her weight is 157.2. She does not have an appetite. She will eat and then feel like she is full. She says she has never felt like this before.  She says when she swallows if feels like foods are very slow to go down.  She sometimes will belch and food will come back up in her mouth.  She is not having any pain.  She has had loss of appetite x 4 weeks. She feels tired. She does not have the energy she use to have (new).  BMs x 1 every 2-3 days with a laxative No melena or BRRB   10/16/2016 H. Pylori breath test negative. Colonoscopy is up to date (2013). Biopsy Tubular adenoma.       05/07/2011 Colonoscopy: average screening.  Impression:  Examination performed to cecum. Two small polyps ablated via cold biopsy and submitted together(cecum and hepatic flexure). Two small diverticula at splenic flexure. Small external hemorrhoids.  CMP Latest Ref Rng & Units 10/16/2016 10/09/2015 04/19/2015  Glucose 65 - 99 mg/dL 84 95 78  BUN 7 - 25 mg/dL 7 9.3 11  Creatinine 0.50 - 0.99 mg/dL 1.16(H) 0.8 0.78  Sodium 135 - 146 mmol/L 144 144 143  Potassium 3.5 - 5.3 mmol/L 3.5 3.8 3.7  Chloride 98 - 110 mmol/L 106 - 107  CO2 20 - 32 mmol/L 27 26 26   Calcium 8.6 - 10.4 mg/dL 9.4 9.2 8.8  Total Protein 6.1 - 8.1 g/dL 6.9 6.7 -  Total Bilirubin 0.2 - 1.2 mg/dL 0.6 0.39 -  Alkaline Phos 40 - 150 U/L - 94 -  AST 10 - 35 U/L 18 17 -  ALT 6 - 29 U/L 7 9 -    CBC    Component Value Date/Time   WBC 7.1 10/16/2016 1220   RBC 4.35 10/16/2016 1220   HGB 11.6 (L) 10/16/2016 1220   HGB 11.5 (L) 10/09/2015 0958   HCT 36.4 10/16/2016 1220   HCT 36.4 10/09/2015 0958   PLT 207  10/16/2016 1220   PLT 184 10/09/2015 0958   MCV 83.7 10/16/2016 1220   MCV 83.5 10/09/2015 0958   MCH 26.7 (L) 10/16/2016 1220   MCHC 31.9 (L) 10/16/2016 1220   RDW 13.4 10/16/2016 1220   RDW 15.0 (H) 10/09/2015 0958   LYMPHSABS 1.6 10/09/2015 0958   MONOABS 0.6 10/09/2015 0958   EOSABS 0.1 10/09/2015 0958   BASOSABS 0.0 10/09/2015 0958      Review of Systems Past Medical History:  Diagnosis Date  . Allergy   . Arthritis    knee  . Blood transfusion   . Cancer [facility])    right breast T1cN2  . PONV (postoperative nausea and vomiting)     Past Surgical History:  Procedure Laterality Date  . ABDOMINAL HYSTERECTOMY  1984  . BREAST SURGERY  right MRM, left simple mastectomy   2012  . BREAST SURGERY  left mastectomy   2012  . COLONOSCOPY  05/07/2011   Procedure: COLONOSCOPY;  Surgeon: Rogene Houston, MD;  Location: [facility];  Service: Endoscopy;  Laterality: N/A;  830  . JOINT REPLACEMENT  11/2009   right knee  . PORT-A-CATH REMOVAL  07/16/10  . TOTAL KNEE ARTHROPLASTY  2011    Allergies  Allergen Reactions  . Codeine Itching    Arms and face    Current Outpatient Prescriptions on File Prior to Visit  Medication Sig Dispense Refill  . acetaminophen (TYLENOL) 500 MG tablet Take 1,000 mg by mouth every 6 (six) hours as needed for headache.    Marland Kitchen aspirin 81 MG tablet Take 81 mg by mouth daily.    . Bisacodyl (LAXATIVE PO) Take 1-2 tablets by mouth at bedtime.     . tamoxifen (NOLVADEX) 20 MG tablet Take 1 tablet (20 mg total) by mouth daily. 90 tablet 4   No current facility-administered medications on file prior to visit.         Objective:   Physical Exam Blood pressure 136/70, pulse 72, temperature 98.4 F (36.9 C), height 5\' 5"  (9.741 m), weight 157 lb 3.2 oz (71.3 kg). Alert and oriented. Skin warm and dry. Oral mucosa is moist.   . Sclera anicteric, conjunctivae is pink. Thyroid not enlarged. No cervical lymphadenopathy. Lungs clear. Heart regular rate and  rhythm.  Abdomen is soft. Bowel sounds are positive. No hepatomegaly. No abdominal masses felt. No tenderness.  No edema to lower extremities.           Assessment & Plan:  Anorexia, wt loss. Early Satiety: Will proceed with an EGD.  Patient also would like an EGD.

## 2016-10-30 ENCOUNTER — Ambulatory Visit (HOSPITAL_COMMUNITY)
Admission: RE | Admit: 2016-10-30 | Discharge: 2016-10-30 | Disposition: A | Discharge status: Home / Self Care | Payer: Medicare Other | Source: Ambulatory Visit | Attending: Family Medicine | Admitting: Family Medicine

## 2016-10-30 DIAGNOSIS — E041 Nontoxic single thyroid nodule: Secondary | ICD-10-CM | POA: Diagnosis present

## 2016-10-31 ENCOUNTER — Other Ambulatory Visit: Payer: Self-pay

## 2016-10-31 DIAGNOSIS — C50919 Malignant neoplasm of unspecified site of unspecified female breast: Secondary | ICD-10-CM

## 2016-11-02 NOTE — Progress Notes (Signed)
ID: Gina Nelson   DOB: [birth year]  MR#: [id number]  PJK#:[id number]  PCP: [name], MD GYN: SU: [name] OTHER MD:  CHIEF COMPLAINT: right breast cancer  CURRENT TREATMENT: tamoxifen   BREAST CANCER HISTORY: From the original intake note:  The patient herself noted a mass in her right breast.  She brought it to [name] attention and worked her up for diagnostic mammography and ultrasonography December 29.  [name] was able to palpate a discrete mass in the right breast associated with skin thickening.  By mammography, there was an obscured mass associated with distortion in that area by ultrasound.  The mass was irregular and hypoechoic measuring 1.9 cm.  In the right axilla, there was an enlarged lymph node measuring 2.1 cm.  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  This appeared to be low grade and strongly ER positive at 100%, PR was 34%, the MIB-1 was 41% and there was no evidence of HER2/neu amplification by CISH with a ratio of 1.58.  With this information, the patient was referred to [name] and bilateral breast MRIs were obtained on January 11.  The MRI found a 4.9 cm lobulated, irregular mass with spiculated margins in the right breast at the 7 o'clock location with some skin and trabecular enhancement. There was linear enhancement extending to the nipple, but no apparent extension to the pectoralis muscle.  There were two lymph nodes noted in the right axilla, one measuring 8 mm, the other 6 mm.  There was no internal mammary adenopathy noted and the left breast was unremarkable.  Her subsequent history is as detailed below  INTERVAL HISTORY: Gina Nelson returns today for follow-up and treatment of her estrogen receptor positive breast cancer. She  continues on tamoxifen, which she tolerates well. She denies hot flashes or increase in vaginal wetness. She pays $24 for a 3 month supply.    Since [name]'s last visit to the office, she has underwent CXR on 10/16/2016 due to dyspepsia and weight loss with results that showed no acute findings. She also had an US thyroid completed on 10/30/2016 with results that showed a nodule that would need annual follow up.    REVIEW OF SYSTEMS: Gina Nelson reports decreased appetite and weight loss of less than 5 lbs that was evaluated by her PCP, Dr. [name] with US Thyroid. She reports pressure sensation to her esophagus.  She is able to swallow water without difficulty. She notes that she has mild acid reflux with eating solid foods. She denies changes in her taste. She will have an EGD completed by Dr. [name] on 12/12/2016. She exercises by walking with her husband regularly. She denies unusual headaches, visual changes, nausea, vomiting, or dizziness. There has been no unusual cough, phlegm production, or pleurisy. This been no change in bowel or bladder habits. She denies unexplained fatigue or unexplained weight loss, bleeding, rash, or fever. A detailed review of systems was otherwise stable.      PAST MEDICAL HISTORY: Past Medical History:  Diagnosis Date  . Allergy   . Arthritis    knee  . Blood transfusion   . Cancer [facility])    right breast T1cN2  . PONV (postoperative nausea and vomiting)     PAST SURGICAL HISTORY: Past Surgical History:  Procedure Laterality Date  .  ABDOMINAL HYSTERECTOMY  1984  . BREAST SURGERY  right MRM, left simple mastectomy   2012  . BREAST SURGERY  left mastectomy   2012  . COLONOSCOPY  05/07/2011   Procedure: COLONOSCOPY;  Surgeon: Rogene Houston, MD;  Location: [facility]ITE;  Service: Endoscopy;  Laterality: N/A;  830  . JOINT REPLACEMENT  11/2009   right knee  . PORT-A-CATH REMOVAL  07/16/10  . TOTAL KNEE ARTHROPLASTY  2011    FAMILY HISTORY Family History   Problem Relation Age of Onset  . Prostate cancer Father   . Pancreatic cancer Sister   . Pneumonia Mother   . Hypertension Daughter   The patient's father had prostate cancer metastatic to bone. He died at the age of 69. There is no other history of cancer in the family to the patient's knowledge.  GYNECOLOGIC HISTORY: Menarche age 64. She stopped having periods of course with her hysterectomy in 1984. She is Gx, P1. She was 18 at the time of that delivery. She used hormone replacement for about two and a half years.  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 Her husband, [name], is a retired Airline pilot and currently a Curator. Daughter, [name], keeps a home for handicapped girls. Her husband, [name], works in the Therapist, sports business. The patient has one granddaughter who is in her late teens.    ADVANCED DIRECTIVES:  HEALTH MAINTENANCE: Social History  Substance Use Topics  . Smoking status: Never Smoker  . Smokeless tobacco: Never Used  . Alcohol use No   She had a colonoscopy April 2013 which showed only a small tubular adenoma. She had a bone density September 2012 which was normal.   Allergies  Allergen Reactions  . Codeine Itching    Arms and face    Current Outpatient Prescriptions  Medication Sig Dispense Refill  . acetaminophen (TYLENOL) 500 MG tablet Take 1,000 mg by mouth every 6 (six) hours as needed for headache.    Marland Kitchen aspirin 81 MG tablet Take 81 mg by mouth daily.    . Bisacodyl (LAXATIVE PO) Take 1-2 tablets by mouth at bedtime.     . tamoxifen (NOLVADEX) 20 MG tablet Take 1 tablet (20 mg total) by mouth daily. 90 tablet 4   No current facility-administered medications for this visit.     OBJECTIVE: Middle-aged [place] American woman who appears well  Vitals:   11/03/16 0950  BP: (!) 141/48  Pulse: 63  Resp: 18  Temp: 98 F (36.7 C)  SpO2: 100%      Body mass index is 26.34 kg/m.    ECOG FS: 0  Sclerae unicteric, EOMs intact Oropharynx clear and moist No cervical or supraclavicular adenopathy Lungs no rales or rhonchi Heart regular rate and rhythm Abd soft, nontender, positive bowel sounds MSK no focal spinal tenderness, no upper extremity lymphedema Neuro: nonfocal, well oriented, appropriate affect Breasts: She has undergone bilateral mastectomies and on the right also adjuvant radiation. There are telangiectasias on the right. There is no evidence of chest wall recurrence. Both axillae are benign.  LAB RESULTS: Lab Results  Component Value Date   WBC 5.9 11/03/2016   NEUTROABS 3.9 11/03/2016   HGB 11.2 (L) 11/03/2016   HCT 35.1 11/03/2016   MCV 84.3 11/03/2016   PLT 145 11/03/2016      Chemistry      Component Value Date/Time   NA 144 10/16/2016 1220  NA 144 10/09/2015 0958   K 3.5 10/16/2016 1220   K 3.8 10/09/2015 0958   CL 106 10/16/2016 1220   CL 109 (H) 11/27/2011 1454   CO2 27 10/16/2016 1220   CO2 26 10/09/2015 0958   BUN 7 10/16/2016 1220   BUN 9.3 10/09/2015 0958   CREATININE 1.16 (H) 10/16/2016 1220   CREATININE 0.8 10/09/2015 0958      Component Value Date/Time   CALCIUM 9.4 10/16/2016 1220   CALCIUM 9.2 10/09/2015 0958   ALKPHOS 94 10/09/2015 0958   AST 18 10/16/2016 1220   AST 17 10/09/2015 0958   ALT 7 10/16/2016 1220   ALT 9 10/09/2015 0958   BILITOT 0.6 10/16/2016 1220   BILITOT 0.39 10/09/2015 0958       Lab Results  Component Value Date   LABCA2 25 11/27/2011    No components found for: OEVOJ500  No results for input(s): INR in the last 168 hours.  Urinalysis    Component Value Date/Time   COLORURINE YELLOW 11/13/2013 1430   APPEARANCEUR CLEAR 11/13/2013 1430   LABSPEC 1.010 11/13/2013 1430   PHURINE 6.5 11/13/2013 1430   GLUCOSEU NEGATIVE 11/13/2013 1430   HGBUR NEGATIVE 11/13/2013 1430   HGBUR negative 02/21/2009 1509   BILIRUBINUR small 07/22/2016 1037   KETONESUR  NEGATIVE 11/13/2013 1430   PROTEINUR 100 07/22/2016 1037   PROTEINUR NEGATIVE 11/13/2013 1430   UROBILINOGEN 0.2 07/22/2016 1037   UROBILINOGEN 0.2 11/13/2013 1430   NITRITE neg 07/22/2016 1037   NITRITE NEGATIVE 11/13/2013 1430   LEUKOCYTESUR Trace (A) 07/22/2016 1037    STUDIES: Dg Chest 2 View  Result Date: 10/16/2016 CLINICAL DATA:  Dyspepsia, heartburn.  Weight loss. EXAM: CHEST  2 VIEW COMPARISON:  12/01/2015. FINDINGS: Mild cardiomegaly. 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 Small surgical clips overlie the RIGHT lateral chest wall. RIGHT mastectomy. No active infiltrates or failure. No osseous findings. IMPRESSION: Stable chest.  No acute findings. Electronically Signed   By: Staci Righter M.D.   On: 10/16/2016 16:15   US Thyroid  Result Date: 10/30/2016 CLINICAL DATA:  Incidental on CT. EXAM: THYROID ULTRASOUND TECHNIQUE: Ultrasound examination of the thyroid gland and adjacent soft tissues was performed. COMPARISON:  None. FINDINGS: Parenchymal Echotexture: Mildly heterogenous Isthmus: 0.3 cm Right lobe: 4.1 x 1.4 x 1.3 cm Left lobe: 4.7 x 1.5 x 1.2 cm _________________________________________________________ Estimated total number of nodules >/= 1 cm: 2 Number of spongiform nodules >/=  2 cm not described below (TR1): 0 Number of mixed cystic and solid nodules >/= 1.5 cm not described below (TR2): 0 _________________________________________________________ Nodule # 1: Location: Right; Mid Maximum size: 1.4 cm; Other 2 dimensions: 1.4 x 0.8 cm Composition: solid/almost completely solid (2) Echogenicity: hypoechoic (2) Shape: not taller-than-wide (0) Margins: smooth (0) Echogenic foci: none (0) ACR TI-RADS total points: 4. ACR TI-RADS risk category: TR4 (4-6 points). ACR TI-RADS recommendations: *Given size (>/= 1 - 1.4 cm) and appearance, a follow-up ultrasound in 1 year should be considered based on TI-RADS criteria.  _________________________________________________________ There are benign appearing cysts in the right lobe. Small left nodules measuring 0.7 cm or less do not meet criteria for biopsy or follow-up. IMPRESSION: Nodule 1 in the right mid lobe meets criteria for annual follow-up. The above is in keeping with the ACR TI-RADS recommendations - J Am Coll Radiol 2017;14:587-595. Electronically Signed   By: Marybelle Killings M.D.   On: 10/30/2016 12:54    ASSESSMENT: 66 y.o.  [place]  woman status post right breast and axillary lymph node biopsy January 2012, both positive for an invasive ductal carcinoma, grade 3, measuring 4.9 cm by MRI, and so stage IIB/IIIA clinically. The tumor was ER positive, PR positive, HER2 negative, with an elevated MIB-1.   (1) She received neoadjuvant docetaxel/ cyclophosphamide x4 followed by doxorubicin/ cyclophosphamide x1, at which point neoadjuvant treatment was discontinued because of poor tolerance  (2) she underwent right modified radical mastectomy with left prophylactic mastectomy June 2012. The left side was benign. On the right, she had 2 areas of residual tumor, one measuring 1.1 cm, the other less than a centimeter, both grade 3, involving 4 out of 13 lymph nodes sampled. Repeat HER2 was again negative, and repeat estrogen and progesterone receptors were again positive at 94% and 83%, respectively.   (3) adjuvant radiation treatments completed September of 2012, at which time she started letrozole, discontinued because of arthralgias/myalgias  (4) tamoxifen started February 2013, to be continued for a total of 10 years  (5) right mid lobe thyroid nodule, requiring annual follow-up   PLAN:  Gina Nelson is now over 6 years out from definitive surgery for her breast cancer, with no evidence of disease recurrence. This is very favorable.  Given her locally advanced disease we are continuing tamoxifen for a total of 10 years. She is tolerating this well.  I have  encouraged her to increase her fluid intake during the day.She will let us know she continues to lose weight but it would take approximately a 15 pound unplanned weight loss o trigger further evaluation. I suspect her EGD and possibly esophageal dilatation under Dr. [name] may take care of that problem  She will see me again in a year she knows to call for any issues that may develop before that visit.  Takeru Bose, Virgie Dad, MD  11/03/16 9:58 AM Medical Oncology and Hematology [facility] [address] [place], [place] [address] Tel. [phone]    Fax. [phone]  This document serves as a record of services personally performed by Lurline Del, MD. It was created on her behalf by Steva Colder, a trained medical scribe. The creation of this record is based on the scribe's personal observations and the provider's statements to them. This document has been checked and approved by the attending provider.

## 2016-11-03 ENCOUNTER — Telehealth: Payer: Self-pay | Admitting: Oncology

## 2016-11-03 ENCOUNTER — Other Ambulatory Visit: Payer: Self-pay

## 2016-11-03 ENCOUNTER — Other Ambulatory Visit (HOSPITAL_BASED_OUTPATIENT_CLINIC_OR_DEPARTMENT_OTHER): Payer: Medicare Other

## 2016-11-03 ENCOUNTER — Ambulatory Visit (HOSPITAL_BASED_OUTPATIENT_CLINIC_OR_DEPARTMENT_OTHER): Payer: Medicare Other | Admitting: Oncology

## 2016-11-03 DIAGNOSIS — C50511 Malignant neoplasm of lower-outer quadrant of right female breast: Secondary | ICD-10-CM | POA: Diagnosis not present

## 2016-11-03 DIAGNOSIS — R634 Abnormal weight loss: Secondary | ICD-10-CM | POA: Diagnosis not present

## 2016-11-03 DIAGNOSIS — C773 Secondary and unspecified malignant neoplasm of axilla and upper limb lymph nodes: Secondary | ICD-10-CM

## 2016-11-03 DIAGNOSIS — C50811 Malignant neoplasm of overlapping sites of right female breast: Secondary | ICD-10-CM | POA: Insufficient documentation

## 2016-11-03 DIAGNOSIS — R630 Anorexia: Secondary | ICD-10-CM

## 2016-11-03 DIAGNOSIS — E041 Nontoxic single thyroid nodule: Secondary | ICD-10-CM | POA: Diagnosis not present

## 2016-11-03 DIAGNOSIS — C50919 Malignant neoplasm of unspecified site of unspecified female breast: Secondary | ICD-10-CM

## 2016-11-03 DIAGNOSIS — E876 Hypokalemia: Secondary | ICD-10-CM

## 2016-11-03 DIAGNOSIS — Z170 Estrogen receptor positive status [ER+]: Secondary | ICD-10-CM | POA: Insufficient documentation

## 2016-11-03 LAB — COMPREHENSIVE METABOLIC PANEL
ALBUMIN: 3.6 g/dL (ref 3.5–5.0)
ALK PHOS: 83 U/L (ref 40–150)
ALT: 11 U/L (ref 0–55)
ANION GAP: 9 meq/L (ref 3–11)
AST: 26 U/L (ref 5–34)
BILIRUBIN TOTAL: 0.46 mg/dL (ref 0.20–1.20)
BUN: 8.4 mg/dL (ref 7.0–26.0)
CO2: 28 meq/L (ref 22–29)
Calcium: 9.1 mg/dL (ref 8.4–10.4)
Chloride: 108 mEq/L (ref 98–109)
Creatinine: 0.9 mg/dL (ref 0.6–1.1)
EGFR: >60 mL/min/{1.73_m2} (ref >60)
Glucose: 85 mg/dl (ref 70–140)
Potassium: 3 mEq/L — CL (ref 3.5–5.1)
Sodium: 144 mEq/L (ref 136–145)
TOTAL PROTEIN: 7 g/dL (ref 6.4–8.3)

## 2016-11-03 LAB — CBC WITH DIFFERENTIAL/PLATELET
BASO%: 0.9 % (ref 0.0–2.0)
Basophils Absolute: 0.1 10*3/uL (ref 0.0–0.1)
EOS ABS: 0.2 10*3/uL (ref 0.0–0.5)
EOS%: 3.6 % (ref 0.0–7.0)
HEMATOCRIT: 35.1 % (ref 34.8–46.6)
HEMOGLOBIN: 11.2 g/dL — AB (ref 11.6–15.9)
LYMPH#: 1.2 10*3/uL (ref 0.9–3.3)
LYMPH%: 20.7 % (ref 14.0–49.7)
MCH: 26.8 pg (ref 25.1–34.0)
MCHC: 31.8 g/dL (ref 31.5–36.0)
MCV: 84.3 fL (ref 79.5–101.0)
MONO#: 0.6 10*3/uL (ref 0.1–0.9)
MONO%: 9.6 % (ref 0.0–14.0)
NEUT%: 65.2 % (ref 38.4–76.8)
NEUTROS ABS: 3.9 10*3/uL (ref 1.5–6.5)
PLATELETS: 145 10*3/uL (ref 145–400)
RBC: 4.16 10*6/uL (ref 3.70–5.45)
RDW: 15.3 % — ABNORMAL HIGH (ref 11.2–14.5)
WBC: 5.9 10*3/uL (ref 3.9–10.3)

## 2016-11-03 MED ORDER — POTASSIUM CHLORIDE CRYS ER 10 MEQ PO TBCR: 10.0000 meq | EXTENDED_RELEASE_TABLET | Freq: Every day | ORAL | 0 refills | Status: DC

## 2016-11-03 MED ORDER — TAMOXIFEN CITRATE 20 MG PO TABS: 20.0000 mg | ORAL_TABLET | Freq: Every day | ORAL | 4 refills | Status: DC

## 2016-11-03 NOTE — Telephone Encounter (Signed)
Gave patient avs and calendar with appts.  °

## 2016-11-03 NOTE — Telephone Encounter (Signed)
Serum potassium level is 3.0. Prescription for potassium chloride 10 meq sent to pt's pharmacy. Lab appt made for pt to return on 12/08/16 to have potassium level rechecked. Spoke with pt by phone to advise of above.

## 2016-11-04 ENCOUNTER — Telehealth: Payer: Self-pay | Admitting: Family Medicine

## 2016-11-04 NOTE — Telephone Encounter (Signed)
Patient wants you to know that she uses [place].

## 2016-11-04 NOTE — Telephone Encounter (Signed)
Noted  

## 2016-11-07 ENCOUNTER — Encounter (INDEPENDENT_AMBULATORY_CARE_PROVIDER_SITE_OTHER): Payer: Self-pay | Admitting: *Deleted

## 2016-12-03 ENCOUNTER — Encounter: Payer: Medicare Other | Admitting: Family Medicine

## 2016-12-08 ENCOUNTER — Other Ambulatory Visit (HOSPITAL_BASED_OUTPATIENT_CLINIC_OR_DEPARTMENT_OTHER): Payer: Medicare Other

## 2016-12-08 ENCOUNTER — Ambulatory Visit (INDEPENDENT_AMBULATORY_CARE_PROVIDER_SITE_OTHER): Payer: Medicare Other | Admitting: Family Medicine

## 2016-12-08 ENCOUNTER — Other Ambulatory Visit: Payer: Self-pay | Admitting: Oncology

## 2016-12-08 ENCOUNTER — Encounter: Payer: Medicare Other | Admitting: Family Medicine

## 2016-12-08 ENCOUNTER — Encounter: Payer: Self-pay | Admitting: Family Medicine

## 2016-12-08 VITALS — BP 110/70 | HR 76 | Resp 16 | Ht 65.0 in | Wt 158.0 lb

## 2016-12-08 DIAGNOSIS — Z170 Estrogen receptor positive status [ER+]: Principal | ICD-10-CM

## 2016-12-08 DIAGNOSIS — C773 Secondary and unspecified malignant neoplasm of axilla and upper limb lymph nodes: Secondary | ICD-10-CM | POA: Diagnosis not present

## 2016-12-08 DIAGNOSIS — Z1211 Encounter for screening for malignant neoplasm of colon: Secondary | ICD-10-CM

## 2016-12-08 DIAGNOSIS — C50511 Malignant neoplasm of lower-outer quadrant of right female breast: Secondary | ICD-10-CM | POA: Diagnosis not present

## 2016-12-08 DIAGNOSIS — Z0000 Encounter for general adult medical examination without abnormal findings: Secondary | ICD-10-CM

## 2016-12-08 DIAGNOSIS — C50811 Malignant neoplasm of overlapping sites of right female breast: Secondary | ICD-10-CM

## 2016-12-08 LAB — CBC WITH DIFFERENTIAL/PLATELET
BASO%: 0.2 % (ref 0.0–2.0)
Basophils Absolute: 0 10*3/uL (ref 0.0–0.1)
EOS%: 1.8 % (ref 0.0–7.0)
Eosinophils Absolute: 0.1 10*3/uL (ref 0.0–0.5)
HCT: 36.1 % (ref 34.8–46.6)
HGB: 11.3 g/dL — ABNORMAL LOW (ref 11.6–15.9)
LYMPH%: 24.2 % (ref 14.0–49.7)
MCH: 27 pg (ref 25.1–34.0)
MCHC: 31.3 g/dL — AB (ref 31.5–36.0)
MCV: 86.4 fL (ref 79.5–101.0)
MONO#: 0.5 10*3/uL (ref 0.1–0.9)
MONO%: 8.2 % (ref 0.0–14.0)
NEUT#: 4.3 10*3/uL (ref 1.5–6.5)
NEUT%: 65.6 % (ref 38.4–76.8)
PLATELETS: 183 10*3/uL (ref 145–400)
RBC: 4.18 10*6/uL (ref 3.70–5.45)
RDW: 15.6 % — ABNORMAL HIGH (ref 11.2–14.5)
WBC: 6.6 10*3/uL (ref 3.9–10.3)
lymph#: 1.6 10*3/uL (ref 0.9–3.3)

## 2016-12-08 LAB — COMPREHENSIVE METABOLIC PANEL
ALK PHOS: 89 U/L (ref 40–150)
ALT: 8 U/L (ref 0–55)
AST: 17 U/L (ref 5–34)
Albumin: 3.5 g/dL (ref 3.5–5.0)
Anion Gap: 8 mEq/L (ref 3–11)
BILIRUBIN TOTAL: 0.31 mg/dL (ref 0.20–1.20)
BUN: 10 mg/dL (ref 7.0–26.0)
CO2: 25 mEq/L (ref 22–29)
CREATININE: 0.9 mg/dL (ref 0.6–1.1)
Calcium: 9.2 mg/dL (ref 8.4–10.4)
Chloride: 108 mEq/L (ref 98–109)
EGFR: >60 mL/min/{1.73_m2} (ref >60)
GLUCOSE: 96 mg/dL (ref 70–140)
Potassium: 3.7 mEq/L (ref 3.5–5.1)
SODIUM: 142 meq/L (ref 136–145)
TOTAL PROTEIN: 7 g/dL (ref 6.4–8.3)

## 2016-12-08 LAB — POC HEMOCCULT BLD/STL (OFFICE/1-CARD/DIAGNOSTIC): FECAL OCCULT BLD: NEGATIVE

## 2016-12-08 NOTE — Assessment & Plan Note (Signed)

## 2016-12-08 NOTE — Patient Instructions (Addendum)
Wellness exam with nurse end April, call if you need me sooner  Annual phyical exam with MD in 1 year  Ecxellent exam , no changes at this time  ,It is important that you exercise regularly at least 30 minutes 5 times a week. If you develop chest pain, have severe difficulty breathing, or feel very tired, stop exercising immediately and seek medical attention   If you get a cut , call and come for TdAP  May get Shingrix, at the pharmacy

## 2016-12-08 NOTE — Progress Notes (Signed)
    Gina Nelson     MRN: [id number]      DOB: 1950/11/06  HPI: Patient is in for annual physical exam. No other health concerns are expressed or addressed at the visit. Recent labs, if available are reviewed. Immunization is reviewed , and  updated if needed.   PE: BP 110/70   Pulse 76   Resp 16   Ht 5\' 5"  (1.651 m)   Wt 158 lb (71.7 kg)   SpO2 98%   BMI 26.29 kg/m   Pleasant  female, alert and oriented x 3, in no cardio-pulmonary distress. Afebrile. HEENT No facial trauma or asymetry. Sinuses non tender.  Extra occullar muscles intact, pupils equally reactive to light. External ears normal, tympanic membranes clear. Oropharynx moist, no exudate. Neck: supple, no adenopathy,JVD or thyromegaly.No bruits.  Chest: Clear to ascultation bilaterally.No crackles or wheezes. Non tender to palpation  Breast: Bilateral mastectomy  Cardiovascular system; Heart sounds normal,  S1 and  S2 ,no S3.  No murmur, or thrill. Apical beat not displaced Peripheral pulses normal.  Abdomen: Soft, non tender, no organomegaly or masses. No bruits. Bowel sounds normal. No guarding, tenderness or rebound.  Rectal:  Normal sphincter tone. No rectal mass. Guaiac negative stool.  GU: Not examined   Musculoskeletal exam: Full ROM of spine, hips , shoulders and knees. No deformity ,swelling or crepitus noted. No muscle wasting or atrophy.   Neurologic: Cranial nerves 2 to 12 intact. Power, tone ,sensation and reflexes normal throughout. No disturbance in gait. No tremor.  Skin: Intact, no ulceration, erythema , scaling or rash noted. Pigmentation normal throughout  Psych; Normal mood and affect. Judgement and concentration normal   Assessment & Plan:  Annual physical exam Annual exam as documented. Counseling done  re healthy lifestyle involving commitment to 150 minutes exercise per week, heart healthy diet, and attaining healthy weight.The importance of adequate sleep  also discussed. Regular seat belt use and home safety, is also discussed. Changes in health habits are decided on by the patient with goals and time frames  set for achieving them. Immunization and cancer screening needs are specifically addressed at this visit.

## 2016-12-08 NOTE — Addendum Note (Signed)
Addended by: Eual Fines on: 12/08/2016 09:04 AM   Modules accepted: Orders

## 2016-12-12 ENCOUNTER — Ambulatory Visit (HOSPITAL_COMMUNITY)
Admission: RE | Admit: 2016-12-12 | Discharge: 2016-12-12 | Disposition: A | Discharge status: Home / Self Care | Payer: Medicare Other | Source: Ambulatory Visit | Attending: Internal Medicine | Admitting: Internal Medicine

## 2016-12-12 ENCOUNTER — Encounter (HOSPITAL_COMMUNITY)
Admission: RE | Disposition: A | Discharge status: Home / Self Care | Payer: Self-pay | Source: Ambulatory Visit | Attending: Internal Medicine

## 2016-12-12 DIAGNOSIS — R6881 Early satiety: Secondary | ICD-10-CM | POA: Insufficient documentation

## 2016-12-12 DIAGNOSIS — Z7982 Long term (current) use of aspirin: Secondary | ICD-10-CM | POA: Diagnosis not present

## 2016-12-12 DIAGNOSIS — R1110 Vomiting, unspecified: Secondary | ICD-10-CM | POA: Diagnosis not present

## 2016-12-12 DIAGNOSIS — R630 Anorexia: Secondary | ICD-10-CM | POA: Diagnosis not present

## 2016-12-12 DIAGNOSIS — F5000 Anorexia nervosa, unspecified: Secondary | ICD-10-CM | POA: Insufficient documentation

## 2016-12-12 DIAGNOSIS — Z90710 Acquired absence of both cervix and uterus: Secondary | ICD-10-CM | POA: Insufficient documentation

## 2016-12-12 DIAGNOSIS — Z96651 Presence of right artificial knee joint: Secondary | ICD-10-CM | POA: Insufficient documentation

## 2016-12-12 DIAGNOSIS — Z853 Personal history of malignant neoplasm of breast: Secondary | ICD-10-CM | POA: Diagnosis not present

## 2016-12-12 DIAGNOSIS — M1710 Unilateral primary osteoarthritis, unspecified knee: Secondary | ICD-10-CM | POA: Insufficient documentation

## 2016-12-12 DIAGNOSIS — Z9013 Acquired absence of bilateral breasts and nipples: Secondary | ICD-10-CM | POA: Insufficient documentation

## 2016-12-12 DIAGNOSIS — K2970 Gastritis, unspecified, without bleeding: Secondary | ICD-10-CM | POA: Insufficient documentation

## 2016-12-12 DIAGNOSIS — K31819 Angiodysplasia of stomach and duodenum without bleeding: Secondary | ICD-10-CM | POA: Insufficient documentation

## 2016-12-12 DIAGNOSIS — Z885 Allergy status to narcotic agent status: Secondary | ICD-10-CM | POA: Diagnosis not present

## 2016-12-12 DIAGNOSIS — Z79899 Other long term (current) drug therapy: Secondary | ICD-10-CM | POA: Insufficient documentation

## 2016-12-12 HISTORY — PX: BIOPSY: SHX5522

## 2016-12-12 HISTORY — PX: ESOPHAGOGASTRODUODENOSCOPY: SHX5428

## 2016-12-12 SURGERY — EGD (ESOPHAGOGASTRODUODENOSCOPY)
Anesthesia: Moderate Sedation

## 2016-12-12 MED ORDER — SODIUM CHLORIDE 0.9 % IV SOLN
INTRAVENOUS | Status: DC
  Administered 2016-12-12: 09:00:00 via INTRAVENOUS

## 2016-12-12 MED ORDER — MEPERIDINE HCL 50 MG/ML IJ SOLN
INTRAMUSCULAR | Status: DC | PRN
  Administered 2016-12-12 (×2): 25 mg via INTRAVENOUS

## 2016-12-12 MED ORDER — PANTOPRAZOLE SODIUM 40 MG PO TBEC
40.0000 mg | DELAYED_RELEASE_TABLET | Freq: Every day | ORAL | 5 refills | Status: DC
Start: 2016-12-12 — End: 2016-12-12

## 2016-12-12 MED ORDER — LIDOCAINE VISCOUS 2 % MT SOLN
OROMUCOSAL | Status: AC
  Filled 2016-12-12: qty 15

## 2016-12-12 MED ORDER — MEPERIDINE HCL 50 MG/ML IJ SOLN
INTRAMUSCULAR | Status: AC
  Filled 2016-12-12: qty 1

## 2016-12-12 MED ORDER — STERILE WATER FOR IRRIGATION IR SOLN
Status: DC | PRN
  Administered 2016-12-12: 11:00:00

## 2016-12-12 MED ORDER — PANTOPRAZOLE SODIUM 40 MG PO TBEC: 40.0000 mg | DELAYED_RELEASE_TABLET | Freq: Every day | ORAL | 5 refills | Status: DC

## 2016-12-12 MED ORDER — MIDAZOLAM HCL 5 MG/5ML IJ SOLN
INTRAMUSCULAR | Status: DC | PRN
Start: 2016-12-12 — End: 2016-12-12
  Administered 2016-12-12 (×2): 2 mg via INTRAVENOUS
  Administered 2016-12-12: 1 mg via INTRAVENOUS

## 2016-12-12 MED ORDER — LIDOCAINE VISCOUS 2 % MT SOLN
OROMUCOSAL | Status: DC | PRN
  Administered 2016-12-12: 4 mL via OROMUCOSAL

## 2016-12-12 MED ORDER — MIDAZOLAM HCL 5 MG/5ML IJ SOLN
INTRAMUSCULAR | Status: AC
  Filled 2016-12-12: qty 10

## 2016-12-12 NOTE — Op Note (Signed)
[facility] Patient Name: Gina Nelson Procedure Date: 12/12/2016 10:17 AM MRN: [id number] Date of Birth: 1950-05-19 Attending MD: Hildred Laser , MD CSN: [id number] Age: 66 Admit Type: Outpatient Procedure:                Upper GI endoscopy Indications:              Anorexia, Early satiety, Regurgitation Providers:                Hildred Laser, MD, Otis Peak B. Sharon Seller, RN, Nelma Rothman, Technician, Randa Spike, Technician Referring MD:             Norwood Levo. [name], MD Medicines:                Lidocaine spray, Meperidine 50 mg IV, Midazolam 5                            mg IV Complications:            No immediate complications. Estimated Blood Loss:     Estimated blood loss: none. Estimated blood loss                            was minimal. Procedure:                Pre-Anesthesia Assessment:                           - Prior to the procedure, a History and Physical                            was performed, and patient medications and                            allergies were reviewed. The patient's tolerance of                            previous anesthesia was also reviewed. The risks                            and benefits of the procedure and the sedation                            options and risks were discussed with the patient.                            All questions were answered, and informed consent                            was obtained. Prior Anticoagulants: The patient                            last took aspirin 3 days prior to the procedure.  ASA Grade Assessment: I - A normal, healthy                            patient. After reviewing the risks and benefits,                            the patient was deemed in satisfactory condition to                            undergo the procedure.                           After obtaining informed consent, the endoscope was                            passed under  direct vision. Throughout the                            procedure, the patient's blood pressure, pulse, and                            oxygen saturations were monitored continuously. The                            [id number] ([id number]) scope was introduced through the                            mouth, and advanced to the second part of duodenum.                            The upper GI endoscopy was accomplished without                            difficulty. The patient tolerated the procedure                            well. Scope In: 10:47:24 AM Scope Out: 10:54:47 AM Total Procedure Duration: 0 hours 7 minutes 23 seconds  Findings:      The examined esophagus was normal.      The Z-line was regular and was found 39 cm from the incisors.      Patchy mild inflammation characterized by congestion (edema), erythema       and granularity was found in the prepyloric region of the stomach.       Biopsies were taken with a cold forceps for histology.      A single small no bleeding angiodysplastic lesion was found in the       gastric antrum.      The exam of the stomach was otherwise normal.      The duodenal bulb and second portion of the duodenum were normal. Impression:               - Normal esophagus.                           - Z-line regular, 39 cm from  the incisors.                           - Gastritis. Biopsied.                           - A single non-bleeding angiodysplastic lesion in                            the stomach.                           - Normal duodenal bulb and second portion of the                            duodenum. Moderate Sedation:      Moderate (conscious) sedation was administered by the endoscopy nurse       and supervised by the endoscopist. The following parameters were       monitored: oxygen saturation, heart rate, blood pressure, CO2       capnography and response to care. Total physician intraservice time was       13 minutes. Recommendation:            - Patient has a contact number available for                            emergencies. The signs and symptoms of potential                            delayed complications were discussed with the                            patient. Return to normal activities tomorrow.                            Written discharge instructions were provided to the                            patient.                           - Resume previous diet today.                           - Continue present medications.                           - Await pathology results.                           - Pantoprazole 40 mg po qam. Procedure Code(s):        --- Professional ---                           [id number], Esophagogastroduodenoscopy, flexible,                            transoral; with  biopsy, single or multiple                           99152, Moderate sedation services provided by the                            same physician or other qualified health care                            professional performing the diagnostic or                            therapeutic service that the sedation supports,                            requiring the presence of an independent trained                            observer to assist in the monitoring of the                            patient's level of consciousness and physiological                            status; initial 15 minutes of intraservice time,                            patient age 25 years or older Diagnosis Code(s):        --- Professional ---                           K29.70, Gastritis, unspecified, without bleeding                           K31.819, Angiodysplasia of stomach and duodenum                            without bleeding                           R63.0, Anorexia                           R68.81, Early satiety                           R11.10, Vomiting, unspecified CPT copyright 2016 American Medical Association. All rights reserved. The codes  documented in this report are preliminary and upon coder review may  be revised to meet current compliance requirements. Hildred Laser, MD Hildred Laser, MD 12/12/2016 11:05:37 AM This report has been signed electronically. Number of Addenda: 0

## 2016-12-12 NOTE — Discharge Instructions (Signed)
Resume usual medications as before. Pantoprazole 40 mg by mouth 30 minutes before breakfast daily. Resume usual diet. No driving for 24 hours. Office will call with biopsy results next week.  Esophagogastroduodenoscopy, Care After Refer to this sheet in the next few weeks. These instructions provide you with information about caring for yourself after your procedure. Your health care provider may also give you more specific instructions. Your treatment has been planned according to current medical practices, but problems sometimes occur. Call your health care provider if you have any problems or questions after your procedure. What can I expect after the procedure? After the procedure, it is common to have:  A sore throat.  Nausea.  Bloating.  Dizziness.  Fatigue.  Follow these instructions at home:  Do not eat or drink anything until the numbing medicine (local anesthetic) has worn off and your gag reflex has returned. You will know that the local anesthetic has worn off when you can swallow comfortably.  Do not drive for 24 hours if you received a medicine to help you relax (sedative).  If your health care provider took a tissue sample for testing during the procedure, make sure to get your test results. This is your responsibility. Ask your health care provider or the department performing the test when your results will be ready.  Keep all follow-up visits as told by your health care provider. This is important. Contact a health care provider if:  You cannot stop coughing.  You are not urinating.  You are urinating less than usual. Get help right away if:  You have trouble swallowing.  You cannot eat or drink.  You have throat or chest pain that gets worse.  You are dizzy or light-headed.  You faint.  You have nausea or vomiting.  You have chills.  You have a fever.  You have severe abdominal pain.  You have black, tarry, or bloody stools. This  information is not intended to replace advice given to you by your health care provider. Make sure you discuss any questions you have with your health care provider. Document Released: 12/17/2011 Document Revised: 06/07/2015 Document Reviewed: 11/23/2014 Elsevier Interactive Patient Education  2018 [employer].  Pantoprazole tablets What is this medicine? PANTOPRAZOLE (pan TOE pra zole) prevents the production of acid in the stomach. It is used to treat gastroesophageal reflux disease (GERD), inflammation of the esophagus, and Zollinger-Ellison syndrome. This medicine may be used for other purposes; ask your health care provider or pharmacist if you have questions. COMMON BRAND NAME(S): Protonix What should I tell my health care provider before I take this medicine? They need to know if you have any of these conditions: -liver disease -low levels of magnesium in the blood -lupus -an unusual or allergic reaction to omeprazole, lansoprazole, pantoprazole, rabeprazole, other medicines, foods, dyes, or preservatives -pregnant or trying to get pregnant -breast-feeding How should I use this medicine? Take this medicine by mouth. Swallow the tablets whole with a drink of water. Follow the directions on the prescription label. Do not crush, break, or chew. Take your medicine at regular intervals. Do not take your medicine more often than directed. Talk to your pediatrician regarding the use of this medicine in children. While this drug may be prescribed for children as young as 5 years for selected conditions, precautions do apply. Overdosage: If you think you have taken too much of this medicine contact a poison control center or emergency room at once. NOTE: This medicine is only for you.  Do not share this medicine with others. What if I miss a dose? If you miss a dose, take it as soon as you can. If it is almost time for your next dose, take only that dose. Do not take double or extra  doses. What may interact with this medicine? Do not take this medicine with any of the following medications: -atazanavir -nelfinavir This medicine may also interact with the following medications: -ampicillin -delavirdine -erlotinib -iron salts -medicines for fungal infections like ketoconazole, itraconazole and voriconazole -methotrexate -mycophenolate mofetil -warfarin This list may not describe all possible interactions. Give your health care provider a list of all the medicines, herbs, non-prescription drugs, or dietary supplements you use. Also tell them if you smoke, drink alcohol, or use illegal drugs. Some items may interact with your medicine. What should I watch for while using this medicine? It can take several days before your stomach pain gets better. Check with your doctor or health care professional if your condition does not start to get better, or if it gets worse. You may need blood work done while you are taking this medicine. What side effects may I notice from receiving this medicine? Side effects that you should report to your doctor or health care professional as soon as possible: -allergic reactions like skin rash, itching or hives, swelling of the face, lips, or tongue -bone, muscle or joint pain -breathing problems -chest pain or chest tightness -dark yellow or brown urine -dizziness -fast, irregular heartbeat -feeling faint or lightheaded -fever or sore throat -muscle spasm -palpitations -rash on cheeks or arms that gets worse in the sun -redness, blistering, peeling or loosening of the skin, including inside the mouth -seizures -tremors -unusual bleeding or bruising -unusually weak or tired -yellowing of the eyes or skin Side effects that usually do not require medical attention (report to your doctor or health care professional if they continue or are bothersome): -constipation -diarrhea -dry mouth -headache -nausea This list may not describe  all possible side effects. Call your doctor for medical advice about side effects. You may report side effects to FDA at 1-800-FDA-1088. Where should I keep my medicine? Keep out of the reach of children. Store at room temperature between 15 and 30 degrees C (59 and 86 degrees F). Protect from light and moisture. Throw away any unused medicine after the expiration date. NOTE: This sheet is a summary. It may not cover all possible information. If you have questions about this medicine, talk to your doctor, pharmacist, or health care provider.  2018 Elsevier/Gold Standard (2015-02-01 12:20:19)

## 2016-12-12 NOTE — H&P (Signed)
Gina Nelson is an 66 y.o. female.   Chief Complaint: Patient is here for EGD. HPI: Patient is a 66 year old African-American female who presents with 74-month history of anorexia early satiety and regurgitation.  She says she has lost close to 5 pounds.  She denies of vomiting or dysphagia.  However she does have feeling of lumbar food in her throat after she finishes eating.  She denies melena diarrhea or epigastric pain.  She also denies fever chills or night sweats.  She is on low-dose aspirin and does not take other OTC NSAIDs.  Past Medical History:  Diagnosis Date  . Allergy   . Arthritis    knee  . Blood transfusion   . Cancer [facility])    right breast T1cN2  . PONV (postoperative nausea and vomiting)     Past Surgical History:  Procedure Laterality Date  . ABDOMINAL HYSTERECTOMY  1984  . BREAST SURGERY  right MRM, left simple mastectomy   2012  . BREAST SURGERY  left mastectomy   2012  . COLONOSCOPY  05/07/2011   Procedure: COLONOSCOPY;  Surgeon: Rogene Houston, MD;  Location: [facility];  Service: Endoscopy;  Laterality: N/A;  830  . JOINT REPLACEMENT  11/2009   right knee  . PORT-A-CATH REMOVAL  07/16/10  . TOTAL KNEE ARTHROPLASTY  2011    Family History  Problem Relation Age of Onset  . Prostate cancer Father   . Pancreatic cancer Sister   . Pneumonia Mother   . Hypertension Daughter    Social History:  reports that  has never smoked. she has never used smokeless tobacco. She reports that she does not drink alcohol or use drugs.  Allergies:  Allergies  Allergen Reactions  . Codeine Itching    Arms and face    Medications Prior to Admission  Medication Sig Dispense Refill  . acetaminophen (TYLENOL) 500 MG tablet Take 1,000 mg by mouth 2 (two) times daily as needed for headache.     Marland Kitchen aspirin 81 MG tablet Take 81 mg by mouth at bedtime.     . bisacodyl (BISACODYL) 5 MG EC tablet Take 15 mg by mouth every other day.    . Multiple Vitamins-Minerals  (MULTIVITAL) CHEW Chew 1 tablet by mouth daily.    . tamoxifen (NOLVADEX) 20 MG tablet Take 1 tablet (20 mg total) by mouth daily. (Patient taking differently: Take 20 mg by mouth at bedtime. ) 90 tablet 4  . tamoxifen (NOLVADEX) 20 MG tablet TAKE ONE TABLET BY MOUTH ONCE DAILY 90 tablet 4  . potassium chloride SA (K-DUR,KLOR-CON) 10 MEQ tablet Take 1 tablet (10 mEq total) by mouth daily. (Patient not taking: Reported on 12/08/2016) 30 tablet 0    No results found for this or any previous visit (from the past 48 hour(s)). No results found.  ROS  Blood pressure 131/85, pulse 67, temperature 97.8 F (36.6 C), temperature source Oral, resp. rate 18, SpO2 100 %. Physical Exam  Constitutional: She appears well-developed and well-nourished.  HENT:  Mouth/Throat: Oropharynx is clear and moist.  Eyes: Conjunctivae are normal. No scleral icterus.  Neck: No thyromegaly present.  Cardiovascular: Normal rate and regular rhythm.  No murmur heard. Respiratory: Effort normal and breath sounds normal.  GI: Soft. She exhibits no distension and no mass. There is no tenderness.  Musculoskeletal: She exhibits no edema.  Lymphadenopathy:    She has no cervical adenopathy.  Neurological: She is alert.  Skin: Skin is warm and dry.  Assessment/Plan Anorexia and early satiety. Diagnostic EGD.  Hildred Laser, MD 12/12/2016, 10:36 AM

## 2016-12-16 ENCOUNTER — Encounter (HOSPITAL_COMMUNITY): Payer: Self-pay | Admitting: Internal Medicine

## 2016-12-29 ENCOUNTER — Telehealth (INDEPENDENT_AMBULATORY_CARE_PROVIDER_SITE_OTHER): Payer: Self-pay | Admitting: Internal Medicine

## 2016-12-29 NOTE — Telephone Encounter (Signed)
Diagnosis Stomach, biopsy - BENIGN GASTRIC MUCOSA. NO HELICOBACTER PYLORI, INTESTINAL METAPLASIA OR ACTIVE INFLAMMATION IDENTIFIED. Microscopic Comment A Warthin-Starry stain is performed to determine the possibility of the presence of Helicobacter pylori. The Warthin-Starry stain is negative for organisms of Helicobacter pylori. Susanne Greenhouse MD Pathologist, Electronic Signature (Case signed 12/15/2016) Specimen Gross and Clinical Information Specimen(s) Obtained: Stomach, biopsy Specimen Clinical Information gastric AVM; gastritis; GE Junction @ 39cm (kp) Gross Received in formalin are tan, soft tissue fragments that are submitted in toto. Number: three, Size: range from 0.4 to 0.5 cm. (KL:ah 12/12/16) Stain(s) used in Diagnosis: The following stain(s) were used in diagnosing the case: Warthin-Starry Stain. The control(s) stained appropriately. Report signed out from the following location(s) Technical component and interpretation was performed at [facility], [place], [place] [address]. CLIA #: [id number], 1 of

## 2016-12-29 NOTE — Telephone Encounter (Signed)
Patient called regarding her results.  She stated that Dr. [name] called her Saturday evening, but she couldn't quite understand him and is calling for clarification.  [phone]

## 2016-12-30 NOTE — Telephone Encounter (Signed)
Talked with Dr.Rehman , He has called the patient more than once and he has left her voice messages. He plans to call her again.

## 2017-01-02 ENCOUNTER — Other Ambulatory Visit (INDEPENDENT_AMBULATORY_CARE_PROVIDER_SITE_OTHER): Payer: Self-pay | Admitting: *Deleted

## 2017-01-02 DIAGNOSIS — D508 Other iron deficiency anemias: Secondary | ICD-10-CM

## 2017-01-02 NOTE — Progress Notes (Signed)
cbc

## 2017-01-09 ENCOUNTER — Encounter (INDEPENDENT_AMBULATORY_CARE_PROVIDER_SITE_OTHER): Payer: Self-pay | Admitting: *Deleted

## 2017-01-09 ENCOUNTER — Other Ambulatory Visit (INDEPENDENT_AMBULATORY_CARE_PROVIDER_SITE_OTHER): Payer: Self-pay | Admitting: *Deleted

## 2017-01-09 DIAGNOSIS — D508 Other iron deficiency anemias: Secondary | ICD-10-CM

## 2017-01-15 LAB — CBC
HCT: 33.2 % — ABNORMAL LOW (ref 35.0–45.0)
Hemoglobin: 10.7 g/dL — ABNORMAL LOW (ref 11.7–15.5)
MCH: 26.9 pg — ABNORMAL LOW (ref 27.0–33.0)
MCHC: 32.2 g/dL (ref 32.0–36.0)
MCV: 83.4 fL (ref 80.0–100.0)
MPV: 12.1 fL (ref 7.5–12.5)
PLATELETS: 187 10*3/uL (ref 140–400)
RBC: 3.98 10*6/uL (ref 3.80–5.10)
RDW: 13.6 % (ref 11.0–15.0)
WBC: 6.9 10*3/uL (ref 3.8–10.8)

## 2017-01-21 ENCOUNTER — Ambulatory Visit: Payer: Medicare Other | Admitting: Family Medicine

## 2017-04-22 ENCOUNTER — Other Ambulatory Visit: Payer: Self-pay | Admitting: Family Medicine

## 2017-05-04 ENCOUNTER — Ambulatory Visit: Payer: Medicare Other

## 2017-05-08 ENCOUNTER — Ambulatory Visit (INDEPENDENT_AMBULATORY_CARE_PROVIDER_SITE_OTHER): Payer: Medicare Other

## 2017-05-08 VITALS — BP 106/58 | HR 69 | Temp 98.6°F | Resp 12 | Ht 65.0 in | Wt 160.1 lb

## 2017-05-08 DIAGNOSIS — Z0000 Encounter for general adult medical examination without abnormal findings: Secondary | ICD-10-CM

## 2017-05-08 NOTE — Patient Instructions (Signed)
Gina Nelson , Thank you for taking time to come for your Medicare Wellness Visit. I appreciate your ongoing commitment to your health goals. Please review the following plan we discussed and let me know if I can assist you in the future.   Screening recommendations/referrals: Colonoscopy:Due 2023 Bone Density:  Up to date Recommended yearly ophthalmology/optometry visit for glaucoma screening and checkup UTD Recommended yearly dental visit for hygiene and checkup: Already scheduled  Vaccinations: Influenza vaccine: Due Fall 2019 Pneumococcal vaccine: Due Tdap vaccine: When needed for a cut Shingles vaccine: Call your insurance company and see if they will cover this vaccine   Advanced directives: This was discussed during your visit today  Next appointment: December 14, 2017 at 9:20 am with Dr. [name] for you Annual Exam   Preventive Care 29 Years and Older, Female Preventive care refers to lifestyle choices and visits with your health care provider that can promote health and wellness. What does preventive care include?  A yearly physical exam. This is also called an annual well check.  Dental exams once or twice a year.  Routine eye exams. Ask your health care provider how often you should have your eyes checked.  Personal lifestyle choices, including:  Daily care of your teeth and gums.  Regular physical activity.  Eating a healthy diet.  Avoiding tobacco and drug use.  Limiting alcohol use.  Practicing safe sex.  Taking low-dose aspirin every day.  Taking vitamin and mineral supplements as recommended by your health care provider. What happens during an annual well check? The services and screenings done by your health care provider during your annual well check will depend on your age, overall health, lifestyle risk factors, and family history of disease. Counseling  Your health care provider may ask you questions about your:  Alcohol use.  Tobacco  use.  Drug use.  Emotional well-being.  Home and relationship well-being.  Sexual activity.  Eating habits.  History of falls.  Memory and ability to understand (cognition).  Work and work Statistician.  Reproductive health. Screening  You may have the following tests or measurements:  Height, weight, and BMI.  Blood pressure.  Lipid and cholesterol levels. These may be checked every 5 years, or more frequently if you are over 82 years old.  Skin check.  Lung cancer screening. You may have this screening every year starting at age 78 if you have a 30-pack-year history of smoking and currently smoke or have quit within the past 15 years.  Fecal occult blood test (FOBT) of the stool. You may have this test every year starting at age 6.  Flexible sigmoidoscopy or colonoscopy. You may have a sigmoidoscopy every 5 years or a colonoscopy every 10 years starting at age 15.  Hepatitis C blood test.  Hepatitis B blood test.  Sexually transmitted disease (STD) testing.  Diabetes screening. This is done by checking your blood sugar (glucose) after you have not eaten for a while (fasting). You may have this done every 1-3 years.  Bone density scan. This is done to screen for osteoporosis. You may have this done starting at age 18.  Mammogram. This may be done every 1-2 years. Talk to your health care provider about how often you should have regular mammograms. Talk with your health care provider about your test results, treatment options, and if necessary, the need for more tests. Vaccines  Your health care provider may recommend certain vaccines, such as:  Influenza vaccine. This is recommended every year.  Tetanus, diphtheria, and acellular pertussis (Tdap, Td) vaccine. You may need a Td booster every 10 years.  Zoster vaccine. You may need this after age 52.  Pneumococcal 13-valent conjugate (PCV13) vaccine. One dose is recommended after age 26.  Pneumococcal  polysaccharide (PPSV23) vaccine. One dose is recommended after age 84. Talk to your health care provider about which screenings and vaccines you need and how often you need them. This information is not intended to replace advice given to you by your health care provider. Make sure you discuss any questions you have with your health care provider. Document Released: 01/26/2015 Document Revised: 09/19/2015 Document Reviewed: 10/31/2014 Elsevier Interactive Patient Education  2017 [place] Prevention in the Home Falls can cause injuries. They can happen to people of all ages. There are many things you can do to make your home safe and to help prevent falls. What can I do on the outside of my home?  Regularly fix the edges of walkways and driveways and fix any cracks.  Remove anything that might make you trip as you walk through a door, such as a raised step or threshold.  Trim any bushes or trees on the path to your home.  Use bright outdoor lighting.  Clear any walking paths of anything that might make someone trip, such as rocks or tools.  Regularly check to see if handrails are loose or broken. Make sure that both sides of any steps have handrails.  Any raised decks and porches should have guardrails on the edges.  Have any leaves, snow, or ice cleared regularly.  Use sand or salt on walking paths during winter.  Clean up any spills in your garage right away. This includes oil or grease spills. What can I do in the bathroom?  Use night lights.  Install grab bars by the toilet and in the tub and shower. Do not use towel bars as grab bars.  Use non-skid mats or decals in the tub or shower.  If you need to sit down in the shower, use a plastic, non-slip stool.  Keep the floor dry. Clean up any water that spills on the floor as soon as it happens.  Remove soap buildup in the tub or shower regularly.  Attach bath mats securely with double-sided non-slip rug  tape.  Do not have throw rugs and other things on the floor that can make you trip. What can I do in the bedroom?  Use night lights.  Make sure that you have a light by your bed that is easy to reach.  Do not use any sheets or blankets that are too big for your bed. They should not hang down onto the floor.  Have a firm chair that has side arms. You can use this for support while you get dressed.  Do not have throw rugs and other things on the floor that can make you trip. What can I do in the kitchen?  Clean up any spills right away.  Avoid walking on wet floors.  Keep items that you use a lot in easy-to-reach places.  If you need to reach something above you, use a strong step stool that has a grab bar.  Keep electrical cords out of the way.  Do not use floor polish or wax that makes floors slippery. If you must use wax, use non-skid floor wax.  Do not have throw rugs and other things on the floor that can make you trip. What can I do  with my stairs?  Do not leave any items on the stairs.  Make sure that there are handrails on both sides of the stairs and use them. Fix handrails that are broken or loose. Make sure that handrails are as long as the stairways.  Check any carpeting to make sure that it is firmly attached to the stairs. Fix any carpet that is loose or worn.  Avoid having throw rugs at the top or bottom of the stairs. If you do have throw rugs, attach them to the floor with carpet tape.  Make sure that you have a light switch at the top of the stairs and the bottom of the stairs. If you do not have them, ask someone to add them for you. What else can I do to help prevent falls?  Wear shoes that:  Do not have high heels.  Have rubber bottoms.  Are comfortable and fit you well.  Are closed at the toe. Do not wear sandals.  If you use a stepladder:  Make sure that it is fully opened. Do not climb a closed stepladder.  Make sure that both sides of the  stepladder are locked into place.  Ask someone to hold it for you, if possible.  Clearly mark and make sure that you can see:  Any grab bars or handrails.  First and last steps.  Where the edge of each step is.  Use tools that help you move around (mobility aids) if they are needed. These include:  Canes.  Walkers.  Scooters.  Crutches.  Turn on the lights when you go into a dark area. Replace any light bulbs as soon as they burn out.  Set up your furniture so you have a clear path. Avoid moving your furniture around.  If any of your floors are uneven, fix them.  If there are any pets around you, be aware of where they are.  Review your medicines with your doctor. Some medicines can make you feel dizzy. This can increase your chance of falling. Ask your doctor what other things that you can do to help prevent falls. This information is not intended to replace advice given to you by your health care provider. Make sure you discuss any questions you have with your health care provider. Document Released: 10/26/2008 Document Revised: 06/07/2015 Document Reviewed: 02/03/2014 Elsevier Interactive Patient Education  2017 [employer].

## 2017-05-08 NOTE — Progress Notes (Signed)
Subjective:   Gina Nelson is a 67 y.o. female who presents for Medicare Annual (Subsequent) preventive examination.  Review of Systems:   Cardiac Risk Factors include: advanced age (>20men, >32 women)     Objective:     Vitals: BP (!) 106/58 (BP Location: Left Arm, Patient Position: Sitting, Cuff Size: Normal)   Pulse 69   Temp 98.6 F (37 C) (Oral)   Resp 12   Ht 5\' 5"  (1.651 m)   Wt 160 lb 1.9 oz (72.6 kg)   SpO2 99%   BMI 26.65 kg/m   Body mass index is 26.65 kg/m.  Advanced Directives 05/08/2017 12/12/2016 04/24/2016 12/01/2015 10/10/2015 06/25/2015 11/13/2013  Does Patient Have a Medical Advance Directive? No No No No Yes No No  Does patient want to make changes to medical advance directive? No - Patient declined - - - - - -  Would patient like information on creating a medical advance directive? No - Patient declined No - Patient declined No - Patient declined No - patient declined information - No - patient declined information No - patient declined information  Pre-existing out of facility DNR order (yellow form or pink MOST form) - - - - - - -    Tobacco Social History   Tobacco Use  Smoking Status Never Smoker  Smokeless Tobacco Never Used     Counseling given: Yes   Clinical Intake:  Pre-visit preparation completed: Yes  Pain : No/denies pain     BMI - recorded: 26.7 Nutritional Status: BMI 25 -29 Overweight Diabetes: No  How often do you need to have someone help you when you read instructions, pamphlets, or other written materials from your doctor or pharmacy?: 1 - Never  Interpreter Needed?: No  Information entered by :: Gina Nelson  Past Medical History:  Diagnosis Date  . Allergy   . Arthritis    knee  . Blood transfusion   . Cancer [facility])    right breast T1cN2  . PONV (postoperative nausea and vomiting)    Past Surgical History:  Procedure Laterality Date  . ABDOMINAL HYSTERECTOMY  1984  . BIOPSY  12/12/2016   Procedure:  BIOPSY;  Surgeon: Rogene Houston, MD;  [facility];  Service: Endoscopy;;  gastric  . BREAST SURGERY  right MRM, left simple mastectomy   2012  . BREAST SURGERY  left mastectomy   2012  . COLONOSCOPY  05/07/2011   Procedure: COLONOSCOPY;  Surgeon: Rogene Houston, MD;  Location: [facility];  Service: Endoscopy;  Laterality: N/A;  830  . ESOPHAGOGASTRODUODENOSCOPY N/A 12/12/2016   Procedure: ESOPHAGOGASTRODUODENOSCOPY (EGD);  Surgeon: Rogene Houston, MD;  Location: [facility];  Service: Endoscopy;  Laterality: N/A;  9:15  . JOINT REPLACEMENT  11/2009   right knee  . PORT-A-CATH REMOVAL  07/16/10  . TOTAL KNEE ARTHROPLASTY  2011   Family History  Problem Relation Age of Onset  . Prostate cancer Father   . Pancreatic cancer Sister   . Pneumonia Mother   . Hypertension Daughter    Social History   Socioeconomic History  . Marital status: Married    Spouse name: Gina Nelson  . Number of children: 1  . Years of education: Not on file  . Highest education level: 12th grade  Occupational History  . Occupation: retired  Scientific laboratory technician  . Financial resource strain: Not hard at all  . Food insecurity:    Worry: Never true    Inability: Never true  .  Transportation needs:    Medical: No    Non-medical: No  Tobacco Use  . Smoking status: Never Smoker  . Smokeless tobacco: Never Used  Substance and Sexual Activity  . Alcohol use: No  . Drug use: No  . Sexual activity: Yes    Birth control/protection: Surgical  Lifestyle  . Physical activity:    Days per week: 0 days    Minutes per session: 0 min  . Stress: Not at all  Relationships  . Social connections:    Talks on phone: More than three times a week    Gets together: Once a week    Attends religious service: More than 4 times per year    Active member of club or organization: Yes    Attends meetings of clubs or organizations: Never    Relationship status: Married  Other Topics Concern  . Not on file    Social History Narrative   Has been married 13 years. Husband is a Company secretary. She is retired.Visits sick people in the hospital. Enjoys word search puzzles.    Outpatient Encounter Medications as of 05/08/2017  Medication Sig  . acetaminophen (TYLENOL) 500 MG tablet Take 1,000 mg by mouth 2 (two) times daily as needed for headache.   Marland Kitchen aspirin 81 MG tablet Take 81 mg by mouth at bedtime.   . bisacodyl (BISACODYL) 5 MG EC tablet Take 15 mg by mouth every other day.  . gabapentin (NEURONTIN) 100 MG capsule TAKE ONE CAPSULE BY MOUTH AT BEDTIME  . Multiple Vitamins-Minerals (MULTIVITAL) CHEW Chew 1 tablet by mouth daily.  . pantoprazole (PROTONIX) 40 MG tablet Take 1 tablet (40 mg total) by mouth daily before breakfast.  . tamoxifen (NOLVADEX) 20 MG tablet Take 1 tablet (20 mg total) by mouth daily. (Patient taking differently: Take 20 mg by mouth at bedtime. )   No facility-administered encounter medications on file as of 05/08/2017.     Activities of Daily Living In your present state of health, do you have any difficulty performing the following activities: 05/08/2017  Hearing? N  Vision? N  Difficulty concentrating or making decisions? N  Walking or climbing stairs? Y  Dressing or bathing? N  Doing errands, shopping? N  Preparing Food and eating ? N  Using the Toilet? N  In the past six months, have you accidently leaked urine? N  Do you have problems with loss of bowel control? N  Managing your Medications? N  Managing your Finances? N  Housekeeping or managing your Housekeeping? N  Some recent data might be hidden    Patient Care Team: [name], MD as PCP - General [name], MD as Consulting Physician (General Surgery) [name], MD as Consulting Physician (Obstetrics and Gynecology) Magrinat, Virgie Dad, MD as Consulting Physician (Oncology) Madelin Headings, DO as Consulting Physician (Optometry) [name], Mechele Dawley, MD as Consulting Physician  (Gastroenterology)    Assessment:   This is a routine wellness examination for Gina Nelson.  Exercise Activities and Dietary recommendations Current Exercise Habits: The patient does not participate in regular exercise at present, Exercise limited by: orthopedic condition(s)  Goals    . Exercise 3x per week (30 min per time)     Recommend increasing your routine exercise program at least 5 days a week for 15 minutes at a time as tolerated.         Fall Risk Fall Risk  05/08/2017 12/08/2016 04/24/2016 11/16/2015 04/24/2015  Falls in the past year? No No Yes No  Yes  Number falls in past yr: - - 2 or more - 1  Injury with Fall? - - No - No  Risk Factor Category  - - - - -  Risk for fall due to : - - History of fall(s) - -  Follow up - - Falls evaluation completed;Education provided;Falls prevention discussed - -  Comment - - reveals patient accidentaly stumbled - -   Is the patient's home free of loose throw rugs in walkways, pet beds, electrical cords, etc?   yes      Grab bars in the bathroom? no      Handrails on the stairs?   no single level home      Adequate lighting?   yes   Depression Screen PHQ 2/9 Scores 05/08/2017 04/24/2016 11/16/2015 07/25/2014  PHQ - 2 Score 0 0 0 0  PHQ- 9 Score - - - 0     Cognitive Function     6CIT Screen 05/08/2017 04/24/2016  What Year? 0 points 0 points  What month? 0 points 0 points  What time? 0 points 0 points  Count back from 20 0 points 0 points  Months in reverse 0 points 0 points  Repeat phrase 0 points 0 points  Total Score 0 0    Immunization History  Administered Date(s) Administered  . Influenza Whole 11/08/2006  . Influenza,inj,Quad PF,6+ Mos 09/22/2012, 09/20/2013, 09/12/2014, 11/05/2015, 10/07/2016  . Pneumococcal Conjugate-13 04/24/2015  . Pneumococcal Polysaccharide-23 04/24/2016  . Td 08/06/2005  . Zoster 02/23/2012    Qualifies for Shingles Vaccine?Patient will call insurance company to see if they will cover this  vaccine.  Screening Tests Health Maintenance  Topic Date Due  . TETANUS/TDAP  12/08/2017 (Originally 08/07/2015)  . MAMMOGRAM  05/30/2021 (Originally 07/24/2012)  . INFLUENZA VACCINE  08/13/2017  . COLONOSCOPY  05/06/2021  . DEXA SCAN  Completed  . Hepatitis C Screening  Completed  . PNA vac Low Risk Adult  Completed    Cancer Screenings: Lung: Low Dose CT Chest recommended if Age 13-80 years, 30 pack-year currently smoking OR have quit w/in 15years. Patient does not qualify. Breast:  Up to date on Mammogram? Yes   Up to date of Bone Density/Dexa? Yes Colorectal: Due 2023      Plan:   I have personally reviewed and noted the following in the patient's chart:   . Medical and social history . Use of alcohol, tobacco or illicit drugs  . Current medications and supplements . Functional ability and status . Nutritional status . Physical activity . Advanced directives . List of other physicians . Hospitalizations, surgeries, and ER visits in previous 12 months . Vitals . Screenings to include cognitive, depression, and falls . Referrals and appointments  In addition, I have reviewed and discussed with patient certain preventive protocols, quality metrics, and best practice recommendations. A written personalized care plan for preventive services as well as general preventive health recommendations were provided to patient.     Tod Persia, [place]  05/08/2017

## 2017-06-02 ENCOUNTER — Ambulatory Visit: Payer: Medicare Other | Admitting: Family Medicine

## 2017-06-02 ENCOUNTER — Encounter: Payer: Self-pay | Admitting: Family Medicine

## 2017-06-02 VITALS — BP 104/64 | HR 68 | Resp 16 | Ht 65.0 in | Wt 164.0 lb

## 2017-06-02 DIAGNOSIS — S30860A Insect bite (nonvenomous) of lower back and pelvis, initial encounter: Secondary | ICD-10-CM

## 2017-06-02 DIAGNOSIS — W57XXXA Bitten or stung by nonvenomous insect and other nonvenomous arthropods, initial encounter: Secondary | ICD-10-CM | POA: Diagnosis not present

## 2017-06-02 DIAGNOSIS — R1013 Epigastric pain: Secondary | ICD-10-CM | POA: Diagnosis not present

## 2017-06-02 DIAGNOSIS — T63301A Toxic effect of unspecified spider venom, accidental (unintentional), initial encounter: Secondary | ICD-10-CM | POA: Diagnosis not present

## 2017-06-02 MED ORDER — DOXYCYCLINE HYCLATE 100 MG PO TABS: 100.0000 mg | ORAL_TABLET | Freq: Two times a day (BID) | ORAL | 0 refills | Status: DC

## 2017-06-02 NOTE — Patient Instructions (Addendum)
F/u as before, call if you need me sooner   You have antibiotic sent to your home because of recent insect bites  Please continue to check your skin and remove all ticks/ insects when you get bitten   Tick Bite Information, Adult Ticks are insects that can bite. Most ticks live in shrubs and grassy areas. They climb onto people and animals that go by. Then they bite. Some ticks carry germs that can make you sick. How can I prevent tick bites?  Use an insect repellent that has 20% or higher of the ingredients DEET, picaridin, or IR3535. Put this insect repellent on: ? Bare skin. ? The tops of your boots. ? Your pant legs. ? The ends of your sleeves.  If you use an insect repellent that has the ingredient permethrin, make sure to follow the instructions on the bottle. Treat the following: ? Clothing. ? Supplies. ? Boots. ? Tents.  Wear long sleeves, long pants, and light colors.  Tuck your pant legs into your socks.  Stay in the middle of the trail.  Try not to walk through long grass.  Before going inside your house, check your clothes, hair, and skin for ticks. Make sure to check your head, neck, armpits, waist, groin, and joint areas.  Check for ticks every day.  When you come indoors: ? Wash your clothes right away. ? Shower right away. ? Dry your clothes in a dryer on high heat for 60 minutes or more. What is the right way to remove a tick? Remove a tick from your skin as soon as possible.  To remove a tick that is crawling on your skin: ? Go outdoors and brush the tick off. ? Use tape or a lint roller.  To remove a tick that is biting: ? Wash your hands. ? If you have latex gloves, put them on. ? Use tweezers, curved forceps, or a tick-removal tool to grasp the tick. Grasp the tick as close to your skin and as close to the tick's head as possible. ? Gently pull up until the tick lets go.  Try to keep the tick's head attached to its body.  Do not twist or jerk  the tick.  Do not squeeze or crush the tick.  Do not try to remove a tick with heat, alcohol, petroleum jelly, or fingernail polish. How should I get rid of a tick? Here are some ways to get rid of a tick that is alive:  Place the tick in rubbing alcohol.  Place the tick in a bag or container you can close tightly.  Wrap the tick tightly in tape.  Flush the tick down the toilet.  Contact a doctor if:  You have symptoms of a disease, such as: ? Pain in a muscle, joint, or bone. ? Trouble walking or moving your legs. ? Numbness in your legs. ? Inability to move (paralysis). ? A red rash that makes a circle (bull's-eye rash). ? Redness and swelling where the tick bit you. ? A fever. ? Throwing up (vomiting) over and over. ? Diarrhea. ? Weight loss. ? Tender and swollen lymph glands. ? Shortness of breath. ? Cough. ? Belly pain (abdominal pain). ? Headache. ? Being more tired than normal. ? A change in how alert (conscious) you are. ? Confusion. Get help right away if:  You cannot remove a tick.  A part of a tick breaks off and gets stuck in your skin.  You are feeling worse. Summary  Ticks may carry germs that can make you sick.  To prevent tick bites, wear long sleeves, long pants, and light colors. Use insect repellent. Follow the instructions on the bottle.  If the tick is biting, do not try to remove it with heat, alcohol, petroleum jelly, or fingernail polish.  Use tweezers, curved forceps, or a tick-removal tool to grasp the tick. Gently pull up until the tick lets go. Do not twist or jerk the tick. Do not squeeze or crush the tick.  If you have symptoms, contact a doctor. This information is not intended to replace advice given to you by your health care provider. Make sure you discuss any questions you have with your health care provider. Document Released: 03/26/2009 Document Revised: 04/11/2016 Document Reviewed: 04/11/2016 Elsevier Interactive Patient  Education  2018 [employer].

## 2017-06-07 ENCOUNTER — Encounter: Payer: Self-pay | Admitting: Family Medicine

## 2017-06-07 DIAGNOSIS — T63301A Toxic effect of unspecified spider venom, accidental (unintentional), initial encounter: Secondary | ICD-10-CM | POA: Insufficient documentation

## 2017-06-07 DIAGNOSIS — W57XXXA Bitten or stung by nonvenomous insect and other nonvenomous arthropods, initial encounter: Secondary | ICD-10-CM

## 2017-06-07 DIAGNOSIS — S30860A Insect bite (nonvenomous) of lower back and pelvis, initial encounter: Secondary | ICD-10-CM | POA: Insufficient documentation

## 2017-06-07 NOTE — Progress Notes (Signed)
   Gina Nelson     MRN: [id number]      DOB: 09-18-1950   HPI Gina Nelson is here with a c/o possible spider bite on her left ankle which made th foot swell excessively  And also a few days before this she removed a tick from her buttock.sttees no pain , redness or drainage from area where tick was removed  SHe denies systemic symptoms, specifically denies fever , chills , muscle aches, however, is concerned about the fact that the area around the ankle still itches and is red Otherwise she is doing well, has regained her appetite and her weight   ROS Denies recent fever or chills. Denies sinus pressure, nasal congestion, ear pain or sore throat. Denies chest congestion, productive cough or wheezing. Denies chest pains, palpitations and leg swelling Denies abdominal pain, nausea, vomiting,diarrhea or constipation.   Denies dysuria, frequency, hesitancy or incontinence. Denies joint pain, swelling and limitation in mobility. Denies headaches, seizures, numbness, or tingling.    PE  BP 104/64   Pulse 68   Resp 16   Ht 5\' 5"  (1.651 m)   Wt 164 lb (74.4 kg)   SpO2 98%   BMI 27.29 kg/m   Patient alert and oriented and in no cardiopulmonary distress.  HEENT: No facial asymmetry, EOMI,   oropharynx pink and moist.  Neck supple no JVD, no mass.  Chest: Clear to auscultation bilaterally.  CVS: S1, S2 no murmurs, no S3.Regular rate.  ABD: Soft non tender.   Ext: No edema  MS: Adequate ROM spine, shoulders, hips and knees.  Skin: puncture wound noted on left ankle with mild erythema and macular rash diameter max approximately 3cm,  Buttock not examined Psych: Good eye contact, normal affect. Memory intact not anxious or depressed appearing.  CNS: CN 2-12 intact, power,  normal throughout.no focal deficits noted.   Assessment & Plan  Tick bite of buttock Recent tick removal from buttock, doxycycline prescribed as also has current reaction to spider bite on left  ankle  Spider bite allergy, current reaction 2 day hg/o swelling and redness of left ankle associated with spider bite, doxycycline and pt education  Dyspepsia Improved on PPI

## 2017-06-07 NOTE — Assessment & Plan Note (Signed)
Recent tick removal from buttock, doxycycline prescribed as also has current reaction to spider bite on left ankle

## 2017-06-07 NOTE — Assessment & Plan Note (Addendum)
2 day hg/o swelling and redness of left ankle associated with spider bite, doxycycline and pt education, may also use topical benadryl cream

## 2017-06-07 NOTE — Assessment & Plan Note (Signed)
Improved on PPI

## 2017-06-26 ENCOUNTER — Telehealth: Payer: Self-pay | Admitting: Family Medicine

## 2017-06-26 NOTE — Telephone Encounter (Signed)
NO not the estrogen cream but an OTC lubricant would be my best recommendation for her with hr breast  Cancer history , like REPLENS

## 2017-06-26 NOTE — Telephone Encounter (Signed)
Not sure which premarin cream they will cover , can you check, pls let pt know you are working on this and may not be able to complete today if that is the case I will historically enter a cream for you to start with

## 2017-06-26 NOTE — Telephone Encounter (Signed)
Patient having tenderness around her vagina after intercourse. Requesting a cream that her insurance will cover. Please send to [facility].

## 2017-06-30 NOTE — Telephone Encounter (Signed)
Called patient and left message for them to return call at the office   

## 2017-07-07 NOTE — Telephone Encounter (Signed)
Called patient and left message for them to return call at the office   

## 2017-08-24 ENCOUNTER — Ambulatory Visit (HOSPITAL_COMMUNITY)
Admission: RE | Admit: 2017-08-24 | Discharge: 2017-08-24 | Disposition: A | Discharge status: Home / Self Care | Payer: Medicare Other | Source: Ambulatory Visit | Attending: Family Medicine | Admitting: Family Medicine

## 2017-08-24 ENCOUNTER — Encounter: Payer: Self-pay | Admitting: Family Medicine

## 2017-08-24 ENCOUNTER — Ambulatory Visit (INDEPENDENT_AMBULATORY_CARE_PROVIDER_SITE_OTHER): Payer: Medicare Other | Admitting: Family Medicine

## 2017-08-24 ENCOUNTER — Other Ambulatory Visit: Payer: Self-pay

## 2017-08-24 VITALS — BP 110/54 | HR 56 | Resp 12 | Ht 65.0 in | Wt 159.1 lb

## 2017-08-24 DIAGNOSIS — M545 Low back pain, unspecified: Secondary | ICD-10-CM | POA: Insufficient documentation

## 2017-08-24 DIAGNOSIS — M47816 Spondylosis without myelopathy or radiculopathy, lumbar region: Secondary | ICD-10-CM | POA: Diagnosis not present

## 2017-08-24 MED ORDER — PREDNISONE 10 MG PO TABS: ORAL_TABLET | ORAL | 0 refills | Status: DC

## 2017-08-24 MED ORDER — KETOROLAC TROMETHAMINE 60 MG/2ML IM SOLN
60.0000 mg | Freq: Once | INTRAMUSCULAR | Status: AC
  Administered 2017-08-24: 60 mg via INTRAMUSCULAR

## 2017-08-24 MED ORDER — METHYLPREDNISOLONE ACETATE 80 MG/ML IJ SUSP
80.0000 mg | Freq: Once | INTRAMUSCULAR | Status: AC
  Administered 2017-08-24: 80 mg via INTRAMUSCULAR

## 2017-08-24 MED ORDER — IBUPROFEN 600 MG PO TABS: ORAL_TABLET | ORAL | 0 refills | Status: DC

## 2017-08-24 NOTE — Assessment & Plan Note (Signed)
3 week h/o LBP radiating to buttock Uncontrolled.Toradol and depo medrol administered IM in the office , to be followed by a short course of oral prednisone and NSAIDS.

## 2017-08-24 NOTE — Patient Instructions (Addendum)
F/U as before, call if you need me sooner  Please get X ray of your low back today, I believe you have bad arthritis in your spine causing the pain   Toradol and depo Medrol injections in office today and medicaion sent to your local pharmacy  Please be careful not to fall

## 2017-08-31 ENCOUNTER — Encounter: Payer: Self-pay | Admitting: Family Medicine

## 2017-08-31 NOTE — Progress Notes (Signed)
   THURMA PRIEGO     MRN: [id number]      DOB: April 26, 1950   HPI Ms. Folsom is here with c/o bilateral disabling hip pain which has worsened in the past  3 weeks making walking extremely difficult, there is no recent h/o physical trauma , has not had pain like this in the past.  Denies lower extremity weakness or numbness and also denies incontinence C/o increased stress and anxiety as she has a sibling very ill with recurrent cancer x 4 months ROS Denies recent fever or chills. Denies sinus pressure, nasal congestion, ear pain or sore throat. Denies chest congestion, productive cough or wheezing. Denies chest pains, palpitations and leg swelling Denies abdominal pain, nausea, vomiting,diarrhea or constipation.   Denies dysuria, frequency, hesitancy or incontinence.  Denies skin break down or rash.   PE  BP (!) 110/54 (BP Location: Left Arm, Patient Position: Sitting, Cuff Size: Normal)   Pulse (!) 56   Resp 12   Ht 5\' 5"  (1.651 m)   Wt 159 lb 1.9 oz (72.2 kg)   SpO2 99% Comment: room air  BMI 26.48 kg/m   Patient alert and oriented and in no cardiopulmonary distress.Pt in pin an barely able to walk as a result  HEENT: No facial asymmetry, EOMI,   oropharynx pink and moist.  Neck supple no JVD, no mass.  Chest: Clear to auscultation bilaterally.  CVS: S1, S2 no murmurs, no S3.Regular rate.  ABD: Soft non tender.   Ext: No edema  MS: decreased  ROM lumbar spine, tender over SI joints, adeuate in shoulders,  and knees.  Skin: Intact, no ulcerations or rash noted.  Psych: Good eye contact, normal affect. Memory intact not anxious or depressed appearing.  CNS: CN 2-12 intact, power,  normal throughout.no focal deficits noted.   Assessment & Plan  Lumbar spine pain 3 week h/o LBP radiating to buttock Uncontrolled.Toradol and depo medrol administered IM in the office , to be followed by a short course of oral prednisone and NSAIDS.

## 2017-10-01 ENCOUNTER — Ambulatory Visit (INDEPENDENT_AMBULATORY_CARE_PROVIDER_SITE_OTHER): Payer: Medicare Other

## 2017-10-01 DIAGNOSIS — Z23 Encounter for immunization: Secondary | ICD-10-CM

## 2017-11-08 NOTE — Progress Notes (Signed)
ID: Gina Nelson   DOB: 01/14/1950  MR#: [id number]  CSN#:[id number]  Patient Care Team: Gina Helper, MD as PCP - General Gina Confer, MD as Consulting Physician (General Surgery) Gina Kind, MD as Consulting Physician (Obstetrics and Gynecology) Gina Nelson, Gina Dad, MD as Consulting Physician (Oncology) Gina Headings, DO as Consulting Physician (Optometry) Gina Nelson, Gina Dawley, MD as Consulting Physician (Gastroenterology)  CHIEF COMPLAINT: right breast cancer  CURRENT TREATMENT: tamoxifen   BREAST CANCER HISTORY: From the original intake note:  The patient herself noted a mass in her right breast.  She brought it to Dr. Griffin Nelson attention and worked her up for diagnostic mammography and ultrasonography December 29.  Dr. Miquel Nelson was able to palpate a discrete mass in the right breast associated with skin thickening.  By mammography, there was an obscured mass associated with distortion in that area by ultrasound.  The mass was irregular and hypoechoic measuring 1.9 cm.  In the right axilla, there was an enlarged lymph node measuring 2.1 cm.  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  This appeared to be low grade and strongly ER positive at 100%, PR was 34%, the MIB-1 was 41% and there was no evidence of HER2/neu amplification by CISH with a ratio of 1.58.  With this information, the patient was referred to Dr. Zella Nelson and bilateral breast MRIs were obtained on January 11.  The MRI found a 4.9 cm lobulated, irregular mass with spiculated margins in the right breast at the 7 o'clock location with some skin and trabecular enhancement. There was linear enhancement extending to the nipple, but no apparent extension to the pectoralis muscle.  There were two lymph nodes noted in the  right axilla, one measuring 8 mm, the other 6 mm.  There was no internal mammary adenopathy noted and the left breast was unremarkable.  Her subsequent history is as detailed below  INTERVAL HISTORY: Gina Nelson returns today for follow-up and treatment of her estrogen receptor positive breast cancer. She continues on tamoxifen, with good tolerance. She denies hot flashes or increase in vaginal discharge. She takes Gabapentin at night.   Since her last visit to the office, she had an endoscopy with a stomach biopsy ([id number]) on 12/12/2016 that showed:  benign gastric mucosa. No helicobacter pylori, intestinal metaplasia or active inflammation identified.     REVIEW OF SYSTEMS: Gina Nelson reports that for exercise, she has been busy with her husband and church events. One of the recent events, her and her husband fed over 400 individuals, which she is pleased with. She notes that she has been going through a lot since the beginning of this year, who had a recurrence of her cancer following 16 years of remission. She reports that she has been a caregiver of her sister as well. She has arthritic back pain that she takes prednisone and OTC Bayer Back and Body with relief of her symptoms. She denies unusual headaches, visual changes, nausea, vomiting, or dizziness. There has been no unusual cough, phlegm production, or pleurisy. This been no change in bowel or bladder habits. She denies unexplained fatigue or unexplained weight loss, bleeding, rash, or fever. A detailed review of systems was otherwise stable.    PAST MEDICAL HISTORY: Past Medical History:  Diagnosis Date  . Allergy   . Arthritis  knee  . Blood transfusion   . Cancer [facility])    right breast T1cN2  . PONV (postoperative nausea and vomiting)     PAST SURGICAL HISTORY: Past Surgical History:  Procedure Laterality Date  . ABDOMINAL HYSTERECTOMY  1984  . BIOPSY  12/12/2016   Procedure: BIOPSY;  Surgeon: Gina Houston, MD;  [facility]  [facility];  Service: Endoscopy;;  gastric  . BREAST SURGERY  right MRM, left simple mastectomy   2012  . BREAST SURGERY  left mastectomy   2012  . COLONOSCOPY  05/07/2011   Procedure: COLONOSCOPY;  Surgeon: Gina Houston, MD;  Location: [facility];  Service: Endoscopy;  Laterality: N/A;  830  . ESOPHAGOGASTRODUODENOSCOPY N/A 12/12/2016   Procedure: ESOPHAGOGASTRODUODENOSCOPY (EGD);  Surgeon: Gina Houston, MD;  Location: [facility];  Service: Endoscopy;  Laterality: N/A;  9:15  . JOINT REPLACEMENT  11/2009   right knee  . PORT-A-CATH REMOVAL  07/16/10  . TOTAL KNEE ARTHROPLASTY  2011    FAMILY HISTORY Family History  Problem Relation Age of Onset  . Prostate cancer Father   . Pancreatic cancer Sister   . Pneumonia Mother   . Hypertension Daughter   The patient's father had prostate cancer metastatic to bone. He died at the age of 69. There is no other history of cancer in the family to the patient's knowledge.  GYNECOLOGIC HISTORY: Menarche age 62. She stopped having periods of course with her hysterectomy in 1984. She is Gx, P1. She was 18 at the time of that delivery. She used hormone replacement for about two and a half years.  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 Her husband, Savilla Nelson, is a retired Airline pilot and currently a Curator. Daughter, Gina Nelson, keeps a home for handicapped girls. Her husband, Gina Nelson, works in the Therapist, sports business. The patient has one granddaughter who is in her late teens.    ADVANCED DIRECTIVES:  HEALTH MAINTENANCE: Social History   Tobacco Use  . Smoking status: Never Smoker  . Smokeless tobacco: Never Used  Substance Use Topics  . Alcohol use: No  . Drug use: No    Allergies  Allergen Reactions  . Codeine Itching    Arms and face    Current Outpatient Medications  Medication Sig Dispense Refill  . acetaminophen  (TYLENOL) 500 MG tablet Take 1,000 mg by mouth 2 (two) times daily as needed for headache.     Marland Kitchen aspirin 81 MG tablet Take 81 mg by mouth at bedtime.     . gabapentin (NEURONTIN) 100 MG capsule TAKE ONE CAPSULE BY MOUTH AT BEDTIME 90 capsule 1  . ibuprofen (ADVIL,MOTRIN) 600 MG tablet Take one tablet three times daily for 1 week , then as needed 30 tablet 0  . Multiple Vitamins-Minerals (MULTIVITAL) CHEW Chew 1 tablet by mouth daily.    . pantoprazole (PROTONIX) 40 MG tablet Take 1 tablet (40 mg total) by mouth daily before breakfast. 30 tablet 5  . predniSONE (DELTASONE) 10 MG tablet Take one tablet 3 times daily for 3 days, then one tablet two times daily for 3 days, then one tablet once daily for 3 days 18 tablet 0  . tamoxifen (NOLVADEX) 20 MG tablet Take 1 tablet (20 mg total) by mouth daily. (Patient taking differently: Take 20 mg by mouth at bedtime. ) 90 tablet 4   No current facility-administered medications for this visit.  OBJECTIVE: Middle-aged [place] American woman in no acute distress  Vitals:   11/09/17 1140  BP: (!) 124/53  Pulse: (!) 59  Resp: 18  Temp: 98.4 F (36.9 C)  SpO2: 99%     Body mass index is 26.38 kg/m.    ECOG FS: 1  Sclerae unicteric, pupils round and equal Oropharynx clear and moist No cervical or supraclavicular adenopathy Lungs no rales or rhonchi Heart regular rate and rhythm Abd soft, nontender, positive bowel sounds MSK no focal spinal tenderness, no upper extremity lymphedema Neuro: nonfocal, well oriented, appropriate affect Breasts: Status post bilateral mastectomies.  She also received radiation to the right chest wall.  There is no evidence of disease recurrence.  Both axillae are benign.   LAB RESULTS: Lab Results  Component Value Date   WBC PENDING 11/09/2017   NEUTROABS PENDING 11/09/2017   HGB 11.2 (L) 11/09/2017   HCT 36.8 11/09/2017   MCV 88.0 11/09/2017   PLT 178 11/09/2017      Chemistry      Component Value  Date/Time   NA 142 12/08/2016 1228   K 3.7 12/08/2016 1228   CL 106 10/16/2016 1220   CL 109 (H) 11/27/2011 1454   CO2 25 12/08/2016 1228   BUN 10.0 12/08/2016 1228   CREATININE 0.9 12/08/2016 1228      Component Value Date/Time   CALCIUM 9.2 12/08/2016 1228   ALKPHOS 89 12/08/2016 1228   AST 17 12/08/2016 1228   ALT 8 12/08/2016 1228   BILITOT 0.31 12/08/2016 1228       Lab Results  Component Value Date   LABCA2 25 11/27/2011    No components found for: [id number]  No results for input(s): INR in the last 168 hours.  Urinalysis    Component Value Date/Time   COLORURINE YELLOW 11/13/2013 1430   APPEARANCEUR CLEAR 11/13/2013 1430   LABSPEC 1.010 11/13/2013 1430   PHURINE 6.5 11/13/2013 1430   GLUCOSEU NEGATIVE 11/13/2013 1430   HGBUR NEGATIVE 11/13/2013 1430   HGBUR negative 02/21/2009 1509   BILIRUBINUR small 07/22/2016 1037   KETONESUR NEGATIVE 11/13/2013 1430   PROTEINUR 100 07/22/2016 1037   PROTEINUR NEGATIVE 11/13/2013 1430   UROBILINOGEN 0.2 07/22/2016 1037   UROBILINOGEN 0.2 11/13/2013 1430   NITRITE neg 07/22/2016 1037   NITRITE NEGATIVE 11/13/2013 1430   LEUKOCYTESUR Trace (A) 07/22/2016 1037    STUDIES: No results found.  ASSESSMENT: 67 y.o.  [place] woman status post right breast and axillary lymph node biopsy January 2012, both positive for an invasive ductal carcinoma, grade 3, measuring 4.9 cm by MRI, and so stage IIB/IIIA clinically. The tumor was ER positive, PR positive, HER2 negative, with an elevated MIB-1.   (1) She received neoadjuvant docetaxel/ cyclophosphamide x4 followed by doxorubicin/ cyclophosphamide x1, at which point neoadjuvant treatment was discontinued because of poor tolerance  (2) she underwent right modified radical mastectomy with left prophylactic mastectomy June 2012. The left side was benign. On the right, she had 2 areas of residual tumor, one measuring 1.1 cm, the other less than a centimeter, both grade 3, involving 4  out of 13 lymph nodes sampled. Repeat HER2 was again negative, and repeat estrogen and progesterone receptors were again positive at 94% and 83%, respectively.   (3) adjuvant radiation treatments completed September of 2012, at which time she started letrozole, discontinued because of arthralgias/myalgias  (4) tamoxifen started February 2013, to be continued for a total of 10 years  (5) right mid lobe thyroid nodule, requiring annual  follow-up  (a) ultrasound of the thyroid 10/30/2016 shows a 1.4 cm right nodule requiring annual follow-up   PLAN:  Gina Nelson is now a little over 7 years out from definitive surgery for her breast cancer with no evidence of disease recurrence.  This is very favorable.  She is tolerating tamoxifen well and the plan is to continue that for a total of 10 years.  I was sorry to hear that her sister's pancreatic has recurred.  It sounds like referral to hospice would be appropriate at this point.  Magdalina does have a thyroid nodule that requires follow-up and I wrote for the ultrasound to be done within the next week or 2. 2.gcmend I encouraged her to get into "my chart" so she can read her labs and other medical records without having to wait for her doctors to call her.  Otherwise she will see me again in 1 year.  She knows to call for any problems that may develop before the next visit.  Chanel Mckesson, Gina Dad, MD  11/09/17 11:58 AM Medical Oncology and Hematology [facility] [address] [place], [place] [address] Tel. [phone]    Fax. [phone]    I, Soijett Blue am acting as scribe for Dr. Sarajane Jews C. Zykira Matlack.  I, Lurline Del MD, have reviewed the above documentation for accuracy and completeness, and I agree with the above.

## 2017-11-09 ENCOUNTER — Inpatient Hospital Stay: Payer: Medicare Other | Attending: Oncology | Admitting: Oncology

## 2017-11-09 ENCOUNTER — Telehealth: Payer: Self-pay | Admitting: Oncology

## 2017-11-09 ENCOUNTER — Inpatient Hospital Stay: Payer: Medicare Other

## 2017-11-09 VITALS — BP 124/53 | HR 59 | Temp 98.4°F | Resp 18 | Ht 65.0 in | Wt 158.5 lb

## 2017-11-09 DIAGNOSIS — E041 Nontoxic single thyroid nodule: Secondary | ICD-10-CM | POA: Diagnosis not present

## 2017-11-09 DIAGNOSIS — C50511 Malignant neoplasm of lower-outer quadrant of right female breast: Secondary | ICD-10-CM | POA: Diagnosis not present

## 2017-11-09 DIAGNOSIS — Z79899 Other long term (current) drug therapy: Secondary | ICD-10-CM | POA: Insufficient documentation

## 2017-11-09 DIAGNOSIS — Z170 Estrogen receptor positive status [ER+]: Secondary | ICD-10-CM | POA: Diagnosis not present

## 2017-11-09 DIAGNOSIS — C50811 Malignant neoplasm of overlapping sites of right female breast: Secondary | ICD-10-CM

## 2017-11-09 DIAGNOSIS — C773 Secondary and unspecified malignant neoplasm of axilla and upper limb lymph nodes: Secondary | ICD-10-CM | POA: Insufficient documentation

## 2017-11-09 DIAGNOSIS — Z9013 Acquired absence of bilateral breasts and nipples: Secondary | ICD-10-CM

## 2017-11-09 LAB — COMPREHENSIVE METABOLIC PANEL
ALBUMIN: 3.4 g/dL — AB (ref 3.5–5.0)
ALK PHOS: 93 U/L (ref 38–126)
ALT: 8 U/L (ref 0–44)
ANION GAP: 6 (ref 5–15)
AST: 17 U/L (ref 15–41)
BUN: 8 mg/dL (ref 8–23)
CALCIUM: 9.1 mg/dL (ref 8.9–10.3)
CHLORIDE: 110 mmol/L (ref 98–111)
CO2: 26 mmol/L (ref 22–32)
Creatinine, Ser: 0.84 mg/dL (ref 0.44–1.00)
GFR calc Af Amer: >60 mL/min (ref >60)
GFR calc non Af Amer: >60 mL/min (ref >60)
Glucose, Bld: 77 mg/dL (ref 70–99)
Potassium: 3.7 mmol/L (ref 3.5–5.1)
SODIUM: 142 mmol/L (ref 135–145)
Total Bilirubin: 0.4 mg/dL (ref 0.3–1.2)
Total Protein: 6.8 g/dL (ref 6.5–8.1)

## 2017-11-09 LAB — CBC WITH DIFFERENTIAL/PLATELET
ABS IMMATURE GRANULOCYTES: 0.01 10*3/uL (ref 0.00–0.07)
BASOS ABS: 0 10*3/uL (ref 0.0–0.1)
Basophils Relative: 0 %
EOS ABS: 0.1 10*3/uL (ref 0.0–0.5)
Eosinophils Relative: 2 %
HEMATOCRIT: 36.8 % (ref 36.0–46.0)
HEMOGLOBIN: 11.2 g/dL — AB (ref 12.0–15.0)
IMMATURE GRANULOCYTES: 0 %
LYMPHS ABS: 1.4 10*3/uL (ref 0.7–4.0)
LYMPHS PCT: 23 %
MCH: 26.8 pg (ref 26.0–34.0)
MCHC: 30.4 g/dL (ref 30.0–36.0)
MCV: 88 fL (ref 80.0–100.0)
Monocytes Absolute: 0.5 10*3/uL (ref 0.1–1.0)
Monocytes Relative: 9 %
NEUTROS PCT: 66 %
Neutro Abs: 3.9 10*3/uL (ref 1.7–7.7)
Platelets: 178 10*3/uL (ref 150–400)
RBC: 4.18 MIL/uL (ref 3.87–5.11)
RDW: 15.4 % (ref 11.5–15.5)
WBC: 5.9 10*3/uL (ref 4.0–10.5)
nRBC: 0 % (ref 0.0–0.2)

## 2017-11-09 MED ORDER — GABAPENTIN 100 MG PO CAPS: 200.0000 mg | ORAL_CAPSULE | Freq: Every day | ORAL | 1 refills | Status: DC

## 2017-11-09 MED ORDER — TAMOXIFEN CITRATE 20 MG PO TABS: 20.0000 mg | ORAL_TABLET | Freq: Every day | ORAL | 4 refills | Status: DC

## 2017-11-09 NOTE — Telephone Encounter (Signed)
Gave avs calendar is not printing

## 2017-11-16 ENCOUNTER — Other Ambulatory Visit: Payer: Self-pay | Admitting: Medical Oncology

## 2017-11-16 DIAGNOSIS — E041 Nontoxic single thyroid nodule: Secondary | ICD-10-CM

## 2017-11-18 ENCOUNTER — Ambulatory Visit (HOSPITAL_COMMUNITY)
Admission: RE | Admit: 2017-11-18 | Discharge: 2017-11-18 | Disposition: A | Discharge status: Home / Self Care | Payer: Medicare Other | Source: Ambulatory Visit | Attending: Oncology | Admitting: Oncology

## 2017-11-18 DIAGNOSIS — Z170 Estrogen receptor positive status [ER+]: Secondary | ICD-10-CM | POA: Insufficient documentation

## 2017-11-18 DIAGNOSIS — C50811 Malignant neoplasm of overlapping sites of right female breast: Secondary | ICD-10-CM | POA: Diagnosis not present

## 2017-11-18 DIAGNOSIS — E042 Nontoxic multinodular goiter: Secondary | ICD-10-CM | POA: Insufficient documentation

## 2017-11-18 DIAGNOSIS — E041 Nontoxic single thyroid nodule: Secondary | ICD-10-CM

## 2017-11-23 ENCOUNTER — Encounter: Payer: Self-pay | Admitting: Family Medicine

## 2017-12-09 ENCOUNTER — Encounter: Payer: Medicare Other | Admitting: Family Medicine

## 2017-12-14 ENCOUNTER — Encounter: Payer: Medicare Other | Admitting: Family Medicine

## 2017-12-30 ENCOUNTER — Ambulatory Visit (INDEPENDENT_AMBULATORY_CARE_PROVIDER_SITE_OTHER): Payer: Medicare Other | Admitting: Family Medicine

## 2017-12-30 ENCOUNTER — Encounter: Payer: Self-pay | Admitting: Family Medicine

## 2017-12-30 VITALS — BP 108/56 | HR 71 | Resp 12 | Ht 65.0 in | Wt 159.0 lb

## 2017-12-30 DIAGNOSIS — E049 Nontoxic goiter, unspecified: Secondary | ICD-10-CM

## 2017-12-30 DIAGNOSIS — Z0000 Encounter for general adult medical examination without abnormal findings: Secondary | ICD-10-CM

## 2017-12-30 DIAGNOSIS — E041 Nontoxic single thyroid nodule: Secondary | ICD-10-CM

## 2017-12-30 NOTE — Progress Notes (Signed)
    Gina Nelson     MRN: [id number]      DOB: [birth year]  HPI: Patient is in for annual physical exam. No other health concerns are expressed or addressed at the visit. Recent labs, if available are reviewed. Immunization is reviewed , and  updated if needed.   PE: BP (!) 108/56 (BP Location: Left Arm, Patient Position: Sitting, Cuff Size: Normal)   Pulse 71   Resp 12   Ht 5\' 5"  (1.651 m)   Wt 159 lb 0.6 oz (72.1 kg)   SpO2 99% Comment: room air  BMI 26.47 kg/m  Pleasant  female, alert and oriented x 3, in no cardio-pulmonary distress. Afebrile. HEENT No facial trauma or asymetry. Sinuses non tender.  Extra occullar muscles intact,  External ears normal, tympanic membranes clear. Oropharynx moist, no exudate. Neck: supple, no adenopathy,JVD. Positive  thyromegaly.Right thyroid nodule.No bruits.  Chest: Clear to ascultation bilaterally.No crackles or wheezes. Non tender to palpation  Breast: Bilateral mastectomy, no masses or lumps. No tenderness. No axillary or supraclavicular adenopathy  Cardiovascular system; Heart sounds normal,  S1 and  S2 ,no S3.  No murmur, or thrill. Apical beat not displaced Peripheral pulses normal.  Abdomen: Soft, non tender, no organomegaly or masses. No bruits. Bowel sounds normal. No guarding, tenderness or rebound.   Musculoskeletal exam: Full ROM of spine, hips , shoulders and knees. No deformity ,swelling or crepitus noted. No muscle wasting or atrophy.   Neurologic: Cranial nerves 2 to 12 intact. Power, tone ,sensation and reflexes normal throughout. No disturbance in gait. No tremor.  Skin: Intact, no ulceration, erythema , scaling or rash noted. Pigmentation normal throughout  Psych; Normal mood and affect. Judgement and concentration normal   Assessment & Plan:  Annual physical exam Annual exam as documented. Counseling done  re healthy lifestyle involving commitment to 150 minutes exercise per week, heart  healthy diet, and attaining healthy weight.The importance of adequate sleep also discussed.  Immunization and cancer screening needs are specifically addressed at this visit.   Right thyroid nodule Needs rept thyroid US in 11/2018, will check TSH

## 2017-12-30 NOTE — Patient Instructions (Addendum)
Annual exam with MD in 12 months, call if you need me before  You do need repeat thyroid US in 11/20 to follow up  A  Nodule   Exam today, normal blood pressure and weight is good  Please get TSH today due to goiter, we will send result to you if unable to get you on the phone  It is important that you exercise regularly at least 30 minutes 5 times a week. If you develop chest pain, have severe difficulty breathing, or feel very tired, stop exercising immediately and seek medical attention

## 2017-12-31 LAB — TSH: TSH: 1.01 m[IU]/L (ref 0.40–4.50)

## 2018-01-01 ENCOUNTER — Encounter: Payer: Self-pay | Admitting: Family Medicine

## 2018-01-01 NOTE — Assessment & Plan Note (Signed)
Annual exam as documented. Counseling done  re healthy lifestyle involving commitment to 150 minutes exercise per week, heart healthy diet, and attaining healthy weight.The importance of adequate sleep also discussed.  Immunization and cancer screening needs are specifically addressed at this visit.  

## 2018-01-01 NOTE — Assessment & Plan Note (Signed)
Needs rept thyroid US in 11/2018, will check TSH

## 2018-07-14 ENCOUNTER — Ambulatory Visit: Payer: Medicare Other | Admitting: Family Medicine

## 2018-07-15 ENCOUNTER — Ambulatory Visit: Payer: Medicare Other | Admitting: Family Medicine

## 2018-07-15 ENCOUNTER — Encounter: Payer: Self-pay | Admitting: Family Medicine

## 2018-07-15 ENCOUNTER — Other Ambulatory Visit: Payer: Self-pay

## 2018-07-15 VITALS — BP 110/62 | HR 67 | Temp 98.2°F | Resp 16 | Ht 65.0 in | Wt 161.0 lb

## 2018-07-15 DIAGNOSIS — Z7189 Other specified counseling: Secondary | ICD-10-CM | POA: Diagnosis not present

## 2018-07-15 DIAGNOSIS — F4321 Adjustment disorder with depressed mood: Secondary | ICD-10-CM | POA: Insufficient documentation

## 2018-07-15 NOTE — Patient Instructions (Addendum)
Keep follow up appointments as before, call if you need me sooner  You are having a normal grief reaction to losing a sister very  dear to you, time will help you to get to where you want to be    Continue your regular vitamins  Remember, there are resources available for you as you go through the process  Thanks for choosing [facility], we consider it a privelige to serve you.  Managing Loss, Adult People experience loss in many different ways throughout their lives. Events such as moving, changing jobs, and losing friends can create a sense of loss. The loss may be as serious as a major health change, divorce, death of a pet, or death of a loved one. All of these types of loss are likely to create a physical and emotional reaction known as grief. Grief is the result of a major change or an absence of something or someone that you count on. Grief is a normal reaction to loss. A variety of factors can affect your grieving experience, including:  The nature of your loss.  Your relationship to what or whom you lost.  Your understanding of grief and how to manage it.  Your support system. How to manage lifestyle changes Keep to your normal routine as much as possible.  If you have trouble focusing or doing normal activities, it is acceptable to take some time away from your normal routine.  Spend time with friends and loved ones.  Eat a healthy diet, get plenty of sleep, and rest when you feel tired. How to recognize changes  The way that you deal with your grief will affect your ability to function as you normally do. When grieving, you may experience these changes:  Numbness, shock, sadness, anxiety, anger, denial, and guilt.  Thoughts about death.  Unexpected crying.  A physical sensation of emptiness in your stomach.  Problems sleeping and eating.  Tiredness (fatigue).  Loss of interest in normal activities.  Dreaming about or imagining seeing the person  who died.  A need to remember what or whom you lost.  Difficulty thinking about anything other than your loss for a period of time.  Relief. If you have been expecting the loss for a while, you may feel a sense of relief when it happens. Follow these instructions at home:  Activity Express your feelings in healthy ways, such as:  Talking with others about your loss. It may be helpful to find others who have had a similar loss, such as a support group.  Writing down your feelings in a journal.  Doing physical activities to release stress and emotional energy.  Doing creative activities like painting, sculpting, or playing or listening to music.  Practicing resilience. This is the ability to recover and adjust after facing challenges. Reading some resources that encourage resilience may help you to learn ways to practice those behaviors. General instructions  Be patient with yourself and others. Allow the grieving process to happen, and remember that grieving takes time. ? It is likely that you may never feel completely done with some grief. You may find a way to move on while still cherishing memories and feelings about your loss. ? Accepting your loss is a process. It can take months or longer to adjust.  Keep all follow-up visits as told by your health care provider. This is important. Where to find support To get support for managing loss:  Ask your health care provider for help and recommendations,  such as grief counseling or therapy.  Think about joining a support group for people who are managing a loss. Where to find more information You can find more information about managing loss from:  American Society of Clinical Oncology: www.cancer.net  American Psychological Association: TVStereos.ch Contact a health care provider if:  Your grief is extreme and keeps getting worse.  You have ongoing grief that does not improve.  Your body shows symptoms of grief, such as  illness.  You feel depressed, anxious, or lonely. Get help right away if:  You have thoughts about hurting yourself or others. If you ever feel like you may hurt yourself or others, or have thoughts about taking your own life, get help right away. You can go to your nearest emergency department or call:  Your local emergency services (911 in the [place]  A suicide crisis helpline, such as the [place] at 772 172 6156. This is open 24 hours a day. Summary  Grief is the result of a major change or an absence of someone or something that you count on. Grief is a normal reaction to loss.  The depth of grief and the period of recovery depend on the type of loss and your ability to adjust to the change and process your feelings.  Processing grief requires patience and a willingness to accept your feelings and talk about your loss with people who are supportive.  It is important to find resources that work for you and to realize that people experience grief differently. There is not one grieving process that works for everyone in the same way.  Be aware that when grief becomes extreme, it can lead to more severe issues like isolation, depression, anxiety, or suicidal thoughts. Talk with your health care provider if you have any of these issues. This information is not intended to replace advice given to you by your health care provider. Make sure you discuss any questions you have with your health care provider. Document Released: 05/15/2016 Document Revised: 03/05/2018 Document Reviewed: 05/15/2016 Elsevier Patient Education  [place].

## 2018-07-18 ENCOUNTER — Encounter: Payer: Self-pay | Admitting: Family Medicine

## 2018-07-18 DIAGNOSIS — Z7189 Other specified counseling: Secondary | ICD-10-CM | POA: Insufficient documentation

## 2018-07-18 NOTE — Assessment & Plan Note (Signed)
Twenty two minues of time spent counselling  who was appreciative

## 2018-07-18 NOTE — Assessment & Plan Note (Signed)
Experiencing normal grief reaction to loss of a dear sibling to illness, not unexpected, but painful for living family members Only 2 months since death, pt has good insight and peace with her sister's passing who had made it clear to her that she was ready to go, encouraged her to vent , cry and explained and reviewed the process of grieving Offered link to free individual and group counseling , no interest currently but will keep in mind

## 2018-07-18 NOTE — Progress Notes (Signed)
   GITTY OSTERLUND     MRN: [id number]      DOB: 02/26/1950   HPI Ms. Brennan is here primarily to vent and sek relief, assurance and comfort following the death of a dear sibling 2 months, wo died because of recurrent pancreatic cancer, first dx 20 years ago and who was clearly deteriorating in health and suffering in t her latter days as well as she also expressed she was ready to go Pt states entire family esp her nieces are taking it very hard , she feels she is doing fairly well most of the time but felt the need to schedule to be seen No interest in group or individual therapy at this time but wil keep th option open. Sleep and appetite are fairly good , and she pushes herself to get up and out , despite the current lock down situation There are no other  complaints   ROS Denies recent fever or chills. Denies sinus pressure, nasal congestion, ear pain or sore throat. Denies chest congestion, productive cough or wheezing. Denies chest pains, palpitations and leg swelling Denies abdominal pain, nausea, vomiting,diarrhea or constipation.   Denies dysuria, frequency, hesitancy or incontinence. Denies uncontrolled  joint pain, swelling and limitation in mobility. Denies headaches, seizures, numbness, or tingling.  Denies skin break down or rash.   PE  BP 110/62   Pulse 67   Temp 98.2 F (36.8 C) (Temporal)   Resp 16   Ht 5\' 5"  (1.651 m)   Wt 161 lb (73 kg)   SpO2 98%   BMI 26.79 kg/m   Patient alert and oriented and in no cardiopulmonary distress.  HEENT: No facial asymmetry, EOMI,   oropharynx pink and moist.  Neck supple no JVD, no mass.  Chest: Clear to auscultation bilaterally.  CVS: S1, S2 no murmurs, no S3.Regular rate.  ABD: Soft non tender.   Ext: No edema  MS: Adequate ROM spine, shoulders, hips and knees.  Skin: Intact, no ulcerations or rash noted.  Psych: Good eye contact, sad affect and at times tearful. Memory intact not anxious mildly  depressed  appearing.  CNS: CN 2-12 intact, power,  normal throughout.no focal deficits noted.   Assessment & Plan  Grief Experiencing normal grief reaction to loss of a dear sibling to illness, not unexpected, but painful for living family members Only 2 months since death, pt has good insight and peace with her sister's passing who had made it clear to her that she was ready to go, encouraged her to vent , cry and explained and reviewed the process of grieving Offered link to free individual and group counseling , no interest currently but will keep in mind  Grief counseling Twenty two minues of time spent counselling  who was appreciative

## 2018-07-20 ENCOUNTER — Other Ambulatory Visit: Payer: Self-pay

## 2018-07-20 ENCOUNTER — Ambulatory Visit (INDEPENDENT_AMBULATORY_CARE_PROVIDER_SITE_OTHER): Payer: Medicare Other | Admitting: Family Medicine

## 2018-07-20 ENCOUNTER — Encounter: Payer: Self-pay | Admitting: Family Medicine

## 2018-07-20 VITALS — BP 108/56 | HR 71 | Temp 98.3°F | Ht 65.0 in | Wt 159.0 lb

## 2018-07-20 DIAGNOSIS — Z0000 Encounter for general adult medical examination without abnormal findings: Secondary | ICD-10-CM | POA: Diagnosis not present

## 2018-07-20 NOTE — Patient Instructions (Addendum)
[name] , Thank you for taking time to come for your Medicare Wellness Visit. I appreciate your ongoing commitment to your health goals. Please review the following plan we discussed and let me know if I can assist you in the future.   Please continue to practice social distancing to keep you, your family, and our community safe.  If you must go out, please wear a Mask and practice good handwashing.  Screening recommendations/referrals: Colonoscopy: Due 2023 Mammogram: Need up dated Bone Density: Completed 2012 Recommended yearly ophthalmology/optometry visit for glaucoma screening and checkup Recommended yearly dental visit for hygiene and checkup  Vaccinations: Influenza vaccine: Due 2020 (FALL) Pneumococcal vaccine: Completed Tdap vaccine: Due, check insurance for coverage Shingles vaccine: Completed  Advanced directives: We can discuss this is more detail at an appt for advance directives if you would like.  Conditions/risks identified: FALLS   Next appointment: 01/03/2019   Preventive Care 68 Years and Older, Female Preventive care refers to lifestyle choices and visits with your health care provider that can promote health and wellness. What does preventive care include?  A yearly physical exam. This is also called an annual well check.  Dental exams once or twice a year.  Routine eye exams. Ask your health care provider how often you should have your eyes checked.  Personal lifestyle choices, including:  Daily care of your teeth and gums.  Regular physical activity.  Eating a healthy diet.  Avoiding tobacco and drug use.  Limiting alcohol use.  Practicing safe sex.  Taking low-dose aspirin every day.  Taking vitamin and mineral supplements as recommended by your health care provider. What happens during an annual well check? The services and screenings done by your health care provider during your annual well check will depend on your age, overall  health, lifestyle risk factors, and family history of disease. Counseling  Your health care provider may ask you questions about your:  Alcohol use.  Tobacco use.  Drug use.  Emotional well-being.  Home and relationship well-being.  Sexual activity.  Eating habits.  History of falls.  Memory and ability to understand (cognition).  Work and work Statistician.  Reproductive health. Screening  You may have the following tests or measurements:  Height, weight, and BMI.  Blood pressure.  Lipid and cholesterol levels. These may be checked every 5 years, or more frequently if you are over 28 years old.  Skin check.  Lung cancer screening. You may have this screening every year starting at age 68 if you have a 30-pack-year history of smoking and currently smoke or have quit within the past 15 years.  Fecal occult blood test (FOBT) of the stool. You may have this test every year starting at age 68.  Flexible sigmoidoscopy or colonoscopy. You may have a sigmoidoscopy every 5 years or a colonoscopy every 10 years starting at age 68.  Hepatitis C blood test.  Hepatitis B blood test.  Sexually transmitted disease (STD) testing.  Diabetes screening. This is done by checking your blood sugar (glucose) after you have not eaten for a while (fasting). You may have this done every 1-3 years.  Bone density scan. This is done to screen for osteoporosis. You may have this done starting at age 68.  Mammogram. This may be done every 1-2 years. Talk to your health care provider about how often you should have regular mammograms. Talk with your health care provider about your test results, treatment options, and if necessary, the need for more tests.  Vaccines  Your health care provider may recommend certain vaccines, such as:  Influenza vaccine. This is recommended every year.  Tetanus, diphtheria, and acellular pertussis (Tdap, Td) vaccine. You may need a Td booster every 10 years.   Zoster vaccine. You may need this after age 68.  Pneumococcal 13-valent conjugate (PCV13) vaccine. One dose is recommended after age 68.  Pneumococcal polysaccharide (PPSV23) vaccine. One dose is recommended after age 46. Talk to your health care provider about which screenings and vaccines you need and how often you need them. This information is not intended to replace advice given to you by your health care provider. Make sure you discuss any questions you have with your health care provider. Document Released: 01/26/2015 Document Revised: 09/19/2015 Document Reviewed: 10/31/2014 Elsevier Interactive Patient Education  2017 [place] Prevention in the Home Falls can cause injuries. They can happen to people of all ages. There are many things you can do to make your home safe and to help prevent falls. What can I do on the outside of my home?  Regularly fix the edges of walkways and driveways and fix any cracks.  Remove anything that might make you trip as you walk through a door, such as a raised step or threshold.  Trim any bushes or trees on the path to your home.  Use bright outdoor lighting.  Clear any walking paths of anything that might make someone trip, such as rocks or tools.  Regularly check to see if handrails are loose or broken. Make sure that both sides of any steps have handrails.  Any raised decks and porches should have guardrails on the edges.  Have any leaves, snow, or ice cleared regularly.  Use sand or salt on walking paths during winter.  Clean up any spills in your garage right away. This includes oil or grease spills. What can I do in the bathroom?  Use night lights.  Install grab bars by the toilet and in the tub and shower. Do not use towel bars as grab bars.  Use non-skid mats or decals in the tub or shower.  If you need to sit down in the shower, use a plastic, non-slip stool.  Keep the floor dry. Clean up any water that spills  on the floor as soon as it happens.  Remove soap buildup in the tub or shower regularly.  Attach bath mats securely with double-sided non-slip rug tape.  Do not have throw rugs and other things on the floor that can make you trip. What can I do in the bedroom?  Use night lights.  Make sure that you have a light by your bed that is easy to reach.  Do not use any sheets or blankets that are too big for your bed. They should not hang down onto the floor.  Have a firm chair that has side arms. You can use this for support while you get dressed.  Do not have throw rugs and other things on the floor that can make you trip. What can I do in the kitchen?  Clean up any spills right away.  Avoid walking on wet floors.  Keep items that you use a lot in easy-to-reach places.  If you need to reach something above you, use a strong step stool that has a grab bar.  Keep electrical cords out of the way.  Do not use floor polish or wax that makes floors slippery. If you must use wax, use non-skid floor wax.  Do not have throw rugs and other things on the floor that can make you trip. What can I do with my stairs?  Do not leave any items on the stairs.  Make sure that there are handrails on both sides of the stairs and use them. Fix handrails that are broken or loose. Make sure that handrails are as long as the stairways.  Check any carpeting to make sure that it is firmly attached to the stairs. Fix any carpet that is loose or worn.  Avoid having throw rugs at the top or bottom of the stairs. If you do have throw rugs, attach them to the floor with carpet tape.  Make sure that you have a light switch at the top of the stairs and the bottom of the stairs. If you do not have them, ask someone to add them for you. What else can I do to help prevent falls?  Wear shoes that:  Do not have high heels.  Have rubber bottoms.  Are comfortable and fit you well.  Are closed at the toe. Do  not wear sandals.  If you use a stepladder:  Make sure that it is fully opened. Do not climb a closed stepladder.  Make sure that both sides of the stepladder are locked into place.  Ask someone to hold it for you, if possible.  Clearly mark and make sure that you can see:  Any grab bars or handrails.  First and last steps.  Where the edge of each step is.  Use tools that help you move around (mobility aids) if they are needed. These include:  Canes.  Walkers.  Scooters.  Crutches.  Turn on the lights when you go into a dark area. Replace any light bulbs as soon as they burn out.  Set up your furniture so you have a clear path. Avoid moving your furniture around.  If any of your floors are uneven, fix them.  If there are any pets around you, be aware of where they are.  Review your medicines with your doctor. Some medicines can make you feel dizzy. This can increase your chance of falling. Ask your doctor what other things that you can do to help prevent falls. This information is not intended to replace advice given to you by your health care provider. Make sure you discuss any questions you have with your health care provider. Document Released: 10/26/2008 Document Revised: 06/07/2015 Document Reviewed: 02/03/2014 Elsevier Interactive Patient Education  2017 [employer].

## 2018-07-20 NOTE — Progress Notes (Signed)
Subjective:   Gina Nelson is a 68 y.o. female who presents for Medicare Annual (Subsequent) preventive examination.  Location of Patient: Home Location of Provider: Telehealth Consent was obtain for visit to be over via telehealth.  I verified that I am speaking with the correct person using two identifiers.   Review of Systems:   Cardiac Risk Factors include: advanced age (>71men, >24 women)     Objective:     Vitals: There were no vitals taken for this visit.  There is no height or weight on file to calculate BMI.  Advanced Directives 05/08/2017 12/12/2016 04/24/2016 12/01/2015 10/10/2015 06/25/2015 11/13/2013  Does Patient Have a Medical Advance Directive? No No No No Yes No No  Does patient want to make changes to medical advance directive? No - Patient declined - - - - - -  Would patient like information on creating a medical advance directive? No - Patient declined No - Patient declined No - Patient declined No - patient declined information - No - patient declined information No - patient declined information  Pre-existing out of facility DNR order (yellow form or pink MOST form) - - - - - - -    Tobacco Social History   Tobacco Use  Smoking Status Never Smoker  Smokeless Tobacco Never Used     Counseling given: Not Answered   Clinical Intake:  Pre-visit preparation completed: Yes  Pain : No/denies pain     Nutritional Risks: None Diabetes: No  How often do you need to have someone help you when you read instructions, pamphlets, or other written materials from your doctor or pharmacy?: 1 - Never  Interpreter Needed?: No     Past Medical History:  Diagnosis Date  . Allergy   . Arthritis    knee  . Blood transfusion   . Cancer [facility])    right breast T1cN2  . PONV (postoperative nausea and vomiting)    Past Surgical History:  Procedure Laterality Date  . ABDOMINAL HYSTERECTOMY  1984  . BIOPSY  12/12/2016   Procedure: BIOPSY;  Surgeon:  Rogene Houston, MD;  Location: [facility];  Service: Endoscopy;;  gastric  . BREAST SURGERY  right MRM, left simple mastectomy   2012  . BREAST SURGERY  left mastectomy   2012  . COLONOSCOPY  05/07/2011   Procedure: COLONOSCOPY;  Surgeon: Rogene Houston, MD;  Location: [facility];  Service: Endoscopy;  Laterality: N/A;  830  . ESOPHAGOGASTRODUODENOSCOPY N/A 12/12/2016   Procedure: ESOPHAGOGASTRODUODENOSCOPY (EGD);  Surgeon: Rogene Houston, MD;  Location: [facility];  Service: Endoscopy;  Laterality: N/A;  9:15  . JOINT REPLACEMENT  11/2009   right knee  . PORT-A-CATH REMOVAL  07/16/10  . TOTAL KNEE ARTHROPLASTY  2011   Family History  Problem Relation Age of Onset  . Prostate cancer Father   . Pancreatic cancer Sister   . Pneumonia Mother   . Hypertension Daughter    Social History   Socioeconomic History  . Marital status: Married    Spouse name: Izell Hazen  . Number of children: 1  . Years of education: Not on file  . Highest education level: 12th grade  Occupational History  . Occupation: retired  Scientific laboratory technician  . Financial resource strain: Not hard at all  . Food insecurity    Worry: Never true    Inability: Never true  . Transportation needs    Medical: No    Non-medical: No  Tobacco Use  .  Smoking status: Never Smoker  . Smokeless tobacco: Never Used  Substance and Sexual Activity  . Alcohol use: No  . Drug use: No  . Sexual activity: Yes    Birth control/protection: Surgical  Lifestyle  . Physical activity    Days per week: 0 days    Minutes per session: 0 min  . Stress: Not at all  Relationships  . Social connections    Talks on phone: More than three times a week    Gets together: Once a week    Attends religious service: More than 4 times per year    Active member of club or organization: Yes    Attends meetings of clubs or organizations: Never    Relationship status: Married  Other Topics Concern  . Not on file  Social History Narrative    Has been married 29 years. Husband is a Company secretary. She is retired.Visits sick people in the hospital. Enjoys word search puzzles.    Outpatient Encounter Medications as of 07/20/2018  Medication Sig  . acetaminophen (TYLENOL) 500 MG tablet Take 1,000 mg by mouth 2 (two) times daily as needed for headache.   Marland Kitchen aspirin 81 MG tablet Take 81 mg by mouth at bedtime.   . gabapentin (NEURONTIN) 100 MG capsule Take 2 capsules (200 mg total) by mouth at bedtime.  . Multiple Vitamins-Minerals (MULTIVITAL) CHEW Chew 1 tablet by mouth daily.  . pantoprazole (PROTONIX) 40 MG tablet Take 1 tablet (40 mg total) by mouth daily before breakfast.  . tamoxifen (NOLVADEX) 20 MG tablet Take 1 tablet (20 mg total) by mouth at bedtime.   No facility-administered encounter medications on file as of 07/20/2018.     Activities of Daily Living In your present state of health, do you have any difficulty performing the following activities: 07/20/2018  Vision? (No Data)  Comment wears glasses  Difficulty concentrating or making decisions? N  Walking or climbing stairs? Y  Comment knee replacement in 2011 climbing stairs can be difficult  Dressing or bathing? N  Doing errands, shopping? N  Some recent data might be hidden    Patient Care Team: [name], MD as PCP - General [name], MD as Consulting Physician (General Surgery) [name], MD as Consulting Physician (Obstetrics and Gynecology) Magrinat, Virgie Dad, MD as Consulting Physician (Oncology) Madelin Headings, DO as Consulting Physician (Optometry) [name], Mechele Dawley, MD as Consulting Physician (Gastroenterology)    Assessment:   This is a routine wellness examination for [name].  Exercise Activities and Dietary recommendations Current Exercise Habits: The patient does not participate in regular exercise at present, Exercise limited by: cardiac condition(s);orthopedic condition(s)  Goals    . Exercise 3x per week (30 min per time)      Recommend increasing your routine exercise program at least 5 days a week for 15 minutes at a time as tolerated.         Fall Risk Fall Risk  07/20/2018 07/15/2018 12/30/2017 08/24/2017 06/02/2017  Falls in the past year? 0 0 1 No Yes  Number falls in past yr: 0 0 0 - 1  Injury with Fall? 0 0 0 - -  Risk Factor Category  - - - - -  Risk for fall due to : - - - - -  Follow up - - - - -  Comment - - - - -   Is the patient's home free of loose throw rugs in walkways, pet beds, electrical cords, etc?   yes  Grab bars in the bathroom? yes      Handrails on the stairs?   yes      Adequate lighting?   yes  Timed Get Up and Go performed: n/a telemed visit  Depression Screen PHQ 2/9 Scores 07/20/2018 07/15/2018 12/30/2017 08/24/2017  PHQ - 2 Score 2 0 0 0  PHQ- 9 Score 2 - - -     Cognitive Function     6CIT Screen 07/20/2018 05/08/2017 04/24/2016  What Year? 0 points 0 points 0 points  What month? 0 points 0 points 0 points  What time? 0 points 0 points 0 points  Count back from 20 0 points 0 points 0 points  Months in reverse 0 points 0 points 0 points  Repeat phrase 0 points 0 points 0 points  Total Score 0 0 0    Immunization History  Administered Date(s) Administered  . Influenza Whole 11/08/2006  . Influenza,inj,Quad PF,6+ Mos 09/22/2012, 09/20/2013, 09/12/2014, 11/05/2015, 10/07/2016, 10/01/2017  . Pneumococcal Conjugate-13 04/24/2015  . Pneumococcal Polysaccharide-23 04/24/2016  . Td 08/06/2005  . Zoster 02/23/2012    Qualifies for Shingles Vaccine? Completed 2014  Screening Tests Health Maintenance  Topic Date Due  . TETANUS/TDAP  08/07/2015  . MAMMOGRAM  05/30/2021 (Originally 07/24/2012)  . INFLUENZA VACCINE  08/14/2018  . COLONOSCOPY  05/06/2021  . DEXA SCAN  Completed  . Hepatitis C Screening  Completed  . PNA vac Low Risk Adult  Completed    Cancer Screenings: Lung: Low Dose CT Chest recommended if Age 57-80 years, 30 pack-year currently smoking OR have  quit w/in 15years. Patient does not qualify. Breast:  Up to date on Mammogram? Yes   Up to date of Bone Density/Dexa? Yes Colorectal: Due 2023  Additional Screenings:  Hepatitis C Screening: completed      Plan:     1. Encounter for Medicare annual wellness exam  I have personally reviewed and noted the following in the patient's chart:   . Medical and social history . Use of alcohol, tobacco or illicit drugs  . Current medications and supplements . Functional ability and status . Nutritional status . Physical activity . Advanced directives . List of other physicians . Hospitalizations, surgeries, and ER visits in previous 12 months . Vitals . Screenings to include cognitive, depression, and falls . Referrals and appointments  In addition, I have reviewed and discussed with patient certain preventive protocols, quality metrics, and best practice recommendations. A written personalized care plan for preventive services as well as general preventive health recommendations were provided to patient.   I provided 20 minutes of non-face-to-face time during this encounter.   Perlie Mayo, NP  07/20/2018

## 2018-07-29 ENCOUNTER — Other Ambulatory Visit: Payer: Self-pay

## 2018-07-29 ENCOUNTER — Other Ambulatory Visit: Payer: Medicare Other

## 2018-07-29 DIAGNOSIS — Z20822 Contact with and (suspected) exposure to covid-19: Secondary | ICD-10-CM

## 2018-08-02 LAB — NOVEL CORONAVIRUS, NAA: SARS-CoV-2, NAA: NOT DETECTED

## 2018-08-09 ENCOUNTER — Telehealth: Payer: Self-pay | Admitting: Family Medicine

## 2018-08-09 NOTE — Telephone Encounter (Signed)
Patient informed COVID results

## 2018-09-14 ENCOUNTER — Other Ambulatory Visit: Payer: Self-pay

## 2018-09-14 ENCOUNTER — Ambulatory Visit (INDEPENDENT_AMBULATORY_CARE_PROVIDER_SITE_OTHER): Payer: Medicare Other

## 2018-09-14 ENCOUNTER — Encounter (INDEPENDENT_AMBULATORY_CARE_PROVIDER_SITE_OTHER): Payer: Self-pay

## 2018-09-14 DIAGNOSIS — Z23 Encounter for immunization: Secondary | ICD-10-CM

## 2018-11-09 NOTE — Progress Notes (Signed)
ID: Gina Nelson   DOB: 11-Jun-1950  MR#: [id number]  VZD#:[id number]  Patient Care Team: Gina Helper, MD as PCP - General Gina Confer, MD as Consulting Physician (General Surgery) Gina Kind, MD as Consulting Physician (Obstetrics and Gynecology) Magrinat, Virgie Dad, MD as Consulting Physician (Oncology) Gina Headings, DO as Consulting Physician (Optometry) Rehman, Mechele Dawley, MD as Consulting Physician (Gastroenterology)  CHIEF COMPLAINT: right breast cancer (s/p bilateral mastectomies)  CURRENT TREATMENT: tamoxifen    INTERVAL HISTORY: Gina Nelson returns today for follow-up of her estrogen receptor positive breast cancer.   She continues on tamoxifen.  Hot flashes are the only significant side effect she has experienced from this medication.  She benefits from 200 mg of gabapentin at bedtime.  Since her last visit, she underwent follow up head and neck ultrasound on 11/18/2017 to re-evaluate her multinodular goiter. This showed no new or enlarging thyroid nodule.   REVIEW OF SYSTEMS: Gina Nelson lost her sister to recurrent pancreatic cancer in 05/2018.  This was very rough.  She had extensive surgery last August, then was in a rehab facility, then had recurrence.  She was able to talk to all the family and Gina Nelson group and individually.  Gina Nelson used to talk to her 4 and 5 times a day and now she misses her terribly.  Aside from this issue her some review of systems today was entirely stable   BREAST CANCER HISTORY: From the original intake note:  The patient herself noted a mass in her right breast.  She brought it to Gina Nelson attention and worked her up for diagnostic mammography and ultrasonography December 29.  Gina Nelson was able to palpate a discrete mass in the right breast associated with skin thickening.  By mammography, there was an obscured mass associated with distortion in that area by ultrasound.  The mass was irregular and hypoechoic measuring 1.9 cm.  In the right  axilla, there was an enlarged lymph node measuring 2.1 cm.  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  This appeared to be low grade and strongly ER positive at 100%, PR was 34%, the MIB-1 was 41% and there was no evidence of HER2/neu amplification by CISH with a ratio of 1.58.  With this information, the patient was referred to Gina Nelson and bilateral breast MRIs were obtained on January 11.  The MRI found a 4.9 cm lobulated, irregular mass with spiculated margins in the right breast at the 7 o'clock location with some skin and trabecular enhancement. There was linear enhancement extending to the nipple, but no apparent extension to the pectoralis muscle.  There were two lymph nodes noted in the right axilla, one measuring 8 mm, the other 6 mm.  There was no internal mammary adenopathy noted and the left breast was unremarkable.  Her subsequent history is as detailed below   PAST MEDICAL HISTORY: Past Medical History:  Diagnosis Date  . Allergy   . Arthritis    knee  . Blood transfusion   . Cancer [facility])    right breast T1cN2  . PONV (postoperative nausea and vomiting)     PAST SURGICAL HISTORY: Past Surgical History:  Procedure Laterality Date  . ABDOMINAL HYSTERECTOMY  1984  . BIOPSY  12/12/2016   Procedure: BIOPSY;  Surgeon: Gina Houston, MD;  [facility];  Service: Endoscopy;;  gastric  . BREAST SURGERY  right MRM, left simple mastectomy   2012  . BREAST SURGERY  left mastectomy   2012  . COLONOSCOPY  05/07/2011   Procedure: COLONOSCOPY;  Surgeon: Gina Houston, MD;  [facility];  Service: Endoscopy;  Laterality: N/A;  830  . ESOPHAGOGASTRODUODENOSCOPY N/A 12/12/2016   Procedure: ESOPHAGOGASTRODUODENOSCOPY (EGD);  Surgeon: Gina Houston, MD;   [facility];  Service: Endoscopy;  Laterality: N/A;  9:15  . JOINT REPLACEMENT  11/2009   right knee  . PORT-A-CATH REMOVAL  07/16/10  . TOTAL KNEE ARTHROPLASTY  2011    FAMILY HISTORY Family History  Problem Relation Age of Onset  . Prostate cancer Father   . Pancreatic cancer Sister   . Pneumonia Mother   . Hypertension Daughter   The patient's father had prostate cancer metastatic to bone. He died at the age of 80. She lost a sister to recurrent pancreatic cancer in 05/2018. There is no history of breast or ovarian cancer in the family to the patient's knowledge.   GYNECOLOGIC HISTORY: Menarche age 76. She stopped having periods of course with her hysterectomy in 1984. She is Gx, P1. She was 18 at the time of that delivery. She used hormone replacement for about two and a half years.   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 Her husband, Mickey Hebel, is a retired Airline pilot and currently a Curator. Daughter, Paula Busenbark, keeps a home for handicapped girls. Her husband, [name], works in the Therapist, sports business. The patient has one granddaughter who is in her late teens.    ADVANCED DIRECTIVES:   HEALTH MAINTENANCE: Social History   Tobacco Use  . Smoking status: Never Smoker  . Smokeless tobacco: Never Used  Substance Use Topics  . Alcohol use: No  . Drug use: No    Allergies  Allergen Reactions  . Codeine Itching    Arms and face    Current Outpatient Medications  Medication Sig Dispense Refill  . acetaminophen (TYLENOL) 500 MG tablet Take 1,000 mg by mouth 2 (two) times daily as needed for headache.     Marland Kitchen aspirin 81 MG tablet Take 81 mg by mouth at bedtime.     Marland Kitchen azithromycin (ZITHROMAX) 250 MG tablet Take 2 tablets today, then one tablet daily 6 each 0  . gabapentin (NEURONTIN) 100 MG capsule Take 2 capsules (200 mg total) by mouth at bedtime. 180 capsule 1  .  loratadine (CLARITIN) 10 MG tablet Take 1 tablet (10 mg total) by mouth daily. 90 tablet 0  . Multiple Vitamins-Minerals (MULTIVITAL) CHEW Chew 1 tablet by mouth daily.    . pantoprazole (PROTONIX) 40 MG tablet Take 1 tablet (40 mg total) by mouth daily before breakfast. 30 tablet 5  . tamoxifen (NOLVADEX) 20 MG tablet Take 1 tablet (20 mg total) by mouth at bedtime. 90 tablet 4   No current facility-administered medications for this visit.     OBJECTIVE: Middle-aged [place] American woman who appears stated age  68:   11/10/18 1109  BP: (!) 131/58  Pulse: 65  Resp: 18  Temp: 98.3 F (36.8 C)  SpO2: 100%     Body mass index is 27.61 kg/m.    ECOG FS: 1  Sclerae unicteric, EOMs intact Wearing a mask No cervical or supraclavicular adenopathy Lungs no rales or rhonchi Heart regular rate  and rhythm Abd soft, nontender, positive bowel sounds MSK no focal spinal tenderness, no upper extremity lymphedema Neuro: nonfocal, well oriented, appropriate affect Breasts: She is status post bilateral mastectomies and status post right chest wall radiation.  There are some telangiectasias in the radiation field as expected.  There is no evidence of disease recurrence.  Both axillae are benign.   LAB RESULTS: Lab Results  Component Value Date   WBC 5.3 11/10/2018   NEUTROABS 3.3 11/10/2018   HGB 11.5 (L) 11/10/2018   HCT 37.2 11/10/2018   MCV 86.9 11/10/2018   PLT 191 11/10/2018      Chemistry      Component Value Date/Time   NA 144 11/10/2018 1054   NA 142 12/08/2016 1228   K 3.9 11/10/2018 1054   K 3.7 12/08/2016 1228   CL 109 11/10/2018 1054   CL 109 (H) 11/27/2011 1454   CO2 24 11/10/2018 1054   CO2 25 12/08/2016 1228   BUN 12 11/10/2018 1054   BUN 10.0 12/08/2016 1228   CREATININE 0.88 11/10/2018 1054   CREATININE 0.9 12/08/2016 1228      Component Value Date/Time   CALCIUM 8.9 11/10/2018 1054   CALCIUM 9.2 12/08/2016 1228   ALKPHOS 98 11/10/2018 1054   ALKPHOS  89 12/08/2016 1228   AST 18 11/10/2018 1054   AST 17 12/08/2016 1228   ALT 9 11/10/2018 1054   ALT 8 12/08/2016 1228   BILITOT 0.2 (L) 11/10/2018 1054   BILITOT 0.31 12/08/2016 1228       Lab Results  Component Value Date   LABCA2 25 11/27/2011    No components found for: [id number]  No results for input(s): INR in the last 168 hours.  Urinalysis    Component Value Date/Time   COLORURINE YELLOW 11/13/2013 1430   APPEARANCEUR CLEAR 11/13/2013 1430   LABSPEC 1.010 11/13/2013 1430   PHURINE 6.5 11/13/2013 1430   GLUCOSEU NEGATIVE 11/13/2013 1430   HGBUR NEGATIVE 11/13/2013 1430   HGBUR negative 02/21/2009 1509   BILIRUBINUR small 07/22/2016 1037   KETONESUR NEGATIVE 11/13/2013 1430   PROTEINUR 100 07/22/2016 1037   PROTEINUR NEGATIVE 11/13/2013 1430   UROBILINOGEN 0.2 07/22/2016 1037   UROBILINOGEN 0.2 11/13/2013 1430   NITRITE neg 07/22/2016 1037   NITRITE NEGATIVE 11/13/2013 1430   LEUKOCYTESUR Trace (A) 07/22/2016 1037    STUDIES: No results found.    ASSESSMENT: 68 y.o.  [place] woman status post right breast and axillary lymph node biopsy January 2012, both positive for an invasive ductal carcinoma, grade 3, measuring 4.9 cm by MRI, and so stage IIB/IIIA clinically. The tumor was ER positive, PR positive, HER2 negative, with an elevated MIB-1.   (1) She received neoadjuvant docetaxel/ cyclophosphamide x4 followed by doxorubicin/ cyclophosphamide x1, at which point neoadjuvant treatment was discontinued because of poor tolerance  (2) she underwent right modified radical mastectomy with left prophylactic mastectomy June 2012. The left side was benign. On the right, she had 2 areas of residual tumor, one measuring 1.1 cm, the other less than a centimeter, both grade 3, involving 4 out of 13 lymph nodes sampled. Repeat HER2 was again negative, and repeat estrogen and progesterone receptors were again positive at 94% and 83%, respectively.   (3) adjuvant radiation  treatments completed September of 2012, at which time she started letrozole, discontinued because of arthralgias/myalgias  (4) tamoxifen started February 2013, to be continued for a total of 10 years  (5) right mid lobe thyroid nodule, requiring annual follow-up  (  a) ultrasound of the thyroid 10/30/2016 shows a 1.4 cm right nodule requiring annual follow-up  (b) thyroid ultrasound 10/18/2017 stable   PLAN:  Crystalina is now a little over 8 years out from definitive surgery for her breast cancer with no evidence of disease recurrence.  This is very favorable.  She is tolerating tamoxifen well and the plan is to continue a total of 10 years.  We are also following her thyroid nodule which has been very stable.  We will set her up for repeat thyroid ultrasound before the end of the year.  I expressed our sympathy on the passing of her sister from pancreatic cancer earlier this year.  She knows to call for any other problems that may develop before her next visit.   Magrinat, Virgie Dad, MD  11/10/18 12:40 PM Medical Oncology and Hematology [facility] [address] [place], [place] [address] Tel. [phone]    Fax. [phone]    I, Wilburn Mylar, am acting as scribe for Dr. Virgie Nelson. Magrinat.  I, Lurline Del MD, have reviewed the above documentation for accuracy and completeness, and I agree with the above.

## 2018-11-10 ENCOUNTER — Other Ambulatory Visit: Payer: Self-pay

## 2018-11-10 ENCOUNTER — Inpatient Hospital Stay (HOSPITAL_BASED_OUTPATIENT_CLINIC_OR_DEPARTMENT_OTHER): Payer: Medicare Other | Admitting: Oncology

## 2018-11-10 ENCOUNTER — Ambulatory Visit: Payer: Medicare Other | Admitting: Family Medicine

## 2018-11-10 ENCOUNTER — Inpatient Hospital Stay: Payer: Medicare Other | Attending: Oncology

## 2018-11-10 VITALS — BP 131/58 | HR 65 | Temp 98.3°F | Resp 18 | Ht 65.0 in | Wt 165.9 lb

## 2018-11-10 DIAGNOSIS — C50411 Malignant neoplasm of upper-outer quadrant of right female breast: Secondary | ICD-10-CM | POA: Diagnosis present

## 2018-11-10 DIAGNOSIS — Z9013 Acquired absence of bilateral breasts and nipples: Secondary | ICD-10-CM | POA: Insufficient documentation

## 2018-11-10 DIAGNOSIS — C50811 Malignant neoplasm of overlapping sites of right female breast: Secondary | ICD-10-CM

## 2018-11-10 DIAGNOSIS — C773 Secondary and unspecified malignant neoplasm of axilla and upper limb lymph nodes: Secondary | ICD-10-CM | POA: Insufficient documentation

## 2018-11-10 DIAGNOSIS — N951 Menopausal and female climacteric states: Secondary | ICD-10-CM | POA: Insufficient documentation

## 2018-11-10 DIAGNOSIS — Z170 Estrogen receptor positive status [ER+]: Secondary | ICD-10-CM | POA: Diagnosis not present

## 2018-11-10 DIAGNOSIS — Z800 Family history of malignant neoplasm of digestive organs: Secondary | ICD-10-CM | POA: Diagnosis not present

## 2018-11-10 DIAGNOSIS — E041 Nontoxic single thyroid nodule: Secondary | ICD-10-CM | POA: Diagnosis not present

## 2018-11-10 LAB — COMPREHENSIVE METABOLIC PANEL
ALT: 9 U/L (ref 0–44)
AST: 18 U/L (ref 15–41)
Albumin: 3.7 g/dL (ref 3.5–5.0)
Alkaline Phosphatase: 98 U/L (ref 38–126)
Anion gap: 11 (ref 5–15)
BUN: 12 mg/dL (ref 8–23)
CO2: 24 mmol/L (ref 22–32)
Calcium: 8.9 mg/dL (ref 8.9–10.3)
Chloride: 109 mmol/L (ref 98–111)
Creatinine, Ser: 0.88 mg/dL (ref 0.44–1.00)
GFR calc Af Amer: >60 mL/min (ref >60)
GFR calc non Af Amer: >60 mL/min (ref >60)
Glucose, Bld: 80 mg/dL (ref 70–99)
Potassium: 3.9 mmol/L (ref 3.5–5.1)
Sodium: 144 mmol/L (ref 135–145)
Total Bilirubin: 0.2 mg/dL — ABNORMAL LOW (ref 0.3–1.2)
Total Protein: 7 g/dL (ref 6.5–8.1)

## 2018-11-10 LAB — CBC WITH DIFFERENTIAL/PLATELET
Abs Immature Granulocytes: 0.02 10*3/uL (ref 0.00–0.07)
Basophils Absolute: 0 10*3/uL (ref 0.0–0.1)
Basophils Relative: 0 %
Eosinophils Absolute: 0.1 10*3/uL (ref 0.0–0.5)
Eosinophils Relative: 2 %
HCT: 37.2 % (ref 36.0–46.0)
Hemoglobin: 11.5 g/dL — ABNORMAL LOW (ref 12.0–15.0)
Immature Granulocytes: 0 %
Lymphocytes Relative: 26 %
Lymphs Abs: 1.4 10*3/uL (ref 0.7–4.0)
MCH: 26.9 pg (ref 26.0–34.0)
MCHC: 30.9 g/dL (ref 30.0–36.0)
MCV: 86.9 fL (ref 80.0–100.0)
Monocytes Absolute: 0.4 10*3/uL (ref 0.1–1.0)
Monocytes Relative: 8 %
Neutro Abs: 3.3 10*3/uL (ref 1.7–7.7)
Neutrophils Relative %: 64 %
Platelets: 191 10*3/uL (ref 150–400)
RBC: 4.28 MIL/uL (ref 3.87–5.11)
RDW: 15.5 % (ref 11.5–15.5)
WBC: 5.3 10*3/uL (ref 4.0–10.5)
nRBC: 0 % (ref 0.0–0.2)

## 2018-11-10 MED ORDER — LORATADINE 10 MG PO TABS: 10.0000 mg | ORAL_TABLET | Freq: Every day | ORAL | 0 refills | Status: DC

## 2018-11-10 MED ORDER — GABAPENTIN 100 MG PO CAPS: 200.0000 mg | ORAL_CAPSULE | Freq: Every day | ORAL | 1 refills | Status: DC

## 2018-11-10 MED ORDER — TAMOXIFEN CITRATE 20 MG PO TABS: 20.0000 mg | ORAL_TABLET | Freq: Every day | ORAL | 4 refills | Status: DC

## 2018-11-10 MED ORDER — AZITHROMYCIN 250 MG PO TABS: ORAL_TABLET | ORAL | 0 refills | Status: DC

## 2018-11-11 ENCOUNTER — Telehealth: Payer: Self-pay | Admitting: Oncology

## 2018-11-11 NOTE — Telephone Encounter (Signed)
I talk with patient regarding schedule  

## 2018-12-07 ENCOUNTER — Ambulatory Visit (HOSPITAL_COMMUNITY)
Admission: RE | Admit: 2018-12-07 | Discharge: 2018-12-07 | Disposition: A | Discharge status: Home / Self Care | Payer: Medicare Other | Source: Ambulatory Visit | Attending: Oncology | Admitting: Oncology

## 2018-12-07 ENCOUNTER — Other Ambulatory Visit: Payer: Self-pay

## 2018-12-07 ENCOUNTER — Encounter: Payer: Self-pay | Admitting: Oncology

## 2018-12-07 DIAGNOSIS — C50811 Malignant neoplasm of overlapping sites of right female breast: Secondary | ICD-10-CM | POA: Diagnosis present

## 2018-12-07 DIAGNOSIS — Z170 Estrogen receptor positive status [ER+]: Secondary | ICD-10-CM | POA: Insufficient documentation

## 2018-12-23 ENCOUNTER — Telehealth: Payer: Self-pay | Admitting: *Deleted

## 2018-12-23 ENCOUNTER — Other Ambulatory Visit: Payer: Self-pay

## 2018-12-23 DIAGNOSIS — R7303 Prediabetes: Secondary | ICD-10-CM

## 2018-12-23 DIAGNOSIS — Z13220 Encounter for screening for lipoid disorders: Secondary | ICD-10-CM

## 2018-12-23 DIAGNOSIS — E559 Vitamin D deficiency, unspecified: Secondary | ICD-10-CM

## 2018-12-23 DIAGNOSIS — E041 Nontoxic single thyroid nodule: Secondary | ICD-10-CM

## 2018-12-23 DIAGNOSIS — E049 Nontoxic goiter, unspecified: Secondary | ICD-10-CM

## 2018-12-23 NOTE — Telephone Encounter (Signed)
You may remove HBA1C, thanks!

## 2018-12-23 NOTE — Telephone Encounter (Signed)
Pt has an appt for CPE 12-21 and wanted to know if she needed to get blood work ordered as it has been awhile since she had any

## 2018-12-23 NOTE — Telephone Encounter (Signed)
I ordered labs. Do you want any additional labs ordered?

## 2018-12-23 NOTE — Telephone Encounter (Signed)
Advised patient she needs to have labs drawn and told her not to have A1C drawn with verbal understanding

## 2018-12-27 LAB — CBC: MPV: 12.9 fL — ABNORMAL HIGH (ref 7.5–12.5)

## 2018-12-28 LAB — LIPID PANEL
Cholesterol: 153 mg/dL (ref <200)
HDL: 62 mg/dL (ref >=50)
LDL Cholesterol (Calc): 77 mg/dL (calc)
Non-HDL Cholesterol (Calc): 91 mg/dL (calc) (ref <130)
Total CHOL/HDL Ratio: 2.5 (calc) (ref <5.0)
Triglycerides: 64 mg/dL (ref <150)

## 2018-12-28 LAB — TSH: TSH: 0.98 mIU/L (ref 0.40–4.50)

## 2018-12-28 LAB — COMPLETE METABOLIC PANEL WITH GFR
AG Ratio: 1.4 (calc) (ref 1.0–2.5)
ALT: 8 U/L (ref 6–29)
AST: 17 U/L (ref 10–35)
Albumin: 3.8 g/dL (ref 3.6–5.1)
Alkaline phosphatase (APISO): 93 U/L (ref 37–153)
BUN: 13 mg/dL (ref 7–25)
CO2: 27 mmol/L (ref 20–32)
Calcium: 9.1 mg/dL (ref 8.6–10.4)
Chloride: 109 mmol/L (ref 98–110)
Creat: 0.91 mg/dL (ref 0.50–0.99)
GFR, Est African American: 75 mL/min/{1.73_m2} (ref >=60)
GFR, Est Non African American: 65 mL/min/{1.73_m2} (ref >=60)
Globulin: 2.7 g/dL (calc) (ref 1.9–3.7)
Glucose, Bld: 82 mg/dL (ref 65–99)
Potassium: 3.8 mmol/L (ref 3.5–5.3)
Sodium: 144 mmol/L (ref 135–146)
Total Bilirubin: 0.4 mg/dL (ref 0.2–1.2)
Total Protein: 6.5 g/dL (ref 6.1–8.1)

## 2018-12-28 LAB — CBC
HCT: 36 % (ref 35.0–45.0)
Hemoglobin: 11.5 g/dL — ABNORMAL LOW (ref 11.7–15.5)
MCH: 27.4 pg (ref 27.0–33.0)
MCHC: 31.9 g/dL — ABNORMAL LOW (ref 32.0–36.0)
MCV: 85.7 fL (ref 80.0–100.0)
Platelets: 172 10*3/uL (ref 140–400)
RBC: 4.2 10*6/uL (ref 3.80–5.10)
RDW: 13.5 % (ref 11.0–15.0)
WBC: 6.9 10*3/uL (ref 3.8–10.8)

## 2018-12-28 LAB — VITAMIN D 25 HYDROXY (VIT D DEFICIENCY, FRACTURES): Vit D, 25-Hydroxy: 15 ng/mL — ABNORMAL LOW (ref 30–100)

## 2018-12-30 ENCOUNTER — Encounter: Payer: Self-pay | Admitting: Family Medicine

## 2018-12-30 ENCOUNTER — Other Ambulatory Visit: Payer: Self-pay

## 2018-12-30 ENCOUNTER — Ambulatory Visit (INDEPENDENT_AMBULATORY_CARE_PROVIDER_SITE_OTHER): Payer: Medicare Other | Admitting: Family Medicine

## 2018-12-30 VITALS — BP 131/58 | Ht 65.0 in | Wt 165.0 lb

## 2018-12-30 DIAGNOSIS — J302 Other seasonal allergic rhinitis: Secondary | ICD-10-CM

## 2018-12-30 DIAGNOSIS — R42 Dizziness and giddiness: Secondary | ICD-10-CM | POA: Insufficient documentation

## 2018-12-30 MED ORDER — MECLIZINE HCL 12.5 MG PO TABS: 12.5000 mg | ORAL_TABLET | Freq: Two times a day (BID) | ORAL | 0 refills | Status: DC | PRN

## 2018-12-30 MED ORDER — CHLORPHENIRAMINE MALEATE 4 MG PO TABS: ORAL_TABLET | ORAL | 0 refills | Status: DC

## 2018-12-30 NOTE — Progress Notes (Signed)
Virtual Visit via Telephone Note  I connected with [name] on 12/30/18 at  2:40 PM EST by telephone and verified that I am speaking with the correct person using two identifiers.  Location: Patient: home  Provider: office   I discussed the limitations, risks, security and privacy concerns of performing an evaluation and management service by telephone and the availability of in person appointments. I also discussed with the patient that there may be a patient responsible charge related to this service. The patient expressed understanding and agreed to proceed.   History of Present Illness:   1 day h/o acute vertigo and light headedness, no preceediong uRI or viral illness. Has had tis in the past over 5 years ago. Denies fever , chills, sinus pressure , sore throat , ear pain or productive cough C/o increased nasal congestion and drainage x 1 week.  Denies chest congestion, productive cough or wheezing. Denies chest pains, palpitations and leg swelling Denies abdominal pain, nausea, vomiting,diarrhea or constipation.   Denies dysuria, frequency, hesitancy or incontinence. Denies joint pain, swelling and limitation in mobility. Denies headaches, seizures, numbness, or tingling. Denies depression, anxiety or insomnia. Denies skin break down or rash.     Observations/Objective: BP (!) 131/58   Ht 5\' 5"  (1.651 m)   Wt 165 lb (74.8 kg)   BMI 27.46 kg/m  Good communication with no confusion and intact memory. Alert and oriented x 3 No signs of respiratory distress during speech    Assessment and Plan:  Vertigo Acute onset and disabling, treat with antivert as needed, safety while symptomatic is also discussed  Seasonal allergies Uncontrolled , with nasal congestion, commit to daily Claritin and chlorpheniramine as needed short term   Follow Up Instructions:    I discussed the assessment and treatment plan with the patient. The patient was provided an opportunity  to ask questions and all were answered. The patient agreed with the plan and demonstrated an understanding of the instructions.   The patient was advised to call back or seek an in-person evaluation if the symptoms worsen or if the condition fails to improve as anticipated.  I provided 10 minutes of non-face-to-face time during this encounter.   Tula Nakayama, MD

## 2018-12-30 NOTE — Patient Instructions (Signed)
F/U as before, call if you need me sooner  You are treated for vertigo/ inner ear, and uncontrolled allergic rhinitis  Continue once daily  loratadine , and add the Antivert and chlorpheniramine as needed  Thanks for choosing [facility], we consider it a privelige to serve you.

## 2019-01-02 NOTE — Assessment & Plan Note (Signed)
Acute onset and disabling, treat with antivert as needed, safety while symptomatic is also discussed

## 2019-01-02 NOTE — Assessment & Plan Note (Signed)
Uncontrolled , with nasal congestion, commit to daily Claritin and chlorpheniramine as needed short term

## 2019-01-03 ENCOUNTER — Encounter: Payer: Self-pay | Admitting: Family Medicine

## 2019-01-03 ENCOUNTER — Ambulatory Visit (INDEPENDENT_AMBULATORY_CARE_PROVIDER_SITE_OTHER): Payer: Medicare Other | Admitting: Family Medicine

## 2019-01-03 ENCOUNTER — Other Ambulatory Visit: Payer: Self-pay

## 2019-01-03 VITALS — BP 110/64 | HR 64 | Temp 98.4°F | Resp 15 | Ht 65.0 in | Wt 165.0 lb

## 2019-01-03 DIAGNOSIS — Z0000 Encounter for general adult medical examination without abnormal findings: Secondary | ICD-10-CM | POA: Diagnosis not present

## 2019-01-03 DIAGNOSIS — E559 Vitamin D deficiency, unspecified: Secondary | ICD-10-CM

## 2019-01-03 MED ORDER — ERGOCALCIFEROL 1.25 MG (50000 UT) PO CAPS: 50000.0000 [IU] | ORAL_CAPSULE | ORAL | 1 refills | Status: DC

## 2019-01-03 NOTE — Assessment & Plan Note (Signed)
Annual exam as documented. Counseling done  re healthy lifestyle involving commitment to 150 minutes exercise per week, heart healthy diet, and attaining healthy weight.The importance of adequate sleep also discussed.  Immunization and cancer screening needs are specifically addressed at this visit.  

## 2019-01-03 NOTE — Progress Notes (Signed)
    Gina Nelson     MRN: [id number]      DOB: 1950-03-21  HPI: Patient is in for annual physical exam. No other health concerns are expressed or addressed at the visit. Recent labs, are reviewed. Immunization is reviewed , and  Is up to date PE: BP 110/64   Pulse 64   Temp 98.4 F (36.9 C) (Temporal)   Resp 15   Ht 5\' 5"  (1.651 m)   Wt 165 lb (74.8 kg)   SpO2 98%   BMI 27.46 kg/m   Pleasant  female, alert and oriented x 3, in no cardio-pulmonary distress. Afebrile. HEENT No facial trauma or asymetry. Sinuses non tender.  Extra occullar muscles intact.. External ears normal, . Neck: supple, no adenopathy,JVD or thyromegaly.No bruits.  Chest: Clear to ascultation bilaterally.No crackles or wheezes. Non tender to palpation  Breast: Double mastectomy No supraclavicular or axillary lymph nodes palpable  Cardiovascular system; Heart sounds normal,  S1 and  S2 ,no S3.  No murmur, or thrill. Apical beat not displaced Peripheral pulses normal.  Abdomen: Soft, non tender, no organomegaly or masses. No bruits. Bowel sounds normal. No guarding, tenderness or rebound.      Musculoskeletal exam: Full ROM of spine, hips , shoulders and  Mildly reduced in left knee. No deformity ,swelling or crepitus noted. No muscle wasting or atrophy.   Neurologic: Cranial nerves 2 to 12 intact. Power, tone ,sensation and reflexes normal throughout. No disturbance in gait. No tremor.  Skin: Intact, no ulceration, erythema , scaling or rash noted. Pigmentation normal throughout  Psych; Normal mood and affect. Judgement and concentration normal   Assessment & Plan:  Annual physical exam Annual exam as documented. Counseling done  re healthy lifestyle involving commitment to 150 minutes exercise per week, heart healthy diet, and attaining healthy weight.The importance of adequate sleep also discussed.  Immunization and cancer screening needs are specifically addressed at  this visit.   Vitamin D deficiency Start once weekly vit D x 6 months and re eval

## 2019-01-03 NOTE — Patient Instructions (Signed)
F/u in office with MD in 6 months, call if you need me before  Excellent exam and labs  Your vit D is low, I have prescribed once weekly vitamin d fo the next 6 months  Vitamin d level to be rechecked  1 week before follow up in June ( please give lab order0  It is important that you exercise regularly at least 30 minutes 5 times a week. If you develop chest pain, have severe difficulty breathing, or feel very tired, stop exercising immediately and seek medical attention    Think about what you will eat, plan ahead. Choose " clean, green, fresh or frozen" over canned, processed or packaged foods which are more sugary, salty and fatty. 70 to 75% of food eaten should be vegetables and fruit. Three meals at set times with snacks allowed between meals, but they must be fruit or vegetables. Aim to eat over a 12 hour period , example 7 am to 7 pm, and STOP after  your last meal of the day. Drink water,generally about 64 ounces per day, no other drink is as healthy. Fruit juice is best enjoyed in a healthy way, by EATING the fruit.   Thanks for choosing [facility], we consider it a privelige to serve you.

## 2019-01-03 NOTE — Assessment & Plan Note (Signed)
Start once weekly vit D x 6 months and re eval

## 2019-01-04 ENCOUNTER — Encounter: Payer: Self-pay | Admitting: Family Medicine

## 2019-01-12 ENCOUNTER — Ambulatory Visit (INDEPENDENT_AMBULATORY_CARE_PROVIDER_SITE_OTHER): Payer: Medicare Other

## 2019-01-12 ENCOUNTER — Other Ambulatory Visit: Payer: Self-pay

## 2019-01-12 ENCOUNTER — Encounter (INDEPENDENT_AMBULATORY_CARE_PROVIDER_SITE_OTHER): Payer: Self-pay

## 2019-01-12 DIAGNOSIS — R3989 Other symptoms and signs involving the genitourinary system: Secondary | ICD-10-CM

## 2019-01-12 LAB — POCT URINALYSIS DIP (CLINITEK)
Bilirubin, UA: NEGATIVE
Blood, UA: NEGATIVE
Glucose, UA: NEGATIVE mg/dL
Ketones, POC UA: NEGATIVE mg/dL
Leukocytes, UA: NEGATIVE
Nitrite, UA: NEGATIVE
POC PROTEIN,UA: NEGATIVE
Spec Grav, UA: 1.025 (ref 1.010–1.025)
Urobilinogen, UA: 0.2 E.U./dL
pH, UA: 5.5 (ref 5.0–8.0)

## 2019-01-19 ENCOUNTER — Encounter (INDEPENDENT_AMBULATORY_CARE_PROVIDER_SITE_OTHER): Payer: Self-pay

## 2019-01-19 ENCOUNTER — Other Ambulatory Visit: Payer: Self-pay

## 2019-01-19 ENCOUNTER — Ambulatory Visit (INDEPENDENT_AMBULATORY_CARE_PROVIDER_SITE_OTHER): Payer: Medicare PPO | Admitting: Family Medicine

## 2019-01-19 VITALS — BP 104/68 | HR 60 | Temp 98.4°F | Ht 65.0 in | Wt 165.0 lb

## 2019-01-19 DIAGNOSIS — N3091 Cystitis, unspecified with hematuria: Secondary | ICD-10-CM | POA: Insufficient documentation

## 2019-01-19 DIAGNOSIS — N3001 Acute cystitis with hematuria: Secondary | ICD-10-CM | POA: Diagnosis not present

## 2019-01-19 LAB — POCT URINALYSIS DIP (CLINITEK)
Bilirubin, UA: NEGATIVE
Glucose, UA: NEGATIVE mg/dL
Ketones, POC UA: NEGATIVE mg/dL
Nitrite, UA: NEGATIVE
POC PROTEIN,UA: NEGATIVE
Spec Grav, UA: 1.02 (ref 1.010–1.025)
Urobilinogen, UA: 0.2 E.U./dL
pH, UA: 5.5 (ref 5.0–8.0)

## 2019-01-19 MED ORDER — SULFAMETHOXAZOLE-TRIMETHOPRIM 800-160 MG PO TABS: 1.0000 | ORAL_TABLET | Freq: Two times a day (BID) | ORAL | 0 refills | Status: DC

## 2019-01-19 NOTE — Patient Instructions (Signed)
F/U with MD as before, call if you need me sooner  You are treated for acute urinary tract infection, 3 day course of septra prescribed for this and the urine is sent for culture   Thanks for choosing [facility], we consider it a privelige to serve you.

## 2019-01-20 ENCOUNTER — Other Ambulatory Visit (HOSPITAL_COMMUNITY)
Admission: RE | Admit: 2019-01-20 | Discharge: 2019-01-20 | Disposition: A | Discharge status: Home / Self Care | Payer: Medicare PPO | Source: Other Acute Inpatient Hospital | Attending: Family Medicine | Admitting: Family Medicine

## 2019-01-20 DIAGNOSIS — N3001 Acute cystitis with hematuria: Secondary | ICD-10-CM | POA: Diagnosis not present

## 2019-01-21 ENCOUNTER — Encounter: Payer: Self-pay | Admitting: Family Medicine

## 2019-01-21 LAB — URINE CULTURE: Culture: NO GROWTH

## 2019-01-21 NOTE — Progress Notes (Signed)
   ARLEDA LAHIFF     MRN: [id number]      DOB: 1950/05/23   HPI Ms. Niemann is here with a 1 week h/o post void pressure, symptoms of dysuria and frequency worsened today, so in for testing. Denies flank pain, fever, chills or nausea, has increased water intake as well as cranberry juice. No other complaints ROS Denies recent fever or chills. Denies sinus pressure, nasal congestion, ear pain or sore throat. Denies chest congestion, productive cough or wheezing. Denies chest pains, palpitations and leg swelling Denies abdominal pain, nausea, vomiting,diarrhea or constipation.    Denies joint pain, swelling and limitation in mobility. . Denies skin break down or rash.   PE  BP 104/68   Pulse 60   Temp 98.4 F (36.9 C) (Temporal)   Ht 5\' 5"  (1.651 m)   Wt 165 lb (74.8 kg)   SpO2 98%   BMI 27.46 kg/m   Patient alert and oriented and in no cardiopulmonary distress.  HEENT: No facial asymmetry, EOMI,     Neck supple .    ABD: Soft non tender.No renal angle tenderness   Ext: No edema  MS: Adequate ROM spine, shoulders, hips and knees.  Psych: Good eye contact, normal affect. Memory intact not anxious or depressed appearing.  CNS: CN 2-12 intact, power,  normal throughout.no focal deficits noted.   Assessment & Plan  Cystitis with hematuria Symptomatic x 1 weeek, worse x 1 day, abn CCUA, treat presumptively and f/u c/s

## 2019-01-21 NOTE — Assessment & Plan Note (Signed)
Symptomatic x 1 weeek, worse x 1 day, abn CCUA, treat presumptively and f/u c/s

## 2019-01-25 ENCOUNTER — Other Ambulatory Visit: Payer: Medicare PPO

## 2019-01-26 ENCOUNTER — Other Ambulatory Visit: Payer: Medicare PPO

## 2019-01-26 ENCOUNTER — Other Ambulatory Visit: Payer: Self-pay

## 2019-01-26 ENCOUNTER — Ambulatory Visit: Payer: Medicare PPO | Attending: Internal Medicine

## 2019-01-26 DIAGNOSIS — Z20822 Contact with and (suspected) exposure to covid-19: Secondary | ICD-10-CM

## 2019-01-27 LAB — NOVEL CORONAVIRUS, NAA: SARS-CoV-2, NAA: NOT DETECTED

## 2019-02-01 ENCOUNTER — Telehealth: Payer: Self-pay | Admitting: General Practice

## 2019-02-01 NOTE — Telephone Encounter (Signed)
Negative COVID results given. Patient results "NOT Detected." Caller expressed understanding. ° °

## 2019-05-06 ENCOUNTER — Ambulatory Visit: Payer: Medicare PPO

## 2019-05-06 ENCOUNTER — Other Ambulatory Visit: Payer: Self-pay

## 2019-05-06 ENCOUNTER — Encounter: Payer: Self-pay | Admitting: Orthopedic Surgery

## 2019-05-06 ENCOUNTER — Ambulatory Visit (INDEPENDENT_AMBULATORY_CARE_PROVIDER_SITE_OTHER): Payer: Medicare PPO | Admitting: Orthopedic Surgery

## 2019-05-06 VITALS — BP 129/76 | HR 76 | Ht 65.0 in | Wt 168.0 lb

## 2019-05-06 DIAGNOSIS — M79671 Pain in right foot: Secondary | ICD-10-CM | POA: Diagnosis not present

## 2019-05-06 NOTE — Progress Notes (Signed)
Chief Complaint  Patient presents with  . Foot Pain    right foot injury hit on dresser while changing sheets on bed    69 year old female comes in with pain second digit right foot complains of pain in the second digit and MTP joints of 2 3 and 4 x 2 weeks after hitting the foot against a dresser  Review of systems she reports negative  Past Medical History:  Diagnosis Date  . Allergy   . Arthritis    knee  . Blood transfusion   . Cancer [facility])    right breast T1cN2  . PONV (postoperative nausea and vomiting)     BP 129/76   Pulse 76   Ht 5\' 5"  (1.651 m)   Wt 168 lb (76.2 kg)   BMI 27.96 kg/m   Right foot second digit is swollen and tender to touch there is no malalignment neurovascular exam is intact skin is intact  X-ray shows a fracture of the proximal phalanx at the distal aspect is nondisplaced best seen on the AP view  Recommend continue with the comfortable croc shoe x-ray in 4 weeks (office visits no fracture care)

## 2019-06-08 ENCOUNTER — Ambulatory Visit: Payer: Medicare PPO | Admitting: Orthopedic Surgery

## 2019-06-08 ENCOUNTER — Telehealth: Payer: Self-pay

## 2019-06-08 ENCOUNTER — Other Ambulatory Visit: Payer: Self-pay | Admitting: Family Medicine

## 2019-06-08 MED ORDER — MECLIZINE HCL 12.5 MG PO TABS: 12.5000 mg | ORAL_TABLET | Freq: Three times a day (TID) | ORAL | 0 refills | Status: DC | PRN

## 2019-06-08 NOTE — Telephone Encounter (Signed)
Please call and inquire and document onset and duration of symptoms, then after this send in historical script already entered, or if needed, re send script for antivert 12.5 mg three times daily as needed, #21 refill zero

## 2019-06-08 NOTE — Telephone Encounter (Signed)
Please send medication to [facility] for Vertigo

## 2019-06-09 NOTE — Telephone Encounter (Signed)
Last week it started,  feels like the room the was spinning. Same symptoms as before. Already got med and it worked just fine and symptoms resolved

## 2019-06-15 DIAGNOSIS — E559 Vitamin D deficiency, unspecified: Secondary | ICD-10-CM | POA: Diagnosis not present

## 2019-06-15 LAB — VITAMIN D 25 HYDROXY (VIT D DEFICIENCY, FRACTURES): Vit D, 25-Hydroxy: 33 ng/mL (ref 30–100)

## 2019-06-16 ENCOUNTER — Encounter: Payer: Self-pay | Admitting: Family Medicine

## 2019-06-27 ENCOUNTER — Telehealth (INDEPENDENT_AMBULATORY_CARE_PROVIDER_SITE_OTHER): Payer: Medicare PPO | Admitting: Family Medicine

## 2019-06-27 ENCOUNTER — Other Ambulatory Visit: Payer: Self-pay

## 2019-06-27 ENCOUNTER — Encounter: Payer: Self-pay | Admitting: Family Medicine

## 2019-06-27 VITALS — BP 129/76 | Ht 65.0 in | Wt 168.0 lb

## 2019-06-27 DIAGNOSIS — N3001 Acute cystitis with hematuria: Secondary | ICD-10-CM | POA: Diagnosis not present

## 2019-06-27 DIAGNOSIS — N3091 Cystitis, unspecified with hematuria: Secondary | ICD-10-CM | POA: Diagnosis not present

## 2019-06-27 LAB — POCT URINALYSIS DIP (CLINITEK)
Bilirubin, UA: NEGATIVE
Glucose, UA: NEGATIVE mg/dL
Nitrite, UA: NEGATIVE
Spec Grav, UA: >=1.03 — AB (ref 1.010–1.025)
Urobilinogen, UA: 0.2 E.U./dL
pH, UA: 5.5 (ref 5.0–8.0)

## 2019-06-27 MED ORDER — CEPHALEXIN 500 MG PO CAPS: 500.0000 mg | ORAL_CAPSULE | Freq: Two times a day (BID) | ORAL | 0 refills | Status: AC

## 2019-06-27 NOTE — Assessment & Plan Note (Signed)
Symptomatic with abnormal CCUA. Start keflex x 5 days and f/u C/S Advised to ensure she drinks 64 ounces of water daily minimum and voids frequently.

## 2019-06-27 NOTE — Patient Instructions (Addendum)
F/U as before , call if you  Need me sooner  Five day course of keflex is prescribed for urinary tract infection, we will call if medication prescribed doen not treat this  Ensure you drink 64 ounces water daily and empty often  Thanks for choosing [facility], we consider it a privelige to serve you.   Urinary Tract Infection, Adult A urinary tract infection (UTI) is an infection of any part of the urinary tract. The urinary tract includes:  The kidneys.  The ureters.  The bladder.  The urethra. These organs make, store, and get rid of pee (urine) in the body. What are the causes? This is caused by germs (bacteria) in your genital area. These germs grow and cause swelling (inflammation) of your urinary tract. What increases the risk? You are more likely to develop this condition if:  You have a small, thin tube (catheter) to drain pee.  You cannot control when you pee or poop (incontinence).  You are female, and: ? You use these methods to prevent pregnancy:  A medicine that kills sperm (spermicide).  A device that blocks sperm (diaphragm). ? You have low levels of a female hormone (estrogen). ? You are pregnant.  You have genes that add to your risk.  You are sexually active.  You take antibiotic medicines.  You have trouble peeing because of: ? A prostate that is bigger than normal, if you are female. ? A blockage in the part of your body that drains pee from the bladder (urethra). ? A kidney stone. ? A nerve condition that affects your bladder (neurogenic bladder). ? Not getting enough to drink. ? Not peeing often enough.  You have other conditions, such as: ? Diabetes. ? A weak disease-fighting system (immune system). ? Sickle cell disease. ? Gout. ? Injury of the spine. What are the signs or symptoms? Symptoms of this condition include:  Needing to pee right away (urgently).  Peeing often.  Peeing small amounts often.  Pain or burning  when peeing.  Blood in the pee.  Pee that smells bad or not like normal.  Trouble peeing.  Pee that is cloudy.  Fluid coming from the vagina, if you are female.  Pain in the belly or lower back. Other symptoms include:  Throwing up (vomiting).  No urge to eat.  Feeling mixed up (confused).  Being tired and grouchy (irritable).  A fever.  Watery poop (diarrhea). How is this treated? This condition may be treated with:  Antibiotic medicine.  Other medicines.  Drinking enough water. Follow these instructions at home:  Medicines  Take over-the-counter and prescription medicines only as told by your doctor.  If you were prescribed an antibiotic medicine, take it as told by your doctor. Do not stop taking it even if you start to feel better. General instructions  Make sure you: ? Pee until your bladder is empty. ? Do not hold pee for a long time. ? Empty your bladder after sex. ? Wipe from front to back after pooping if you are a female. Use each tissue one time when you wipe.  Drink enough fluid to keep your pee pale yellow.  Keep all follow-up visits as told by your doctor. This is important. Contact a doctor if:  You do not get better after 1-2 days.  Your symptoms go away and then come back. Get help right away if:  You have very bad back pain.  You have very bad pain in your lower belly.  You have a fever.  You are sick to your stomach (nauseous).  You are throwing up. Summary  A urinary tract infection (UTI) is an infection of any part of the urinary tract.  This condition is caused by germs in your genital area.  There are many risk factors for a UTI. These include having a small, thin tube to drain pee and not being able to control when you pee or poop.  Treatment includes antibiotic medicines for germs.  Drink enough fluid to keep your pee pale yellow. This information is not intended to replace advice given to you by your health care  provider. Make sure you discuss any questions you have with your health care provider. Document Revised: 12/17/2017 Document Reviewed: 07/09/2017 Elsevier Patient Education  2020 [employer].

## 2019-06-27 NOTE — Progress Notes (Signed)
Virtual Visit via Telephone Note  I connected with Gina Nelson on 06/27/19 at  3:00 PM EDT by telephone and verified that I am speaking with the correct person using two identifiers.  Location: Patient: home Provider: office   I discussed the limitations, risks, security and privacy concerns of performing an evaluation and management service by telephone and the availability of in person appointments. I also discussed with the patient that there may be a patient responsible charge related to this service. The patient expressed understanding and agreed to proceed.   History of Present Illness: 3 day h/o dysuria and frequency, urine looks " dark" Denies fever, chills or flank pain Has had similar symptoms in the past Otherwise no concerns   Observations/Objective: BP 129/76   Ht 5\' 5"  (1.651 m)   Wt 168 lb (76.2 kg)   BMI 27.96 kg/m  Good communication with no confusion and intact memory. Alert and oriented x 3 No signs of respiratory distress during speech    Assessment and Plan: Cystitis with hematuria Symptomatic with abnormal CCUA. Start keflex x 5 days and f/u C/S Advised to ensure she drinks 64 ounces of water daily minimum and voids frequently.    Follow Up Instructions:    I discussed the assessment and treatment plan with the patient. The patient was provided an opportunity to ask questions and all were answered. The patient agreed with the plan and demonstrated an understanding of the instructions.   The patient was advised to call back or seek an in-person evaluation if the symptoms worsen or if the condition fails to improve as anticipated.  I provided 10 minutes of non-face-to-face time during this encounter.   Tula Nakayama, MD

## 2019-06-28 ENCOUNTER — Other Ambulatory Visit (HOSPITAL_COMMUNITY)
Admission: RE | Admit: 2019-06-28 | Discharge: 2019-06-28 | Disposition: A | Discharge status: Home / Self Care | Payer: Medicare PPO | Source: Other Acute Inpatient Hospital | Attending: Family Medicine | Admitting: Family Medicine

## 2019-06-28 DIAGNOSIS — N3001 Acute cystitis with hematuria: Secondary | ICD-10-CM | POA: Diagnosis not present

## 2019-06-30 ENCOUNTER — Other Ambulatory Visit: Payer: Self-pay | Admitting: Family Medicine

## 2019-06-30 LAB — URINE CULTURE: Culture: >=100000 — AB

## 2019-06-30 NOTE — Progress Notes (Signed)
Nitrofurantoin

## 2019-07-01 ENCOUNTER — Other Ambulatory Visit: Payer: Self-pay

## 2019-07-01 MED ORDER — NITROFURANTOIN MONOHYD MACRO 100 MG PO CAPS: 100.0000 mg | ORAL_CAPSULE | Freq: Two times a day (BID) | ORAL | 0 refills | Status: DC

## 2019-07-04 ENCOUNTER — Ambulatory Visit (INDEPENDENT_AMBULATORY_CARE_PROVIDER_SITE_OTHER): Payer: Medicare PPO | Admitting: Family Medicine

## 2019-07-04 ENCOUNTER — Encounter: Payer: Self-pay | Admitting: Family Medicine

## 2019-07-04 ENCOUNTER — Other Ambulatory Visit: Payer: Self-pay

## 2019-07-04 VITALS — BP 118/78 | HR 64 | Temp 98.4°F | Resp 16 | Ht 65.0 in | Wt 169.8 lb

## 2019-07-04 DIAGNOSIS — J302 Other seasonal allergic rhinitis: Secondary | ICD-10-CM

## 2019-07-04 DIAGNOSIS — C50919 Malignant neoplasm of unspecified site of unspecified female breast: Secondary | ICD-10-CM | POA: Diagnosis not present

## 2019-07-04 DIAGNOSIS — N3091 Cystitis, unspecified with hematuria: Secondary | ICD-10-CM

## 2019-07-04 DIAGNOSIS — E663 Overweight: Secondary | ICD-10-CM | POA: Diagnosis not present

## 2019-07-04 DIAGNOSIS — Z780 Asymptomatic menopausal state: Secondary | ICD-10-CM | POA: Diagnosis not present

## 2019-07-04 DIAGNOSIS — E559 Vitamin D deficiency, unspecified: Secondary | ICD-10-CM | POA: Diagnosis not present

## 2019-07-04 MED ORDER — ERGOCALCIFEROL 1.25 MG (50000 UT) PO CAPS: 50000.0000 [IU] | ORAL_CAPSULE | ORAL | 1 refills | Status: DC

## 2019-07-04 NOTE — Patient Instructions (Addendum)
Please schedule wellness when due  Please schedule bone density test  Annual physical exam with MD in December when due  Take once weekly vit D for next 6 months, then OTC once daily vit D 3, 2000 IU once daily  Calcium 600 mg twice daily, tablet or food equivalent  PLEASE be careful, practice safety and no more falls, dont want fractures  Thanks for choosing [facility], we consider it a privelige to serve you.

## 2019-07-06 ENCOUNTER — Encounter: Payer: Self-pay | Admitting: Family Medicine

## 2019-07-06 NOTE — Assessment & Plan Note (Signed)
Still taking tamoxifen and followed by Oncology

## 2019-07-06 NOTE — Assessment & Plan Note (Signed)
Needs to commit to once weekly vit D for 6 to 9 months

## 2019-07-06 NOTE — Progress Notes (Signed)
   DEDRIA Gina Nelson     MRN: [id number]      DOB: 1950/02/27   HPI Gina Nelson is here for follow up and re-evaluation of chronic medical conditions, medication management and review of any available recent lab and radiology data.  Preventive health is updated, specifically  Cancer screening and Immunization.   Much improved since last week when she was treated for UTI. Currently asymptomatic, completing antibiotic course, denies fever, chills or flank pain  ROS Denies recent fever or chills. Denies sinus pressure, nasal congestion, ear pain or sore throat.Intermittent sinus allergy symptoms Denies chest congestion, productive cough or wheezing. Denies chest pains, palpitations and leg swelling Denies abdominal pain, nausea, vomiting,diarrhea or constipation.   Denies dysuria, frequency, hesitancy or incontinence. Denies uncontrolled  joint pain, swelling and limitation in mobility. Denies headaches, seizures, numbness, or tingling. Denies depression, anxiety or insomnia. Denies skin break down or rash.   PE  BP 118/78 (BP Location: Left Arm, Patient Position: Sitting, Cuff Size: Normal)   Pulse 64   Temp 98.4 F (36.9 C) (Oral)   Resp 16   Ht 5\' 5"  (1.651 m)   Wt 169 lb 12.8 oz (77 kg)   SpO2 94%   BMI 28.26 kg/m   Patient alert and oriented and in no cardiopulmonary distress.  HEENT: No facial asymmetry, EOMI,     Neck supple .  Chest: Clear to auscultation bilaterally.  CVS: S1, S2 no murmurs, no S3.Regular rate.  ABD: Soft non tender.   Ext: No edema  MS: Adequate ROM spine, shoulders, hips and knees.  Skin: Intact, no ulcerations or rash noted.  Psych: Good eye contact, normal affect. Memory intact not anxious or depressed appearing.  CNS: CN 2-12 intact, power,  normal throughout.no focal deficits noted.   Assessment & Plan  Cystitis with hematuria Near completing antibiotic course of treatment, 100 % better and currently asymptomatic.  Encouraged to  void regularly and drink at least 64 oz water  Seasonal allergies Uses OTC medication as needed, with good effect  Breast cancer-right T2N1. Still taking tamoxifen and followed by Oncology  Vitamin D deficiency Needs to commit to once weekly vit D for 6 to 9 months  Overweight (BMI 25.0-29.9)  Patient re-educated about  the importance of commitment to a  minimum of 150 minutes of exercise per week as able.  The importance of healthy food choices with portion control discussed, as well as eating regularly and within a 12 hour window most days. The need to choose "clean , green" food 50 to 75% of the time is discussed, as well as to make water the primary drink and set a goal of 64 ounces water daily.    Weight /BMI 07/04/2019 06/27/2019 05/06/2019  WEIGHT 169 lb 12.8 oz 168 lb 168 lb  HEIGHT 5\' 5"  5\' 5"  5\' 5"   BMI 28.26 kg/m2 27.96 kg/m2 27.96 kg/m2

## 2019-07-06 NOTE — Assessment & Plan Note (Signed)
  Patient re-educated about  the importance of commitment to a  minimum of 150 minutes of exercise per week as able.  The importance of healthy food choices with portion control discussed, as well as eating regularly and within a 12 hour window most days. The need to choose "clean , green" food 50 to 75% of the time is discussed, as well as to make water the primary drink and set a goal of 64 ounces water daily.    Weight /BMI 07/04/2019 06/27/2019 05/06/2019  WEIGHT 169 lb 12.8 oz 168 lb 168 lb  HEIGHT 5\' 5"  5\' 5"  5\' 5"   BMI 28.26 kg/m2 27.96 kg/m2 27.96 kg/m2

## 2019-07-06 NOTE — Assessment & Plan Note (Signed)
Uses OTC medication as needed, with good effect

## 2019-07-06 NOTE — Assessment & Plan Note (Signed)
Near completing antibiotic course of treatment, 100 % better and currently asymptomatic.  Encouraged to void regularly and drink at least 64 oz water

## 2019-07-14 ENCOUNTER — Other Ambulatory Visit (HOSPITAL_COMMUNITY): Payer: Medicare PPO

## 2019-07-15 ENCOUNTER — Other Ambulatory Visit: Payer: Self-pay

## 2019-07-15 ENCOUNTER — Ambulatory Visit (HOSPITAL_COMMUNITY)
Admission: RE | Admit: 2019-07-15 | Discharge: 2019-07-15 | Disposition: A | Discharge status: Home / Self Care | Payer: Medicare PPO | Source: Ambulatory Visit | Attending: Family Medicine | Admitting: Family Medicine

## 2019-07-15 DIAGNOSIS — Z780 Asymptomatic menopausal state: Secondary | ICD-10-CM | POA: Diagnosis not present

## 2019-07-15 DIAGNOSIS — Z13820 Encounter for screening for osteoporosis: Secondary | ICD-10-CM | POA: Diagnosis not present

## 2019-07-21 ENCOUNTER — Encounter: Payer: Medicare Other | Admitting: Family Medicine

## 2019-08-01 ENCOUNTER — Telehealth (INDEPENDENT_AMBULATORY_CARE_PROVIDER_SITE_OTHER): Payer: Medicare PPO

## 2019-08-01 ENCOUNTER — Other Ambulatory Visit: Payer: Self-pay

## 2019-08-01 VITALS — BP 118/78 | Ht 65.0 in | Wt 169.0 lb

## 2019-08-01 DIAGNOSIS — Z0000 Encounter for general adult medical examination without abnormal findings: Secondary | ICD-10-CM

## 2019-08-01 NOTE — Progress Notes (Signed)
Subjective:   Gina Nelson is a 69 y.o. female who presents for Medicare Annual (Subsequent) preventive examination.  Review of Systems     Cardiac Risk Factors include: advanced age (>26men, >52 women)     Objective:    Today's Vitals   08/01/19 0806  BP: 118/78  Weight: 169 lb (76.7 kg)  Height: 5\' 5"  (1.651 m)  PainSc: 0-No pain   Body mass index is 28.12 kg/m.  Advanced Directives 08/01/2019 05/08/2017 12/12/2016 04/24/2016 12/01/2015 10/10/2015 06/25/2015  Does Patient Have a Medical Advance Directive? No No No No No Yes No  Does patient want to make changes to medical advance directive? - No - Patient declined - - - - -  Would patient like information on creating a medical advance directive? Yes (ED - Information included in AVS) No - Patient declined No - Patient declined No - Patient declined No - patient declined information - No - patient declined information  Pre-existing out of facility DNR order (yellow form or pink MOST form) - - - - - - -    Current Medications (verified) Outpatient Encounter Medications as of 08/01/2019  Medication Sig  . acetaminophen (TYLENOL) 500 MG tablet Take 1,000 mg by mouth 2 (two) times daily as needed for headache.   . ergocalciferol (VITAMIN D2) 1.25 MG (50000 UT) capsule Take 1 capsule (50,000 Units total) by mouth once a week. One capsule once weekly  . loratadine (CLARITIN) 10 MG tablet Take 1 tablet (10 mg total) by mouth daily.  . meclizine (ANTIVERT) 12.5 MG tablet Take 1 tablet (12.5 mg total) by mouth 3 (three) times daily as needed for dizziness.  . tamoxifen (NOLVADEX) 20 MG tablet Take 1 tablet (20 mg total) by mouth at bedtime.  . [DISCONTINUED] nitrofurantoin, macrocrystal-monohydrate, (MACROBID) 100 MG capsule Take 1 capsule (100 mg total) by mouth 2 (two) times daily.   No facility-administered encounter medications on file as of 08/01/2019.    Allergies (verified) Codeine   History: Past Medical History:   Diagnosis Date  . Allergy   . Arthritis    knee  . Blood transfusion   . Cancer [facility])    right breast T1cN2  . PONV (postoperative nausea and vomiting)    Past Surgical History:  Procedure Laterality Date  . ABDOMINAL HYSTERECTOMY  1984  . BIOPSY  12/12/2016   Procedure: BIOPSY;  Surgeon: Rogene Houston, MD;  Location: [facility];  Service: Endoscopy;;  gastric  . BREAST SURGERY  right MRM, left simple mastectomy   2012  . BREAST SURGERY  left mastectomy   2012  . COLONOSCOPY  05/07/2011   Procedure: COLONOSCOPY;  Surgeon: Rogene Houston, MD;  Location: [facility];  Service: Endoscopy;  Laterality: N/A;  830  . ESOPHAGOGASTRODUODENOSCOPY N/A 12/12/2016   Procedure: ESOPHAGOGASTRODUODENOSCOPY (EGD);  Surgeon: Rogene Houston, MD;  Location: [facility];  Service: Endoscopy;  Laterality: N/A;  9:15  . JOINT REPLACEMENT  11/2009   right knee  . PORT-A-CATH REMOVAL  07/16/10  . TOTAL KNEE ARTHROPLASTY  2011   Family History  Problem Relation Age of Onset  . Prostate cancer Father   . Pancreatic cancer Sister   . Pneumonia Mother   . Hypertension Daughter    Social History   Socioeconomic History  . Marital status: Married    Spouse name: Izell Wallace  . Number of children: 1  . Years of education: Not on file  . Highest education level: 12th grade  Occupational History  . Occupation: retired  Tobacco Use  . Smoking status: Never Smoker  . Smokeless tobacco: Never Used  Vaping Use  . Vaping Use: Never used  Substance and Sexual Activity  . Alcohol use: No  . Drug use: No  . Sexual activity: Yes    Birth control/protection: Surgical  Other Topics Concern  . Not on file  Social History Narrative   Has been married 49 years. Husband is a Company secretary. She is retired.Visits sick people in the hospital. Enjoys word search puzzles.   Social Determinants of Health   Financial Resource Strain: Low Risk   . Difficulty of Paying Living Expenses: Not hard at all  Food  Insecurity: No Food Insecurity  . Worried About Charity fundraiser in the Last Year: Never true  . Ran Out of Food in the Last Year: Never true  Transportation Needs: No Transportation Needs  . Lack of Transportation (Medical): No  . Lack of Transportation (Non-Medical): No  Physical Activity: Sufficiently Active  . Days of Exercise per Week: 4 days  . Minutes of Exercise per Session: 60 min  Stress: No Stress Concern Present  . Feeling of Stress : Not at all  Social Connections: Moderately Integrated  . Frequency of Communication with Friends and Family: More than three times a week  . Frequency of Social Gatherings with Friends and Family: More than three times a week  . Attends Religious Services: More than 4 times per year  . Active Member of Clubs or Organizations: No  . Attends Archivist Meetings: Never  . Marital Status: Married    Tobacco Counseling Counseling given: Not Answered   Clinical Intake:  Pre-visit preparation completed: Yes  Pain : No/denies pain Pain Score: 0-No pain     Nutritional Status: BMI 25 -29 Overweight Diabetes: No  How often do you need to have someone help you when you read instructions, pamphlets, or other written materials from your doctor or pharmacy?: 1 - Never What is the last grade level you completed in school?: 12th grade  Diabetic? No, prediabtetic     Information entered by :: Charlise Giovanetti LPN   Activities of Daily Living In your present state of health, do you have any difficulty performing the following activities: 08/01/2019 06/27/2019  Hearing? N N  Vision? N N  Difficulty concentrating or making decisions? N N  Walking or climbing stairs? Y N  Dressing or bathing? N N  Doing errands, shopping? N N  Preparing Food and eating ? N -  Using the Toilet? N -  In the past six months, have you accidently leaked urine? N -  Do you have problems with loss of bowel control? N -  Managing your Medications? N -   Managing your Finances? N -  Housekeeping or managing your Housekeeping? N -  Some recent data might be hidden    Patient Care Team: [name], MD as PCP - General [name], MD as Consulting Physician (General Surgery) [name], MD as Consulting Physician (Obstetrics and Gynecology) Magrinat, Virgie Dad, MD as Consulting Physician (Oncology) Madelin Headings, DO as Consulting Physician (Optometry) [name], Mechele Dawley, MD as Consulting Physician (Gastroenterology)  Indicate any recent Medical Services you may have received from other than [facility] providers in the past year (date may be approximate).     Assessment:   This is a routine wellness examination for Chasya.  Hearing/Vision screen No exam data present  Dietary issues and  exercise activities discussed: Current Exercise Habits: Home exercise routine, Type of exercise: walking, Time (Minutes): 60, Frequency (Times/Week): 4, Weekly Exercise (Minutes/Week): 240, Intensity: Mild, Exercise limited by: orthopedic condition(s)  Goals    . DIET - EAT MORE FRUITS AND VEGETABLES    . Exercise 3x per week (30 min per time)     Recommend increasing your routine exercise program at least 5 days a week for 15 minutes at a time as tolerated.      Marland Kitchen LIFESTYLE - DECREASE FALLS RISK      Depression Screen PHQ 2/9 Scores 08/01/2019 07/04/2019 06/27/2019 01/03/2019 07/20/2018 07/15/2018 12/30/2017  PHQ - 2 Score 0 0 0 0 2 0 0  PHQ- 9 Score - - - - 2 - -    Fall Risk Fall Risk  08/01/2019 07/04/2019 07/04/2019 06/27/2019 01/19/2019  Falls in the past year? 0 1 1 0 0  Number falls in past yr: 0 0 0 0 0  Injury with Fall? 0 0 0 0 0  Risk Factor Category  - - - - -  Risk for fall due to : - - No Fall Risks No Fall Risks -  Follow up - - Falls evaluation completed;Education provided;Falls prevention discussed Falls evaluation completed -  Comment - - - - -    Any stairs in or around the home? No  If so, are there any without  handrails? No  Home free of loose throw rugs in walkways, pet beds, electrical cords, etc? Yes  Adequate lighting in your home to reduce risk of falls? Yes   ASSISTIVE DEVICES UTILIZED TO PREVENT FALLS:  Life alert? No  Use of a cane, walker or w/c? No  Grab bars in the bathroom? No  Shower chair or bench in shower? Yes  Elevated toilet seat or a handicapped toilet? No   TIMED UP AND GO:  Was the test performed? No .  Length of time to ambulate 10 feet:      Cognitive Function:     6CIT Screen 08/01/2019 07/20/2018 05/08/2017 04/24/2016  What Year? 0 points 0 points 0 points 0 points  What month? 0 points 0 points 0 points 0 points  What time? 0 points 0 points 0 points 0 points  Count back from 20 0 points 0 points 0 points 0 points  Months in reverse 0 points 0 points 0 points 0 points  Repeat phrase 0 points 0 points 0 points 0 points  Total Score 0 0 0 0    Immunizations Immunization History  Administered Date(s) Administered  . Fluad Quad(high Dose 65+) 09/14/2018  . Influenza Whole 11/08/2006  . Influenza,inj,Quad PF,6+ Mos 09/22/2012, 09/20/2013, 09/12/2014, 11/05/2015, 10/07/2016, 10/01/2017  . PFIZER SARS-COV-2 Vaccination 02/19/2019, 03/12/2019  . Pneumococcal Conjugate-13 04/24/2015  . Pneumococcal Polysaccharide-23 04/24/2016  . Td 08/06/2005  . Zoster 02/23/2012    TDAP status: Due, Education has been provided regarding the importance of this vaccine. Advised may receive this vaccine at local pharmacy or Health Dept. Aware to provide a copy of the vaccination record if obtained from local pharmacy or Health Dept. Verbalized acceptance and understanding. Flu Vaccine status: Up to date Pneumococcal vaccine status: Up to date Covid-19 vaccine status: Completed vaccines  Qualifies for Shingles Vaccine? Yes   Zostavax completed Yes   Shingrix Completed?: No.    Education has been provided regarding the importance of this vaccine. Patient has been advised to call  insurance company to determine out of pocket expense if they have not  yet received this vaccine. Advised may also receive vaccine at local pharmacy or Health Dept. Verbalized acceptance and understanding.  Screening Tests Health Maintenance  Topic Date Due  . TETANUS/TDAP  01/09/2020 (Originally 08/07/2015)  . MAMMOGRAM  05/30/2021 (Originally 07/24/2012)  . INFLUENZA VACCINE  08/14/2019  . COLONOSCOPY  05/06/2021  . DEXA SCAN  Completed  . COVID-19 Vaccine  Completed  . Hepatitis C Screening  Completed  . PNA vac Low Risk Adult  Completed    Health Maintenance  There are no preventive care reminders to display for this patient.  Colorectal cancer screening: Completed 2013. Repeat every 10 years Mammogram status: No longer required.  Bone Density status: Completed 07/15/2019. Results reflect: Bone density results: NORMAL. Repeat every 2 years.  Lung Cancer Screening: (Low Dose CT Chest recommended if Age 49-80 years, 30 pack-year currently smoking OR have quit w/in 15years.) does not qualify.   Lung Cancer Screening Referral:   Additional Screening:  Hepatitis C Screening: does qualify; Completed   Vision Screening: Recommended annual ophthalmology exams for early detection of glaucoma and other disorders of the eye. Is the patient up to date with their annual eye exam?  Yes  Who is the provider or what is the name of the office in which the patient attends annual eye exams?  If pt is not established with a provider, would they like to be referred to a provider to establish care? No .   Dental Screening: Recommended annual dental exams for proper oral hygiene  Community Resource Referral / Chronic Care Management: CRR required this visit?  No   CCM required this visit?  No      Plan:     I have personally reviewed and noted the following in the patient's chart:   . Medical and social history . Use of alcohol, tobacco or illicit drugs  . Current medications and  supplements . Functional ability and status . Nutritional status . Physical activity . Advanced directives . List of other physicians . Hospitalizations, surgeries, and ER visits in previous 12 months . Vitals . Screenings to include cognitive, depression, and falls . Referrals and appointments  In addition, I have reviewed and discussed with patient certain preventive protocols, quality metrics, and best practice recommendations. A written personalized care plan for preventive services as well as general preventive health recommendations were provided to patient.     Kate Sable, LPN, LPN   1/60/1093   Nurse Notes: Visit completed over telephone. Pt at home and provider in office. Time spent with patient: 30 mins

## 2019-08-01 NOTE — Patient Instructions (Signed)
[name] , Thank you for taking time to come for your Medicare Wellness Visit. I appreciate your ongoing commitment to your health goals. Please review the following plan we discussed and let me know if I can assist you in the future.   Screening recommendations/referrals: Colonoscopy: up to date Mammogram: no longer needed  Bone Density: up to date (Normal)  Recommended yearly ophthalmology/optometry visit for glaucoma screening and checkup Recommended yearly dental visit for hygiene and checkup  Vaccinations: Influenza vaccine: up to date  Pneumococcal vaccine: up to date  Tdap vaccine: not covered Shingles vaccine: qualifies (check pharmacy to receive the shingrix vaccine if wanted)        Next appointment: wellness in 1 year   Preventive Care 61 Years and Older, Female Preventive care refers to lifestyle choices and visits with your health care provider that can promote health and wellness. What does preventive care include?  A yearly physical exam. This is also called an annual well check.  Dental exams once or twice a year.  Routine eye exams. Ask your health care provider how often you should have your eyes checked.  Personal lifestyle choices, including:  Daily care of your teeth and gums.  Regular physical activity.  Eating a healthy diet.  Avoiding tobacco and drug use.  Limiting alcohol use.  Practicing safe sex.  Taking low-dose aspirin every day.  Taking vitamin and mineral supplements as recommended by your health care provider. What happens during an annual well check? The services and screenings done by your health care provider during your annual well check will depend on your age, overall health, lifestyle risk factors, and family history of disease. Counseling  Your health care provider may ask you questions about your:  Alcohol use.  Tobacco use.  Drug use.  Emotional well-being.  Home and relationship well-being.  Sexual  activity.  Eating habits.  History of falls.  Memory and ability to understand (cognition).  Work and work Statistician.  Reproductive health. Screening  You may have the following tests or measurements:  Height, weight, and BMI.  Blood pressure.  Lipid and cholesterol levels. These may be checked every 5 years, or more frequently if you are over 30 years old.  Skin check.  Lung cancer screening. You may have this screening every year starting at age 9 if you have a 30-pack-year history of smoking and currently smoke or have quit within the past 15 years.  Fecal occult blood test (FOBT) of the stool. You may have this test every year starting at age 78.  Flexible sigmoidoscopy or colonoscopy. You may have a sigmoidoscopy every 5 years or a colonoscopy every 10 years starting at age 70.  Hepatitis C blood test.  Hepatitis B blood test.  Sexually transmitted disease (STD) testing.  Diabetes screening. This is done by checking your blood sugar (glucose) after you have not eaten for a while (fasting). You may have this done every 1-3 years.  Bone density scan. This is done to screen for osteoporosis. You may have this done starting at age 52.  Mammogram. This may be done every 1-2 years. Talk to your health care provider about how often you should have regular mammograms. Talk with your health care provider about your test results, treatment options, and if necessary, the need for more tests. Vaccines  Your health care provider may recommend certain vaccines, such as:  Influenza vaccine. This is recommended every year.  Tetanus, diphtheria, and acellular pertussis (Tdap, Td) vaccine. You may  need a Td booster every 10 years.  Zoster vaccine. You may need this after age 64.  Pneumococcal 13-valent conjugate (PCV13) vaccine. One dose is recommended after age 16.  Pneumococcal polysaccharide (PPSV23) vaccine. One dose is recommended after age 39. Talk to your health care  provider about which screenings and vaccines you need and how often you need them. This information is not intended to replace advice given to you by your health care provider. Make sure you discuss any questions you have with your health care provider. Document Released: 01/26/2015 Document Revised: 09/19/2015 Document Reviewed: 10/31/2014 Elsevier Interactive Patient Education  2017 [employer] Prevention in the Home Falls can cause injuries. They can happen to people of all ages. There are many things you can do to make your home safe and to help prevent falls. What can I do on the outside of my home?  Regularly fix the edges of walkways and driveways and fix any cracks.  Remove anything that might make you trip as you walk through a door, such as a raised step or threshold.  Trim any bushes or trees on the path to your home.  Use bright outdoor lighting.  Clear any walking paths of anything that might make someone trip, such as rocks or tools.  Regularly check to see if handrails are loose or broken. Make sure that both sides of any steps have handrails.  Any raised decks and porches should have guardrails on the edges.  Have any leaves, snow, or ice cleared regularly.  Use sand or salt on walking paths during winter.  Clean up any spills in your garage right away. This includes oil or grease spills. What can I do in the bathroom?  Use night lights.  Install grab bars by the toilet and in the tub and shower. Do not use towel bars as grab bars.  Use non-skid mats or decals in the tub or shower.  If you need to sit down in the shower, use a plastic, non-slip stool.  Keep the floor dry. Clean up any water that spills on the floor as soon as it happens.  Remove soap buildup in the tub or shower regularly.  Attach bath mats securely with double-sided non-slip rug tape.  Do not have throw rugs and other things on the floor that can make you trip. What can I do in  the bedroom?  Use night lights.  Make sure that you have a light by your bed that is easy to reach.  Do not use any sheets or blankets that are too big for your bed. They should not hang down onto the floor.  Have a firm chair that has side arms. You can use this for support while you get dressed.  Do not have throw rugs and other things on the floor that can make you trip. What can I do in the kitchen?  Clean up any spills right away.  Avoid walking on wet floors.  Keep items that you use a lot in easy-to-reach places.  If you need to reach something above you, use a strong step stool that has a grab bar.  Keep electrical cords out of the way.  Do not use floor polish or wax that makes floors slippery. If you must use wax, use non-skid floor wax.  Do not have throw rugs and other things on the floor that can make you trip. What can I do with my stairs?  Do not leave any items on  the stairs.  Make sure that there are handrails on both sides of the stairs and use them. Fix handrails that are broken or loose. Make sure that handrails are as long as the stairways.  Check any carpeting to make sure that it is firmly attached to the stairs. Fix any carpet that is loose or worn.  Avoid having throw rugs at the top or bottom of the stairs. If you do have throw rugs, attach them to the floor with carpet tape.  Make sure that you have a light switch at the top of the stairs and the bottom of the stairs. If you do not have them, ask someone to add them for you. What else can I do to help prevent falls?  Wear shoes that:  Do not have high heels.  Have rubber bottoms.  Are comfortable and fit you well.  Are closed at the toe. Do not wear sandals.  If you use a stepladder:  Make sure that it is fully opened. Do not climb a closed stepladder.  Make sure that both sides of the stepladder are locked into place.  Ask someone to hold it for you, if possible.  Clearly mark and  make sure that you can see:  Any grab bars or handrails.  First and last steps.  Where the edge of each step is.  Use tools that help you move around (mobility aids) if they are needed. These include:  Canes.  Walkers.  Scooters.  Crutches.  Turn on the lights when you go into a dark area. Replace any light bulbs as soon as they burn out.  Set up your furniture so you have a clear path. Avoid moving your furniture around.  If any of your floors are uneven, fix them.  If there are any pets around you, be aware of where they are.  Review your medicines with your doctor. Some medicines can make you feel dizzy. This can increase your chance of falling. Ask your doctor what other things that you can do to help prevent falls. This information is not intended to replace advice given to you by your health care provider. Make sure you discuss any questions you have with your health care provider. Document Released: 10/26/2008 Document Revised: 06/07/2015 Document Reviewed: 02/03/2014 Elsevier Interactive Patient Education  2017 [employer].

## 2019-08-10 ENCOUNTER — Ambulatory Visit: Payer: Medicare PPO | Admitting: Orthopedic Surgery

## 2019-08-10 ENCOUNTER — Other Ambulatory Visit: Payer: Self-pay

## 2019-08-10 ENCOUNTER — Ambulatory Visit: Payer: Medicare PPO

## 2019-08-10 ENCOUNTER — Encounter: Payer: Self-pay | Admitting: Orthopedic Surgery

## 2019-08-10 VITALS — BP 118/60 | HR 68 | Ht 65.0 in | Wt 166.2 lb

## 2019-08-10 DIAGNOSIS — G8929 Other chronic pain: Secondary | ICD-10-CM

## 2019-08-10 DIAGNOSIS — M25562 Pain in left knee: Secondary | ICD-10-CM | POA: Diagnosis not present

## 2019-08-10 NOTE — Progress Notes (Signed)
NEW PROBLEM//OFFICE VISIT  Chief Complaint  Patient presents with  . Knee Pain    L/feels better today, back on 07/16/19 it really bothered me, couldn't put pressure on it then.    69 year old female was working with her husband in the yard on July 2 woke up July 3 with pain swelling left knee trouble weightbearing.  She did take some over-the-counter ibuprofen use ice and rest and eventually the knee started to feel better.  No significant discomfort today just some soreness and stiffness.  No history of trauma   Review of Systems  Constitutional: Negative for fever.  Respiratory: Negative for shortness of breath.   Cardiovascular: Negative for chest pain.  Skin: Negative.   Neurological: Negative for tingling and sensory change.     Past Medical History:  Diagnosis Date  . Allergy   . Arthritis    knee  . Blood transfusion   . Cancer [facility])    right breast T1cN2  . PONV (postoperative nausea and vomiting)     Past Surgical History:  Procedure Laterality Date  . ABDOMINAL HYSTERECTOMY  1984  . BIOPSY  12/12/2016   Procedure: BIOPSY;  Surgeon: Rogene Houston, MD;  Location: [facility];  Service: Endoscopy;;  gastric  . BREAST SURGERY  right MRM, left simple mastectomy   2012  . BREAST SURGERY  left mastectomy   2012  . COLONOSCOPY  05/07/2011   Procedure: COLONOSCOPY;  Surgeon: Rogene Houston, MD;  Location: [facility];  Service: Endoscopy;  Laterality: N/A;  830  . ESOPHAGOGASTRODUODENOSCOPY N/A 12/12/2016   Procedure: ESOPHAGOGASTRODUODENOSCOPY (EGD);  Surgeon: Rogene Houston, MD;  Location: [facility];  Service: Endoscopy;  Laterality: N/A;  9:15  . JOINT REPLACEMENT  11/2009   right knee  . PORT-A-CATH REMOVAL  07/16/10  . TOTAL KNEE ARTHROPLASTY  2011    Family History  Problem Relation Age of Onset  . Prostate cancer Father   . Pancreatic cancer Sister   . Pneumonia Mother   . Hypertension Daughter    Social History   Tobacco Use  . Smoking  status: Never Smoker  . Smokeless tobacco: Never Used  Vaping Use  . Vaping Use: Never used  Substance Use Topics  . Alcohol use: No  . Drug use: No    Allergies  Allergen Reactions  . Codeine Itching    Arms and face    Current Meds  Medication Sig  . acetaminophen (TYLENOL) 500 MG tablet Take 1,000 mg by mouth 2 (two) times daily as needed for headache.   . ergocalciferol (VITAMIN D2) 1.25 MG (50000 UT) capsule Take 1 capsule (50,000 Units total) by mouth once a week. One capsule once weekly  . loratadine (CLARITIN) 10 MG tablet Take 1 tablet (10 mg total) by mouth daily.  . meclizine (ANTIVERT) 12.5 MG tablet Take 1 tablet (12.5 mg total) by mouth 3 (three) times daily as needed for dizziness.  . tamoxifen (NOLVADEX) 20 MG tablet Take 1 tablet (20 mg total) by mouth at bedtime.    BP (!) 118/60   Pulse 68   Ht 5\' 5"  (1.651 m)   Wt 166 lb 4 oz (75.4 kg)   BMI 27.67 kg/m   Physical Exam Constitutional:      General: She is not in acute distress.    Appearance: She is well-developed.  Cardiovascular:     Comments: No peripheral edema Skin:    General: Skin is warm and dry.  Neurological:  Mental Status: She is alert and oriented to person, place, and time.     Sensory: No sensory deficit.     Coordination: Coordination normal.     Gait: Gait normal.     Deep Tendon Reflexes: Reflexes are normal and symmetric.     Right Knee Exam   Tests  Lachman:  Anterior - negative      Comments:  Right knee prior total knee incision full extension flexion 120 degrees knee is stable no effusion   Left Knee Exam   Comments:  Left knee nontender small effusion flexion 125 degrees full extension ligaments stable        MEDICAL DECISION MAKING  A.  Encounter Diagnosis  Name Primary?  . Chronic pain of left knee Yes    B. DATA ANALYSED:    IMAGING: Independent interpretation of images: X-rays were done in the office x-rays show peaking of the tibial  spines small joint effusion mild patellofemoral arthritis  Orders:   Outside records reviewed:   C. MANAGEMENT   Recommend activity modification Ice as needed Ibuprofen when needed If symptoms recur call us back for another appointment  No orders of the defined types were placed in this encounter.  Chronic problem with x-rays done in office and low overall risk   Arther Abbott, MD  08/10/2019 10:07 AM

## 2019-08-10 NOTE — Patient Instructions (Signed)
Use ice/ ibuprofen and modify activity until it feels better If it gets worse let us know and we can see you back

## 2019-09-30 ENCOUNTER — Ambulatory Visit (INDEPENDENT_AMBULATORY_CARE_PROVIDER_SITE_OTHER): Payer: Medicare PPO

## 2019-09-30 ENCOUNTER — Other Ambulatory Visit: Payer: Self-pay

## 2019-09-30 DIAGNOSIS — Z23 Encounter for immunization: Secondary | ICD-10-CM

## 2019-10-12 ENCOUNTER — Telehealth: Payer: Self-pay | Admitting: Family Medicine

## 2019-10-12 NOTE — Telephone Encounter (Signed)
noted 

## 2019-10-12 NOTE — Telephone Encounter (Signed)
FYI  Spoke with patient and advised her to get the booster when she is able. I told her I dont know when it will be available to her, but maybe soon due to hx of breast cancer. Told her to call the health department to see if they are aware of locations that are giving the booster.

## 2019-10-12 NOTE — Telephone Encounter (Signed)
Patient would like to know if she is ok to go ahead and get her covid booster shot and where to go to get it ? [phone]

## 2019-10-20 ENCOUNTER — Ambulatory Visit: Payer: Medicare PPO | Attending: Internal Medicine

## 2019-10-20 DIAGNOSIS — Z23 Encounter for immunization: Secondary | ICD-10-CM

## 2019-10-20 NOTE — Progress Notes (Signed)
   Covid-19 Vaccination Clinic  Name:  Gina Nelson    MRN: [id number] DOB: 01/29/1950  10/20/2019  Gina Nelson was observed post Covid-19 immunization for 15 minutes without incident. She was provided with Vaccine Information Sheet and instruction to access the V-Safe system.   Gina Nelson was instructed to call 911 with any severe reactions post vaccine: Marland Kitchen Difficulty breathing  . Swelling of face and throat  . A fast heartbeat  . A bad rash all over body  . Dizziness and weakness

## 2019-10-24 ENCOUNTER — Telehealth: Payer: Self-pay | Admitting: Oncology

## 2019-10-24 NOTE — Telephone Encounter (Signed)
Called pt per 10/11 sch message - no answer. Left message for patient to call back

## 2019-11-10 ENCOUNTER — Other Ambulatory Visit: Payer: Medicare Other

## 2019-11-10 ENCOUNTER — Ambulatory Visit: Payer: Medicare Other | Admitting: Oncology

## 2019-11-20 ENCOUNTER — Other Ambulatory Visit: Payer: Self-pay | Admitting: Family Medicine

## 2019-11-22 ENCOUNTER — Encounter: Payer: Self-pay | Admitting: Family Medicine

## 2019-11-22 ENCOUNTER — Other Ambulatory Visit: Payer: Self-pay

## 2019-11-22 ENCOUNTER — Ambulatory Visit (INDEPENDENT_AMBULATORY_CARE_PROVIDER_SITE_OTHER): Payer: Medicare PPO | Admitting: Family Medicine

## 2019-11-22 VITALS — BP 132/71 | HR 68 | Resp 16 | Ht 65.0 in | Wt 160.0 lb

## 2019-11-22 DIAGNOSIS — E663 Overweight: Secondary | ICD-10-CM

## 2019-11-22 DIAGNOSIS — E559 Vitamin D deficiency, unspecified: Secondary | ICD-10-CM | POA: Diagnosis not present

## 2019-11-22 DIAGNOSIS — E049 Nontoxic goiter, unspecified: Secondary | ICD-10-CM | POA: Diagnosis not present

## 2019-11-22 DIAGNOSIS — Z13220 Encounter for screening for lipoid disorders: Secondary | ICD-10-CM | POA: Diagnosis not present

## 2019-11-22 DIAGNOSIS — N3000 Acute cystitis without hematuria: Secondary | ICD-10-CM

## 2019-11-22 DIAGNOSIS — E041 Nontoxic single thyroid nodule: Secondary | ICD-10-CM | POA: Diagnosis not present

## 2019-11-22 LAB — POCT URINALYSIS DIP (CLINITEK)
Bilirubin, UA: NEGATIVE
Blood, UA: NEGATIVE
Glucose, UA: NEGATIVE mg/dL
Leukocytes, UA: NEGATIVE
Nitrite, UA: NEGATIVE
POC PROTEIN,UA: NEGATIVE
Spec Grav, UA: >=1.03 — AB (ref 1.010–1.025)
Urobilinogen, UA: 0.2 E.U./dL
pH, UA: 5.5 (ref 5.0–8.0)

## 2019-11-22 NOTE — Patient Instructions (Addendum)
F/U in office as before, cal  If you needed , me before  Please get fasting CBC, lipid, cmp and EGFr, TSH and vit D 1 week before   You haveno sign of urianry tract infection  Aim to  Drink  64 ounces water veer day   Thanks for choosing [facility], we consider it a privelige to serve you.

## 2019-11-23 ENCOUNTER — Encounter: Payer: Self-pay | Admitting: Family Medicine

## 2019-11-23 DIAGNOSIS — N3000 Acute cystitis without hematuria: Secondary | ICD-10-CM | POA: Insufficient documentation

## 2019-11-23 NOTE — Progress Notes (Signed)
   Gina Nelson     MRN: [id number]      DOB: 1950/06/22   HPI Ms. Reicks is here with a 5 day h/o dysuria and frequency, denies fever , chills, flank pain or nausea. Symptoms have improved with excess water intake and voiding, still concerned re possible UTI  ROS See HPI  Denies recent fever or chills. Denies sinus pressure, nasal congestion, ear pain or sore throat. Denies chest congestion, productive cough or wheezing. Denies chest pains, palpitations and leg swelling Denies  nausea, vomiting,diarrhea or constipation.     PE  BP 132/71   Pulse 68   Resp 16   Ht 5\' 5"  (1.651 m)   Wt 160 lb (72.6 kg)   SpO2 97%   BMI 26.63 kg/m   Patient alert and oriented and in no cardiopulmonary distress.  HEENT: No facial asymmetry, EOMI,     Neck supple .  Chest: Clear to auscultation bilaterally.  CVS: S1, S2 no murmurs, no S3.Regular rate.  ABD: Soft no enal angle tendernes Ext: No edema  MS: Adequate ROM spine, shoulders, hips and knees.  Psych: Good eye contact, normal affect. Memory intact not anxious or depressed appearing.  CNS: CN 2-12 intact, power,  normal throughout.no focal deficits noted.   Assessment & Plan  Acute cystitis without hematuria Improved symptoms with conservative management at home and normal CCUA Pt educated re prevention and abortion of UTI, reassured of normal UA, no prescription, no culture.   Overweight (BMI 25.0-29.9)  Patient re-educated about  the importance of commitment to a  minimum of 150 minutes of exercise per week as able.  The importance of healthy food choices with portion control discussed, as well as eating regularly and within a 12 hour window most days. The need to choose "clean , green" food 50 to 75% of the time is discussed, as well as to make water the primary drink and set a goal of 64 ounces water daily.    Weight /BMI 11/22/2019 08/10/2019 08/01/2019  WEIGHT 160 lb 166 lb 4 oz 169 lb  HEIGHT 5\' 5"  5\' 5"  5\' 5"    BMI 26.63 kg/m2 27.67 kg/m2 28.12 kg/m2

## 2019-11-23 NOTE — Assessment & Plan Note (Signed)
Improved symptoms with conservative management at home and normal CCUA Pt educated re prevention and abortion of UTI, reassured of normal UA, no prescription, no culture.

## 2019-11-23 NOTE — Assessment & Plan Note (Signed)
  Patient re-educated about  the importance of commitment to a  minimum of 150 minutes of exercise per week as able.  The importance of healthy food choices with portion control discussed, as well as eating regularly and within a 12 hour window most days. The need to choose "clean , green" food 50 to 75% of the time is discussed, as well as to make water the primary drink and set a goal of 64 ounces water daily.    Weight /BMI 11/22/2019 08/10/2019 08/01/2019  WEIGHT 160 lb 166 lb 4 oz 169 lb  HEIGHT 5\' 5"  5\' 5"  5\' 5"   BMI 26.63 kg/m2 27.67 kg/m2 28.12 kg/m2

## 2019-12-05 NOTE — Progress Notes (Signed)
ID: Gina Nelson   DOB: March 30, 1950  MR#: [id number]  JKD#:[id number]  Patient Care Team: Gina Helper, MD as PCP - General Gina Confer, MD as Consulting Physician (General Surgery) Gina Kind, MD (Inactive) as Consulting Physician (Obstetrics and Gynecology) Gina Nelson, Gina Dad, MD as Consulting Physician (Oncology) Gina Headings, DO as Consulting Physician (Optometry) Rehman, Gina Dawley, MD as Consulting Physician (Gastroenterology)  CHIEF COMPLAINT: right breast cancer (s/p bilateral mastectomies)  CURRENT TREATMENT: tamoxifen    INTERVAL HISTORY: Gina Nelson returns today for follow-up of her estrogen receptor positive breast cancer.   She continues on tamoxifen.  She still has hot flashes from this medication.  She says that she takes it at bedtime and that helps  Since her last visit, she underwent thyroid ultrasound on 12/07/2018 showing: continued stability of right mid gland nodule, marking two years of stability.  She also underwent bone density screening on 07/15/2019 showing a T-score of -0.4, which is considered normal.   REVIEW OF SYSTEMS: Gina Nelson has some discomfort in the lateral most aspect of her incisions where there is a little bit of fat, frequently called it "dog's ear".  She wonders if a different bras might make it more comfortable.  She is not exercising regularly except by doing her usual household chores but she says she is very active every day.  A detailed review of systems was otherwise noncontributory   COVID 19 VACCINATION STATUS: fully vaccinated [employer]), with booster 10/20/2019   BREAST CANCER HISTORY: From the original intake note:  The patient herself noted a mass in her right breast.  She brought it to Dr. Griffin Nelson attention and worked her up for diagnostic mammography and ultrasonography December 29.  Dr. Miquel Nelson was able to palpate a discrete mass in the right breast associated with skin thickening.  By mammography, there was an obscured mass  associated with distortion in that area by ultrasound.  The mass was irregular and hypoechoic measuring 1.9 cm.  In the right axilla, there was an enlarged lymph node measuring 2.1 cm.  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  This appeared to be low grade and strongly ER positive at 100%, PR was 34%, the MIB-1 was 41% and there was no evidence of HER2/neu amplification by CISH with a ratio of 1.58.  With this information, the patient was referred to Dr. Zella Nelson and bilateral breast MRIs were obtained on January 11.  The MRI found a 4.9 cm lobulated, irregular mass with spiculated margins in the right breast at the 7 o'clock location with some skin and trabecular enhancement. There was linear enhancement extending to the nipple, but no apparent extension to the pectoralis muscle.  There were two lymph nodes noted in the right axilla, one measuring 8 mm, the other 6 mm.  There was no internal mammary adenopathy noted and the left breast was unremarkable.  Her subsequent history is as detailed below   PAST MEDICAL HISTORY: Past Medical History:  Diagnosis Date  . Allergy   . Arthritis    knee  . Blood transfusion   . Cancer [facility])    right breast T1cN2  . PONV (postoperative nausea and vomiting)     PAST SURGICAL HISTORY: Past Surgical History:  Procedure Laterality Date  . ABDOMINAL HYSTERECTOMY  1984  . BIOPSY  12/12/2016  Procedure: BIOPSY;  Surgeon: Gina Hippo, MD;  [facility];  Service: Endoscopy;;  gastric  . BREAST SURGERY  right MRM, left simple mastectomy   2012  . BREAST SURGERY  left mastectomy   2012  . COLONOSCOPY  05/07/2011   Procedure: COLONOSCOPY;  Surgeon: Gina Hippo, MD;  [facility];  Service: Endoscopy;  Laterality: N/A;  830   . ESOPHAGOGASTRODUODENOSCOPY N/A 12/12/2016   Procedure: ESOPHAGOGASTRODUODENOSCOPY (EGD);  Surgeon: Gina Hippo, MD;  [facility];  Service: Endoscopy;  Laterality: N/A;  9:15  . JOINT REPLACEMENT  11/2009   right knee  . PORT-A-CATH REMOVAL  07/16/10  . TOTAL KNEE ARTHROPLASTY  2011    FAMILY HISTORY Family History  Problem Relation Age of Onset  . Prostate cancer Father   . Pancreatic cancer Sister   . Pneumonia Mother   . Hypertension Daughter   The patient's father had prostate cancer metastatic to bone. He died at the age of 65. She lost a sister to recurrent pancreatic cancer in 05/2018. There is no history of breast or ovarian cancer in the family to the patient's knowledge.   GYNECOLOGIC HISTORY: Menarche age 68. She stopped having periods of course with her hysterectomy in 1984. She is Gx, P1. She was 18 at the time of that delivery. She used hormone replacement for about two and a half years.   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 Her husband, Gina Nelson, is a retired IT sales professional and currently a Engineer, site. Daughter, Gina Nelson, keeps a home for handicapped girls. Her husband, Gina Nelson, works in the Medical laboratory scientific officer business. The patient has one granddaughter who is in her late teens.    ADVANCED DIRECTIVES: In the absence of any documentation to the contrary, the patient's spouse is their HCPOA.    HEALTH MAINTENANCE: Social History   Tobacco Use  . Smoking status: Never Smoker  . Smokeless tobacco: Never Used  Vaping Use  . Vaping Use: Never used  Substance Use Topics  . Alcohol use: No  . Drug use: No    Allergies  Allergen Reactions  . Codeine Itching    Arms and face    Current Outpatient Medications  Medication Sig Dispense Refill  . acetaminophen (TYLENOL) 500 MG tablet Take 1,000 mg by mouth 2 (two) times daily as needed for headache.     .  ergocalciferol (VITAMIN D2) 1.25 MG (50000 UT) capsule Take 1 capsule (50,000 Units total) by mouth once a week. One capsule once weekly 12 capsule 1  . loratadine (CLARITIN) 10 MG tablet Take 1 tablet (10 mg total) by mouth daily. 90 tablet 0  . meclizine (ANTIVERT) 12.5 MG tablet Take 1 tablet (12.5 mg total) by mouth 3 (three) times daily as needed for dizziness. 21 tablet 0  . tamoxifen (NOLVADEX) 20 MG tablet Take 1 tablet (20 mg total) by mouth at bedtime. 90 tablet 4   No current facility-administered medications for this visit.    OBJECTIVE: African-American woman in no acute distress  Vitals:   12/06/19 1201  BP: (!) 111/57  Pulse: 75  Resp: 18  Temp: 98 F (36.7 C)  SpO2: 100%     Body mass index is 26.54 kg/m.    ECOG FS: 1  Sclerae unicteric, EOMs intact Wearing a mask No cervical or supraclavicular adenopathy Lungs no rales or rhonchi Heart regular rate and rhythm Abd  soft, nontender, positive bowel sounds MSK no focal spinal tenderness, no upper extremity lymphedema Neuro: nonfocal, well oriented, appropriate affect Breasts: Status post bilateral mastectomies.  There is no evidence of local recurrence.  Both axillae are benign.   LAB RESULTS: Lab Results  Component Value Date   WBC 7.6 12/06/2019   NEUTROABS 5.3 12/06/2019   HGB 10.9 (L) 12/06/2019   HCT 35.1 (L) 12/06/2019   MCV 86.7 12/06/2019   PLT 175 12/06/2019      Chemistry      Component Value Date/Time   NA 142 12/06/2019 1128   NA 142 12/08/2016 1228   K 3.8 12/06/2019 1128   K 3.7 12/08/2016 1228   CL 109 12/06/2019 1128   CL 109 (H) 11/27/2011 1454   CO2 26 12/06/2019 1128   CO2 25 12/08/2016 1228   BUN 11 12/06/2019 1128   BUN 10.0 12/08/2016 1228   CREATININE 0.95 12/06/2019 1128   CREATININE 0.91 12/27/2018 0833   CREATININE 0.9 12/08/2016 1228      Component Value Date/Time   CALCIUM 8.9 12/06/2019 1128   CALCIUM 9.2 12/08/2016 1228   ALKPHOS 95 12/06/2019 1128   ALKPHOS 89  12/08/2016 1228   AST 16 12/06/2019 1128   AST 17 12/08/2016 1228   ALT 7 12/06/2019 1128   ALT 8 12/08/2016 1228   BILITOT 0.4 12/06/2019 1128   BILITOT 0.31 12/08/2016 1228       Lab Results  Component Value Date   LABCA2 25 11/27/2011    No components found for: [id number]  No results for input(s): INR in the last 168 hours.  Urinalysis    Component Value Date/Time   COLORURINE YELLOW 11/13/2013 1430   APPEARANCEUR CLEAR 11/13/2013 1430   LABSPEC 1.010 11/13/2013 1430   PHURINE 6.5 11/13/2013 1430   GLUCOSEU NEGATIVE 11/13/2013 1430   HGBUR NEGATIVE 11/13/2013 1430   HGBUR negative 02/21/2009 1509   BILIRUBINUR negative 11/22/2019 0912   BILIRUBINUR small 07/22/2016 1037   KETONESUR trace (5) (A) 11/22/2019 0912   KETONESUR NEGATIVE 11/13/2013 1430   PROTEINUR 100 07/22/2016 1037   PROTEINUR NEGATIVE 11/13/2013 1430   UROBILINOGEN 0.2 11/22/2019 0912   UROBILINOGEN 0.2 11/13/2013 1430   NITRITE Negative 11/22/2019 0912   NITRITE neg 07/22/2016 1037   NITRITE NEGATIVE 11/13/2013 1430   LEUKOCYTESUR Negative 11/22/2019 0912    STUDIES: No results found.    ASSESSMENT: 69 y.o.  [place] woman status post right breast and axillary lymph node biopsy January 2012, both positive for an invasive ductal carcinoma, grade 3, measuring 4.9 cm by MRI, and so stage IIB/IIIA clinically. The tumor was ER positive, PR positive, HER2 negative, with an elevated MIB-1.   (1) She received neoadjuvant docetaxel/ cyclophosphamide x4 followed by doxorubicin/ cyclophosphamide x1, at which point neoadjuvant treatment was discontinued because of poor tolerance  (2) she underwent right modified radical mastectomy with left prophylactic mastectomy June 2012. The left side was benign. On the right, she had 2 areas of residual tumor, one measuring 1.1 cm, the other less than a centimeter, both grade 3, involving 4 out of 13 lymph nodes sampled. Repeat HER2 was again negative, and repeat  estrogen and progesterone receptors were again positive at 94% and 83%, respectively.   (3) adjuvant radiation treatments completed September of 2012, at which time she started letrozole, discontinued because of arthralgias/myalgias  (4) tamoxifen started February 2013, to be continued for a total of 10 years  (5) right mid lobe thyroid nodule, requiring annual  follow-up  (a) ultrasound of the thyroid 10/30/2016 shows a 1.4 cm right nodule requiring annual follow-up  (b) thyroid ultrasound 10/18/2017 stable   PLAN:  Malerie is now a little over 9 years out from definitive surgery for her breast cancer with no evidence of disease recurrence.  This is very favorable.  She is tolerating tamoxifen well and the plan is to continue it 1 more year.  I have written her prescriptions for bras and prostheses so she can get that accomplished in the next couple of weeks.  She will see me 1 year from now and at that point she will be ready to "graduate".  Total encounter time 20 minutes.*  Makyi Ledo, Gina Dad, MD  12/06/19 12:26 PM Medical Oncology and Hematology [facility] [place], [place] [address] Tel. [phone]    Fax. [phone]    I, Wilburn Mylar, am acting as scribe for Dr. Virgie Nelson. Tequisha Maahs.  I, Lurline Del MD, have reviewed the above documentation for accuracy and completeness, and I agree with the above.   *Total Encounter Time as defined by the Centers for Medicare and Medicaid Services includes, in addition to the face-to-face time of a patient visit (documented in the note above) non-face-to-face time: obtaining and reviewing outside history, ordering and reviewing medications, tests or procedures, care coordination (communications with other health care professionals or caregivers) and documentation in the medical record.

## 2019-12-06 ENCOUNTER — Telehealth: Payer: Self-pay | Admitting: Oncology

## 2019-12-06 ENCOUNTER — Other Ambulatory Visit: Payer: Self-pay

## 2019-12-06 ENCOUNTER — Other Ambulatory Visit: Payer: Self-pay | Admitting: Oncology

## 2019-12-06 ENCOUNTER — Inpatient Hospital Stay (HOSPITAL_BASED_OUTPATIENT_CLINIC_OR_DEPARTMENT_OTHER): Payer: Medicare PPO | Admitting: Oncology

## 2019-12-06 ENCOUNTER — Inpatient Hospital Stay: Payer: Medicare PPO | Attending: Oncology

## 2019-12-06 VITALS — BP 111/57 | HR 75 | Temp 98.0°F | Resp 18 | Ht 65.0 in | Wt 159.5 lb

## 2019-12-06 DIAGNOSIS — C50811 Malignant neoplasm of overlapping sites of right female breast: Secondary | ICD-10-CM

## 2019-12-06 DIAGNOSIS — Z170 Estrogen receptor positive status [ER+]: Secondary | ICD-10-CM

## 2019-12-06 DIAGNOSIS — Z9221 Personal history of antineoplastic chemotherapy: Secondary | ICD-10-CM | POA: Diagnosis not present

## 2019-12-06 DIAGNOSIS — Z9013 Acquired absence of bilateral breasts and nipples: Secondary | ICD-10-CM | POA: Diagnosis not present

## 2019-12-06 DIAGNOSIS — Z923 Personal history of irradiation: Secondary | ICD-10-CM | POA: Diagnosis not present

## 2019-12-06 DIAGNOSIS — C50511 Malignant neoplasm of lower-outer quadrant of right female breast: Secondary | ICD-10-CM | POA: Insufficient documentation

## 2019-12-06 DIAGNOSIS — C773 Secondary and unspecified malignant neoplasm of axilla and upper limb lymph nodes: Secondary | ICD-10-CM | POA: Insufficient documentation

## 2019-12-06 DIAGNOSIS — E041 Nontoxic single thyroid nodule: Secondary | ICD-10-CM | POA: Insufficient documentation

## 2019-12-06 LAB — CBC WITH DIFFERENTIAL (CANCER CENTER ONLY)
Abs Immature Granulocytes: 0.02 10*3/uL (ref 0.00–0.07)
Basophils Absolute: 0 10*3/uL (ref 0.0–0.1)
Basophils Relative: 0 %
Eosinophils Absolute: 0.1 10*3/uL (ref 0.0–0.5)
Eosinophils Relative: 1 %
HCT: 35.1 % — ABNORMAL LOW (ref 36.0–46.0)
Hemoglobin: 10.9 g/dL — ABNORMAL LOW (ref 12.0–15.0)
Immature Granulocytes: 0 %
Lymphocytes Relative: 21 %
Lymphs Abs: 1.6 10*3/uL (ref 0.7–4.0)
MCH: 26.9 pg (ref 26.0–34.0)
MCHC: 31.1 g/dL (ref 30.0–36.0)
MCV: 86.7 fL (ref 80.0–100.0)
Monocytes Absolute: 0.6 10*3/uL (ref 0.1–1.0)
Monocytes Relative: 8 %
Neutro Abs: 5.3 10*3/uL (ref 1.7–7.7)
Neutrophils Relative %: 70 %
Platelet Count: 175 10*3/uL (ref 150–400)
RBC: 4.05 MIL/uL (ref 3.87–5.11)
RDW: 15.5 % (ref 11.5–15.5)
WBC Count: 7.6 10*3/uL (ref 4.0–10.5)
nRBC: 0 % (ref 0.0–0.2)

## 2019-12-06 LAB — CMP (CANCER CENTER ONLY)
ALT: 7 U/L (ref 0–44)
AST: 16 U/L (ref 15–41)
Albumin: 3.4 g/dL — ABNORMAL LOW (ref 3.5–5.0)
Alkaline Phosphatase: 95 U/L (ref 38–126)
Anion gap: 7 (ref 5–15)
BUN: 11 mg/dL (ref 8–23)
CO2: 26 mmol/L (ref 22–32)
Calcium: 8.9 mg/dL (ref 8.9–10.3)
Chloride: 109 mmol/L (ref 98–111)
Creatinine: 0.95 mg/dL (ref 0.44–1.00)
GFR, Estimated: >60 mL/min (ref >60)
Glucose, Bld: 95 mg/dL (ref 70–99)
Potassium: 3.8 mmol/L (ref 3.5–5.1)
Sodium: 142 mmol/L (ref 135–145)
Total Bilirubin: 0.4 mg/dL (ref 0.3–1.2)
Total Protein: 6.6 g/dL (ref 6.5–8.1)

## 2019-12-06 MED ORDER — TAMOXIFEN CITRATE 20 MG PO TABS
20.0000 mg | ORAL_TABLET | Freq: Every day | ORAL | 4 refills | Status: DC
Start: 2019-12-06 — End: 2021-01-02

## 2019-12-06 NOTE — Telephone Encounter (Signed)
Scheduled appts per 11/23 los. Gave pt a print out of AVS.  

## 2020-01-09 ENCOUNTER — Encounter: Payer: Medicare PPO | Admitting: Family Medicine

## 2020-01-19 DIAGNOSIS — E559 Vitamin D deficiency, unspecified: Secondary | ICD-10-CM | POA: Diagnosis not present

## 2020-01-19 DIAGNOSIS — E049 Nontoxic goiter, unspecified: Secondary | ICD-10-CM | POA: Diagnosis not present

## 2020-01-19 DIAGNOSIS — E663 Overweight: Secondary | ICD-10-CM | POA: Diagnosis not present

## 2020-01-19 DIAGNOSIS — E041 Nontoxic single thyroid nodule: Secondary | ICD-10-CM | POA: Diagnosis not present

## 2020-01-19 DIAGNOSIS — Z13220 Encounter for screening for lipoid disorders: Secondary | ICD-10-CM | POA: Diagnosis not present

## 2020-01-20 LAB — CBC
Hematocrit: 37.1 % (ref 34.0–46.6)
Hemoglobin: 11.5 g/dL (ref 11.1–15.9)
MCH: 26.6 pg (ref 26.6–33.0)
MCHC: 31 g/dL — ABNORMAL LOW (ref 31.5–35.7)
MCV: 86 fL (ref 79–97)
Platelets: 186 10*3/uL (ref 150–450)
RBC: 4.33 x10E6/uL (ref 3.77–5.28)
RDW: 13.5 % (ref 11.7–15.4)
WBC: 7.6 10*3/uL (ref 3.4–10.8)

## 2020-01-20 LAB — LIPID PANEL
Chol/HDL Ratio: 2.7 ratio (ref 0.0–4.4)
Cholesterol, Total: 180 mg/dL (ref 100–199)
HDL: 67 mg/dL (ref >39)
LDL Chol Calc (NIH): 98 mg/dL (ref 0–99)
Triglycerides: 83 mg/dL (ref 0–149)
VLDL Cholesterol Cal: 15 mg/dL (ref 5–40)

## 2020-01-20 LAB — CMP14+EGFR
ALT: 9 IU/L (ref 0–32)
AST: 24 IU/L (ref 0–40)
Albumin/Globulin Ratio: 1.7 (ref 1.2–2.2)
Albumin: 4.1 g/dL (ref 3.8–4.8)
Alkaline Phosphatase: 108 IU/L (ref 44–121)
BUN/Creatinine Ratio: 11 — ABNORMAL LOW (ref 12–28)
BUN: 10 mg/dL (ref 8–27)
Bilirubin Total: 0.4 mg/dL (ref 0.0–1.2)
CO2: 22 mmol/L (ref 20–29)
Calcium: 9 mg/dL (ref 8.7–10.3)
Chloride: 103 mmol/L (ref 96–106)
Creatinine, Ser: 0.92 mg/dL (ref 0.57–1.00)
GFR calc Af Amer: 73 mL/min/{1.73_m2} (ref >59)
GFR calc non Af Amer: 64 mL/min/{1.73_m2} (ref >59)
Globulin, Total: 2.4 g/dL (ref 1.5–4.5)
Glucose: 79 mg/dL (ref 65–99)
Potassium: 4 mmol/L (ref 3.5–5.2)
Sodium: 143 mmol/L (ref 134–144)
Total Protein: 6.5 g/dL (ref 6.0–8.5)

## 2020-01-20 LAB — VITAMIN D 25 HYDROXY (VIT D DEFICIENCY, FRACTURES): Vit D, 25-Hydroxy: 28.4 ng/mL — ABNORMAL LOW (ref 30.0–100.0)

## 2020-01-20 LAB — TSH: TSH: 1.09 u[IU]/mL (ref 0.450–4.500)

## 2020-01-26 ENCOUNTER — Encounter: Payer: Medicare PPO | Admitting: Family Medicine

## 2020-01-27 ENCOUNTER — Encounter: Payer: Self-pay | Admitting: Internal Medicine

## 2020-01-27 ENCOUNTER — Ambulatory Visit (INDEPENDENT_AMBULATORY_CARE_PROVIDER_SITE_OTHER): Payer: Medicare PPO | Admitting: Internal Medicine

## 2020-01-27 ENCOUNTER — Other Ambulatory Visit: Payer: Self-pay

## 2020-01-27 VITALS — BP 117/79 | HR 72 | Temp 98.1°F | Resp 18 | Ht 65.0 in | Wt 161.4 lb

## 2020-01-27 DIAGNOSIS — Z0000 Encounter for general adult medical examination without abnormal findings: Secondary | ICD-10-CM

## 2020-01-27 DIAGNOSIS — C50811 Malignant neoplasm of overlapping sites of right female breast: Secondary | ICD-10-CM | POA: Diagnosis not present

## 2020-01-27 DIAGNOSIS — Z170 Estrogen receptor positive status [ER+]: Secondary | ICD-10-CM | POA: Diagnosis not present

## 2020-01-27 NOTE — Patient Instructions (Signed)
Please continue to take medications as prescribed. ? ?Please continue to follow heart healthy diet and perform moderate exercise/walking as tolerated. ?

## 2020-01-27 NOTE — Assessment & Plan Note (Signed)
Annual exam as documented. Counseling done  re healthy lifestyle involving commitment to 150 minutes exercise per week, heart healthy diet, and attaining healthy weight.The importance of adequate sleep also discussed. Changes in health habits are decided on by the patient with goals and time frames  set for achieving them. Immunization and cancer screening needs are specifically addressed at this visit. 

## 2020-01-27 NOTE — Progress Notes (Signed)
Established Patient Office Visit  Subjective:  Patient ID: Gina Nelson, female    DOB: 12-13-1950  Age: 70 y.o. MRN: [id number]  CC:  Chief Complaint  Patient presents with  . Annual Exam    Annual exam no problems at this time     HPI Gina Nelson is a 70 year old female with PMH of breast cancer s/p b/l mastectomy who presents for annual physical.  She states that she has been in good health overall. Her blood tests are reviewed and discussed with the patient in detail.  She is up-to-date with COVID, flu and Pneumonia vaccine.  Past Medical History:  Diagnosis Date  . Allergy   . Arthritis    knee  . Blood transfusion   . Cancer [facility])    right breast T1cN2  . PONV (postoperative nausea and vomiting)     Past Surgical History:  Procedure Laterality Date  . ABDOMINAL HYSTERECTOMY  1984  . BIOPSY  12/12/2016   Procedure: BIOPSY;  Surgeon: Rogene Houston, MD;  [facility];  Service: Endoscopy;;  gastric  . BREAST SURGERY  right MRM, left simple mastectomy   2012  . BREAST SURGERY  left mastectomy   2012  . COLONOSCOPY  05/07/2011   Procedure: COLONOSCOPY;  Surgeon: Rogene Houston, MD;  [facility];  Service: Endoscopy;  Laterality: N/A;  830  . ESOPHAGOGASTRODUODENOSCOPY N/A 12/12/2016   Procedure: ESOPHAGOGASTRODUODENOSCOPY (EGD);  Surgeon: Rogene Houston, MD;  [facility];  Service: Endoscopy;  Laterality: N/A;  9:15  . JOINT REPLACEMENT  11/2009   right knee  . PORT-A-CATH REMOVAL  07/16/10  . TOTAL KNEE ARTHROPLASTY  2011    Family History  Problem Relation Age of Onset  . Prostate cancer Father   . Pancreatic cancer Sister   . Pneumonia Mother   . Hypertension Daughter     Social History   Socioeconomic History  . Marital status: Married    Spouse name: Izell Warren  . Number of children: 1  . Years of education: Not on file  . Highest education level: 12th grade  Occupational History  . Occupation: retired   Tobacco Use  . Smoking status: Never Smoker  . Smokeless tobacco: Never Used  Vaping Use  . Vaping Use: Never used  Substance and Sexual Activity  . Alcohol use: No  . Drug use: No  . Sexual activity: Yes    Birth control/protection: Surgical  Other Topics Concern  . Not on file  Social History Narrative   Has been married 60 years. Husband is a Company secretary. She is retired.Visits sick people in the hospital. Enjoys word search puzzles.   Social Determinants of Health   Financial Resource Strain: Low Risk   . Difficulty of Paying Living Expenses: Not hard at all  Food Insecurity: No Food Insecurity  . Worried About Charity fundraiser in the Last Year: Never true  . Ran Out of Food in the Last Year: Never true  Transportation Needs: No Transportation Needs  . Lack of Transportation (Medical): No  . Lack of Transportation (Non-Medical): No  Physical Activity: Sufficiently Active  . Days of Exercise per Week: 4 days  . Minutes of Exercise per Session: 60 min  Stress: No Stress Concern Present  . Feeling of Stress : Not at all  Social Connections: Moderately Integrated  . Frequency of Communication with Friends and Family: More than three times a week  . Frequency of Social Gatherings  with Friends and Family: More than three times a week  . Attends Religious Services: More than 4 times per year  . Active Member of Clubs or Organizations: No  . Attends Archivist Meetings: Never  . Marital Status: Married  Human resources officer Violence: Not on file    Outpatient Medications Prior to Visit  Medication Sig Dispense Refill  . acetaminophen (TYLENOL) 500 MG tablet Take 1,000 mg by mouth 2 (two) times daily as needed for headache.     . ergocalciferol (VITAMIN D2) 1.25 MG (50000 UT) capsule Take 1 capsule (50,000 Units total) by mouth once a week. One capsule once weekly 12 capsule 1  . loratadine (CLARITIN) 10 MG tablet Take 1 tablet (10 mg total) by mouth daily. 90 tablet 0   . meclizine (ANTIVERT) 12.5 MG tablet Take 1 tablet (12.5 mg total) by mouth 3 (three) times daily as needed for dizziness. 21 tablet 0  . tamoxifen (NOLVADEX) 20 MG tablet Take 1 tablet (20 mg total) by mouth at bedtime. 90 tablet 4   No facility-administered medications prior to visit.    Allergies  Allergen Reactions  . Codeine Itching    Arms and face    ROS Review of Systems  Constitutional: Negative for chills and fever.  HENT: Negative for congestion, sinus pressure, sinus pain and sore throat.   Eyes: Negative for pain and discharge.  Respiratory: Negative for cough and shortness of breath.   Cardiovascular: Negative for chest pain and palpitations.  Gastrointestinal: Negative for abdominal pain, constipation, diarrhea, nausea and vomiting.  Endocrine: Negative for polydipsia and polyuria.  Genitourinary: Negative for dysuria and hematuria.  Musculoskeletal: Negative for neck pain and neck stiffness.  Skin: Negative for rash.  Neurological: Negative for dizziness and weakness.  Psychiatric/Behavioral: Negative for agitation and behavioral problems.      Objective:    Physical Exam Vitals reviewed.  Constitutional:      General: She is not in acute distress.    Appearance: She is not diaphoretic.  HENT:     Head: Normocephalic and atraumatic.     Nose: Nose normal. No congestion.     Mouth/Throat:     Mouth: Mucous membranes are moist.     Pharynx: No posterior oropharyngeal erythema.  Eyes:     General: No scleral icterus.    Extraocular Movements: Extraocular movements intact.     Pupils: Pupils are equal, round, and reactive to light.  Cardiovascular:     Rate and Rhythm: Normal rate and regular rhythm.     Pulses: Normal pulses.     Heart sounds: Normal heart sounds. No murmur heard.   Pulmonary:     Breath sounds: Normal breath sounds. No wheezing or rales.  Abdominal:     Palpations: Abdomen is soft.     Tenderness: There is no abdominal  tenderness.  Musculoskeletal:     Cervical back: Neck supple. No tenderness.     Right lower leg: No edema.     Left lower leg: No edema.  Skin:    General: Skin is warm.     Findings: No rash.  Neurological:     General: No focal deficit present.     Mental Status: She is alert and oriented to person, place, and time.     Cranial Nerves: No cranial nerve deficit.     Sensory: No sensory deficit.     Motor: No weakness.  Psychiatric:        Mood and Affect: Mood  normal.        Behavior: Behavior normal.     BP 117/79 (BP Location: Right Arm, Patient Position: Sitting, Cuff Size: Normal)   Pulse 72   Temp 98.1 F (36.7 C) (Oral)   Resp 18   Ht $R'5\' 5"'wO$  (1.651 m)   Wt 161 lb 6.4 oz (73.2 kg)   SpO2 98%   BMI 26.86 kg/m  Wt Readings from Last 3 Encounters:  01/27/20 161 lb 6.4 oz (73.2 kg)  12/06/19 159 lb 8 oz (72.3 kg)  11/22/19 160 lb (72.6 kg)     Health Maintenance Due  Topic Date Due  . TETANUS/TDAP  08/07/2015    There are no preventive care reminders to display for this patient.  Lab Results  Component Value Date   TSH 1.090 01/19/2020   Lab Results  Component Value Date   WBC 7.6 01/19/2020   HGB 11.5 01/19/2020   HCT 37.1 01/19/2020   MCV 86 01/19/2020   PLT 186 01/19/2020   Lab Results  Component Value Date   NA 143 01/19/2020   K 4.0 01/19/2020   CHLORIDE 108 12/08/2016   CO2 22 01/19/2020   GLUCOSE 79 01/19/2020   BUN 10 01/19/2020   CREATININE 0.92 01/19/2020   BILITOT 0.4 01/19/2020   ALKPHOS 108 01/19/2020   AST 24 01/19/2020   ALT 9 01/19/2020   PROT 6.5 01/19/2020   ALBUMIN 4.1 01/19/2020   CALCIUM 9.0 01/19/2020   ANIONGAP 7 12/06/2019   EGFR >60 12/08/2016   Lab Results  Component Value Date   CHOL 180 01/19/2020   Lab Results  Component Value Date   HDL 67 01/19/2020   Lab Results  Component Value Date   LDLCALC 98 01/19/2020   Lab Results  Component Value Date   TRIG 83 01/19/2020   Lab Results  Component  Value Date   CHOLHDL 2.7 01/19/2020   Lab Results  Component Value Date   HGBA1C 5.5 04/17/2016      Assessment & Plan:   Problem List Items Addressed This Visit      Other   Encounter for annual physical exam - Primary    Annual exam as documented. Counseling done  re healthy lifestyle involving commitment to 150 minutes exercise per week, heart healthy diet, and attaining healthy weight.The importance of adequate sleep also discussed. Changes in health habits are decided on by the patient with goals and time frames  set for achieving them. Immunization and cancer screening needs are specifically addressed at this visit.      Malignant neoplasm of overlapping sites of right breast in female, estrogen receptor positive ([place])    On Tamoxifen Follows up with Oncologist         No orders of the defined types were placed in this encounter.   Follow-up: Return in about 6 months (around 07/26/2020).    Lindell Spar, MD

## 2020-01-27 NOTE — Assessment & Plan Note (Signed)
On Tamoxifen Follows up with Oncologist

## 2020-02-21 ENCOUNTER — Other Ambulatory Visit: Payer: Self-pay | Admitting: Oncology

## 2020-02-21 ENCOUNTER — Other Ambulatory Visit: Payer: Medicare PPO

## 2020-02-21 DIAGNOSIS — Z20822 Contact with and (suspected) exposure to covid-19: Secondary | ICD-10-CM

## 2020-02-22 LAB — SARS-COV-2, NAA 2 DAY TAT

## 2020-02-22 LAB — NOVEL CORONAVIRUS, NAA: SARS-CoV-2, NAA: NOT DETECTED

## 2020-04-18 DIAGNOSIS — Z9012 Acquired absence of left breast and nipple: Secondary | ICD-10-CM | POA: Diagnosis not present

## 2020-04-18 DIAGNOSIS — C50911 Malignant neoplasm of unspecified site of right female breast: Secondary | ICD-10-CM | POA: Diagnosis not present

## 2020-04-30 ENCOUNTER — Other Ambulatory Visit (HOSPITAL_BASED_OUTPATIENT_CLINIC_OR_DEPARTMENT_OTHER): Payer: Self-pay

## 2020-04-30 ENCOUNTER — Ambulatory Visit: Payer: Medicare PPO | Attending: Internal Medicine

## 2020-04-30 DIAGNOSIS — Z23 Encounter for immunization: Secondary | ICD-10-CM

## 2020-04-30 MED ORDER — COVID-19 MRNA VACCINE (PFIZER) 30 MCG/0.3ML IM SUSP
INTRAMUSCULAR | 0 refills | Status: DC
  Filled 2020-04-30: qty 0.3, 1d supply, fill #0

## 2020-04-30 NOTE — Progress Notes (Signed)
   Covid-19 Vaccination Clinic  Name:  [name]    MRN: [id number] DOB: 04/16/1950  04/30/2020  [name] was observed post Covid-19 immunization for 15 minutes without incident. She was provided with Vaccine Information Sheet and instruction to access the V-Safe system.   [name] was instructed to call 911 with any severe reactions post vaccine: Marland Kitchen Difficulty breathing  . Swelling of face and throat  . A fast heartbeat  . A bad rash all over body  . Dizziness and weakness   Immunizations Administered    Name Date Dose VIS Date Route   PFIZER Comrnaty(Gray TOP) Covid-19 Vaccine 04/30/2020  1:38 PM 0.3 mL 12/22/2019 Intramuscular   Manufacturer: [employer]   Lot: [id number]   NDC: [phone]

## 2020-06-07 ENCOUNTER — Encounter (HOSPITAL_COMMUNITY): Payer: Self-pay

## 2020-06-07 ENCOUNTER — Emergency Department (HOSPITAL_COMMUNITY)
Admission: EM | Admit: 2020-06-07 | Discharge: 2020-06-07 | Disposition: A | Discharge status: Home / Self Care | Payer: Medicare PPO | Attending: Emergency Medicine | Admitting: Emergency Medicine

## 2020-06-07 ENCOUNTER — Other Ambulatory Visit: Payer: Self-pay

## 2020-06-07 ENCOUNTER — Emergency Department (HOSPITAL_COMMUNITY): Payer: Medicare PPO

## 2020-06-07 DIAGNOSIS — Z853 Personal history of malignant neoplasm of breast: Secondary | ICD-10-CM | POA: Diagnosis not present

## 2020-06-07 DIAGNOSIS — I2510 Atherosclerotic heart disease of native coronary artery without angina pectoris: Secondary | ICD-10-CM | POA: Diagnosis not present

## 2020-06-07 DIAGNOSIS — R21 Rash and other nonspecific skin eruption: Secondary | ICD-10-CM | POA: Diagnosis not present

## 2020-06-07 DIAGNOSIS — Z96651 Presence of right artificial knee joint: Secondary | ICD-10-CM | POA: Insufficient documentation

## 2020-06-07 DIAGNOSIS — R079 Chest pain, unspecified: Secondary | ICD-10-CM | POA: Insufficient documentation

## 2020-06-07 DIAGNOSIS — R0789 Other chest pain: Secondary | ICD-10-CM | POA: Diagnosis not present

## 2020-06-07 DIAGNOSIS — I517 Cardiomegaly: Secondary | ICD-10-CM | POA: Diagnosis not present

## 2020-06-07 LAB — CBC
HCT: 38.9 % (ref 36.0–46.0)
Hemoglobin: 11.8 g/dL — ABNORMAL LOW (ref 12.0–15.0)
MCH: 26.5 pg (ref 26.0–34.0)
MCHC: 30.3 g/dL (ref 30.0–36.0)
MCV: 87.4 fL (ref 80.0–100.0)
Platelets: 178 10*3/uL (ref 150–400)
RBC: 4.45 MIL/uL (ref 3.87–5.11)
RDW: 15.4 % (ref 11.5–15.5)
WBC: 7.6 10*3/uL (ref 4.0–10.5)
nRBC: 0 % (ref 0.0–0.2)

## 2020-06-07 LAB — BASIC METABOLIC PANEL
Anion gap: 5 (ref 5–15)
BUN: 11 mg/dL (ref 8–23)
CO2: 27 mmol/L (ref 22–32)
Calcium: 8.8 mg/dL — ABNORMAL LOW (ref 8.9–10.3)
Chloride: 107 mmol/L (ref 98–111)
Creatinine, Ser: 0.91 mg/dL (ref 0.44–1.00)
GFR, Estimated: >60 mL/min (ref >60)
Glucose, Bld: 102 mg/dL — ABNORMAL HIGH (ref 70–99)
Potassium: 3.7 mmol/L (ref 3.5–5.1)
Sodium: 139 mmol/L (ref 135–145)

## 2020-06-07 LAB — TROPONIN I (HIGH SENSITIVITY): Troponin I (High Sensitivity): 2 ng/L (ref <18)

## 2020-06-07 MED ORDER — NAPROXEN 500 MG PO TABS: 500.0000 mg | ORAL_TABLET | Freq: Two times a day (BID) | ORAL | 0 refills | Status: DC

## 2020-06-07 MED ORDER — NAPROXEN 250 MG PO TABS
500.0000 mg | ORAL_TABLET | Freq: Once | ORAL | Status: AC
  Administered 2020-06-07: 500 mg via ORAL
  Filled 2020-06-07: qty 2

## 2020-06-07 NOTE — ED Notes (Signed)
Patient transported to X-ray 

## 2020-06-07 NOTE — Discharge Instructions (Signed)
Take naproxen twice a day as needed for pain, return to the emergency department for severe or worsening symptoms.  This includes increasing chest pain difficulty breathing palpitations or anything else that is concerning.  I have given you the phone number for the cardiology office, please call to make an next available appointment

## 2020-06-07 NOTE — ED Triage Notes (Addendum)
Pt complains of sudden chest pain around 5 pm. Describes pain as sharp to the left side of chest. Took aspirin about 30 mins ago.

## 2020-06-07 NOTE — ED Provider Notes (Signed)
[facility] EMERGENCY DEPARTMENT Provider Note   CSN: [id number] Arrival date & time: 06/07/20  2026     History Chief Complaint  Patient presents with  . Chest Pain    Gina Nelson is a 70 y.o. female.  HPI   70 year old female, no prior history of coronary disease or lung disease, no history of blood clot and the only medication that she takes is nightly tamoxifen for her history of breast cancer status post bilateral mastectomy 10 years ago.  The patient presents to the hospital with a complaint of chest pain, left-sided, sharp and stabbing, last about 3 4 or 5 seconds and then resolves, comes back within 10 minutes.  She has not had it since arrival over the last 20 minutes.  This started tonight about 3 hours ago.  She has never had anything like this, it came on while she was resting in the car, she does not have any exertional symptoms in the past, she has never had any coronary disease that she knows of and denies any swelling of the legs.  No fevers chills nausea vomiting or diarrhea, she has not been coughing or having any infectious symptoms.  Nobody around her has been sick.  She is taken no medications prior to arrival.  She is currently pain-free  Past Medical History:  Diagnosis Date  . Allergy   . Arthritis    knee  . Blood transfusion   . Cancer [facility])    right breast T1cN2  . PONV (postoperative nausea and vomiting)     Patient Active Problem List   Diagnosis Date Noted  . Acute cystitis without hematuria 11/23/2019  . Lumbar spine pain 08/24/2017  . Malignant neoplasm of overlapping sites of right breast in female, estrogen receptor positive ([place]) 11/03/2016  . Right thyroid nodule 10/23/2016  . Dyspepsia 10/16/2016  . Encounter for annual physical exam 04/24/2015  . Postmenopause atrophic vaginitis 11/23/2014  . Overweight (BMI 25.0-29.9) 07/26/2014  . Prediabetes 09/26/2012  . Seasonal allergies 04/30/2011  . Breast cancer-right T2N1. 07/29/2010  .  Vitamin D deficiency 01/11/2009  . 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 ABDOMINAL HYSTERECTOMY  1984  . BIOPSY  12/12/2016   Procedure: BIOPSY;  Surgeon: Rogene Houston, MD;  Location: [facility];  Service: Endoscopy;;  gastric  . BREAST SURGERY  right MRM, left simple mastectomy   2012  . BREAST SURGERY  left mastectomy   2012  . COLONOSCOPY  05/07/2011   Procedure: COLONOSCOPY;  Surgeon: Rogene Houston, MD;  Location: [facility] ENDO SUITE;  Service: Endoscopy;  Laterality: N/A;  830  . ESOPHAGOGASTRODUODENOSCOPY N/A 12/12/2016   Procedure: ESOPHAGOGASTRODUODENOSCOPY (EGD);  Surgeon: Rogene Houston, MD;  Location: [facility] ENDO SUITE;  Service: Endoscopy;  Laterality: N/A;  9:15  . JOINT REPLACEMENT  11/2009   right knee  . PORT-A-CATH REMOVAL  07/16/10  . TOTAL KNEE ARTHROPLASTY  2011     OB History   No obstetric history on file.     Family History  Problem Relation Age of Onset  . Prostate cancer Father   . Pancreatic cancer Sister   . Pneumonia Mother   . Hypertension Daughter     Social History   Tobacco Use  . Smoking status: Never Smoker  . Smokeless tobacco: Never Used  Vaping Use  . Vaping Use: Never used  Substance Use Topics  . Alcohol use: No  . Drug use: No  Home Medications Prior to Admission medications   Medication Sig Start Date End Date Taking? Authorizing Provider  naproxen (NAPROSYN) 500 MG tablet Take 1 tablet (500 mg total) by mouth 2 (two) times daily with a meal. 06/07/20  Yes Noemi Chapel, MD  acetaminophen (TYLENOL) 500 MG tablet Take 1,000 mg by mouth 2 (two) times daily as needed for headache.     [provider]  COVID-19 mRNA vaccine, Pfizer, 30 MCG/0.3ML injection Inject into the muscle. 04/30/20   Carlyle Basques, MD  EQ LORATADINE 10 MG tablet Take 1 tablet by mouth once daily 02/22/20   Magrinat, Virgie Dad, MD  ergocalciferol (VITAMIN D2) 1.25 MG (50000 UT) capsule Take 1 capsule (50,000  Units total) by mouth once a week. One capsule once weekly 07/04/19   [name], MD  meclizine (ANTIVERT) 12.5 MG tablet Take 1 tablet (12.5 mg total) by mouth 3 (three) times daily as needed for dizziness. 06/08/19   [name], MD  tamoxifen (NOLVADEX) 20 MG tablet Take 1 tablet (20 mg total) by mouth at bedtime. 12/06/19   Magrinat, Virgie Dad, MD    Allergies    Codeine  Review of Systems   Review of Systems  All other systems reviewed and are negative.   Physical Exam Updated Vital Signs BP 128/68   Pulse 60   Temp 98.3 F (36.8 C) (Oral)   Resp 13   Ht 1.651 m (5\' 5" )   Wt 73.5 kg   SpO2 98%   BMI 26.96 kg/m   Physical Exam Vitals and nursing note reviewed.  Constitutional:      General: She is not in acute distress.    Appearance: She is well-developed.  HENT:     Head: Normocephalic and atraumatic.     Mouth/Throat:     Pharynx: No oropharyngeal exudate.  Eyes:     General: No scleral icterus.       Right eye: No discharge.        Left eye: No discharge.     Conjunctiva/sclera: Conjunctivae normal.     Pupils: Pupils are equal, round, and reactive to light.  Neck:     Thyroid: No thyromegaly.     Vascular: No JVD.  Cardiovascular:     Rate and Rhythm: Normal rate and regular rhythm.     Heart sounds: Normal heart sounds. No murmur heard. No friction rub. No gallop.   Pulmonary:     Effort: Pulmonary effort is normal. No respiratory distress.     Breath sounds: Normal breath sounds. No wheezing or rales.  Abdominal:     General: Bowel sounds are normal. There is no distension.     Palpations: Abdomen is soft. There is no mass.     Tenderness: There is no abdominal tenderness.  Musculoskeletal:        General: No tenderness. Normal range of motion.     Cervical back: Normal range of motion and neck supple.  Lymphadenopathy:     Cervical: No cervical adenopathy.  Skin:    General: Skin is warm and dry.     Findings: No erythema or  rash.  Neurological:     Mental Status: She is alert.     Coordination: Coordination normal.  Psychiatric:        Behavior: Behavior normal.     ED Results / Procedures / Treatments   Labs (all labs ordered are listed, but only abnormal results are displayed) Labs Reviewed  CBC - Abnormal; Notable  for the following components:      Result Value   Hemoglobin 11.8 (*)    All other components within normal limits  BASIC METABOLIC PANEL - Abnormal; Notable for the following components:   Glucose, Bld 102 (*)    Calcium 8.8 (*)    All other components within normal limits  TROPONIN I (HIGH SENSITIVITY)    EKG EKG Interpretation  Date/Time:  Thursday Jun 07 2020 20:34:26 EDT Ventricular Rate:  73 PR Interval:  157 QRS Duration: 110 QT Interval:  390 QTC Calculation: 430 R Axis:   22 Text Interpretation: Sinus rhythm RSR' in V1 or V2, right VCD or RVH Baseline wander in lead(s) V1 since last tracing 4/17, no changes are seen Confirmed by Noemi Chapel [phone]) on 06/07/2020 8:37:47 PM   Radiology DG Chest 2 View  Result Date: 06/07/2020 CLINICAL DATA:  70 year old female with left-sided chest pain. EXAM: CHEST - 2 VIEW COMPARISON:  Chest radiograph dated 10/16/2016. FINDINGS: Cardiomegaly with mild central vascular congestion. No focal consolidation, pleural effusion or pneumothorax. No acute osseous pathology. IMPRESSION: Cardiomegaly with mild central vascular congestion. No focal consolidation. Electronically Signed   By: Anner Crete M.D.   On: 06/07/2020 21:28    Procedures Procedures   Medications Ordered in ED Medications  naproxen (NAPROSYN) tablet 500 mg (has no administration in time range)    ED Course  I have reviewed the triage vital signs and the nursing notes.  Pertinent labs & imaging results that were available during my care of the patient were reviewed by me and considered in my medical decision making (see chart for details).    MDM  Rules/Calculators/A&P                          Well-appearing, EKG is totally unremarkable, no tachycardia no edema no hypoxia no tachypnea, no fever, the patient is low risk for coronary disease, low but no risk for PE however her symptoms are very intermittent.  This seems more like pleurisy, will check a chest x-ray and lab work, she is agreeable to the plan  X-ray negative except for cardiomegaly, my bedside ultrasound reveals no signs of an effusion.  Normal blood counts, no leukocytosis, metabolic panel is reassuring without any signs of electrolyte or renal dysfunction, troponin is negative as suspected.  No arrhythmias on the monitor, the patient was given all of these results and encouraged to follow-up in the outpatient setting, she is agreeable.  Final Clinical Impression(s) / ED Diagnoses Final diagnoses:  Intermittent left-sided chest pain    Rx / DC Orders ED Discharge Orders         Ordered    naproxen (NAPROSYN) 500 MG tablet  2 times daily with meals        06/07/20 2153           Noemi Chapel, MD 06/07/20 2155

## 2020-06-20 ENCOUNTER — Ambulatory Visit (INDEPENDENT_AMBULATORY_CARE_PROVIDER_SITE_OTHER): Payer: Medicare PPO | Admitting: Internal Medicine

## 2020-06-20 ENCOUNTER — Encounter: Payer: Self-pay | Admitting: Internal Medicine

## 2020-06-20 ENCOUNTER — Other Ambulatory Visit: Payer: Self-pay

## 2020-06-20 VITALS — BP 120/60 | HR 68 | Ht 65.0 in | Wt 167.0 lb

## 2020-06-20 DIAGNOSIS — I517 Cardiomegaly: Secondary | ICD-10-CM | POA: Diagnosis not present

## 2020-06-20 DIAGNOSIS — R9389 Abnormal findings on diagnostic imaging of other specified body structures: Secondary | ICD-10-CM | POA: Diagnosis not present

## 2020-06-20 NOTE — Patient Instructions (Signed)
Medication Instructions:  Your physician recommends that you continue on your current medications as directed. Please refer to the Current Medication list given to you today.  *If you need a refill on your cardiac medications before your next appointment, please call your pharmacy*   Lab Work: NONE   If you have labs (blood work) drawn today and your tests are completely normal, you will receive your results only by: Marland Kitchen MyChart Message (if you have MyChart) OR . A paper copy in the mail If you have any lab test that is abnormal or we need to change your treatment, we will call you to review the results.   Testing/Procedures: Your physician has requested that you have an echocardiogram. Echocardiography is a painless test that uses sound waves to create images of your heart. It provides your doctor with information about the size and shape of your heart and how well your heart's chambers and valves are working. This procedure takes approximately one hour. There are no restrictions for this procedure.   Follow-Up: At [facility], you and your health needs are our priority.  As part of our continuing mission to provide you with exceptional heart care, we have created designated Provider Care Teams.  These Care Teams include your primary Cardiologist (physician) and Advanced Practice Providers (APPs -  Physician Assistants and Nurse Practitioners) who all work together to provide you with the care you need, when you need it.  We recommend signing up for the patient portal called "MyChart".  Sign up information is provided on this After Visit Summary.  MyChart is used to connect with patients for Virtual Visits (Telemedicine).  Patients are able to view lab/test results, encounter notes, upcoming appointments, etc.  Non-urgent messages can be sent to your provider as well.   To learn more about what you can do with MyChart, go to [email or web address].    Your next appointment:    Pending  Echo Results   The format for your next appointment:   In Person  Provider:   Dr. Harrington Challenger    Other Instructions Thank you for choosing [facility]!

## 2020-06-20 NOTE — Progress Notes (Signed)
Cardiology Office Note   Date:  06/20/2020   ID:  Gina Nelson, DOB 06-May-1950, MRN [id number]  PCP:  [name], MD  Cardiologist:   Dorris Carnes, MD   Patient referred for evaluation of CP      History of Present Illness: Gina Nelson is a 70 y.o. female with a history of CP  The pt was seen in [facility] ED on 5/26   Had L sided sharp / stabbing CP  Occurred at rest    Lasted several seconds  The pt was seen at [facility] ED   Trop negative  EKG done  CXR done that showed mild cardiomegaly and prominent pulmonary vasculature.  Bedside USN done  No effusion  Since then she has had no further spells of pain  The pt says her breathing is OK  No dizziness  No palpitations  No orthopnea/PND   No edema She is not too active, does not walk regularly    Note the pt had breast cancer in the past   She is s/p bilateral mastectomy   Had chemotherapy with doxurubicin         Current Meds  Medication Sig  . acetaminophen (TYLENOL) 500 MG tablet Take 1,000 mg by mouth 2 (two) times daily as needed for headache.   Marland Kitchen COVID-19 mRNA vaccine, Pfizer, 30 MCG/0.3ML injection Inject into the muscle.  . EQ LORATADINE 10 MG tablet Take 1 tablet by mouth once daily  . ergocalciferol (VITAMIN D2) 1.25 MG (50000 UT) capsule Take 1 capsule (50,000 Units total) by mouth once a week. One capsule once weekly  . meclizine (ANTIVERT) 12.5 MG tablet Take 1 tablet (12.5 mg total) by mouth 3 (three) times daily as needed for dizziness.  . naproxen (NAPROSYN) 500 MG tablet Take 1 tablet (500 mg total) by mouth 2 (two) times daily with a meal.  . tamoxifen (NOLVADEX) 20 MG tablet Take 1 tablet (20 mg total) by mouth at bedtime.     Allergies:   Codeine   Past Medical History:  Diagnosis Date  . Allergy   . Arthritis    knee  . Blood transfusion   . Cancer [facility])    right breast T1cN2  . PONV (postoperative nausea and vomiting)     Past Surgical History:  Procedure Laterality Date  . ABDOMINAL  HYSTERECTOMY  1984  . BIOPSY  12/12/2016   Procedure: BIOPSY;  Surgeon: Rogene Houston, MD;  Location[facility];  Service: Endoscopy;;  gastric  . BREAST SURGERY  right MRM, left simple mastectomy   2012  . BREAST SURGERY  left mastectomy   2012  . COLONOSCOPY  05/07/2011   Procedure: COLONOSCOPY;  Surgeon: Rogene Houston, MD;  Location[facility];  Service: Endoscopy;  Laterality: N/A;  830  . ESOPHAGOGASTRODUODENOSCOPY N/A 12/12/2016   Procedure: ESOPHAGOGASTRODUODENOSCOPY (EGD);  Surgeon: Rogene Houston, MD;  Location: [facility];  Service: Endoscopy;  Laterality: N/A;  9:15  . JOINT REPLACEMENT  11/2009   right knee  . PORT-A-CATH REMOVAL  07/16/10  . TOTAL KNEE ARTHROPLASTY  2011     Social History:  The patient  reports that she has never smoked. She has never used smokeless tobacco. She reports that she does not drink alcohol and does not use drugs.   Family History:  The patient's family history includes Hypertension in her daughter; Pancreatic cancer in her sister; Pneumonia in her mother; Prostate cancer in her father.  ROS:  Please see the history of present illness. All other systems are reviewed and  Negative to the above problem except as noted.    PHYSICAL EXAM: VS:  BP 120/60 (BP Location: Left Arm, Patient Position: Sitting, Cuff Size: Normal)   Pulse 68   Ht 5\' 5"  (1.651 m)   Wt 167 lb (75.8 kg)   SpO2 100%   BMI 27.79 kg/m   GEN: Well nourished, well developed, in no acute distress  HEENT: normal  Neck: no JVD, carotid bruits, Cardiac: RRR; no murmurs,,no LE edema  Chest  S/p bilateral mastectomy  Respiratory:  clear to auscultation bilaterally, normal work of breathing GI: soft, nontender, nondistended, + BS  No hepatomegaly  MS: no deformity Moving all extremities   Skin: warm and dry, no rash Neuro:  Strength and sensation are intact Psych: euthymic mood, full affect   EKG:  EKG is not ordered today.  On 06/07/20  SR 73  Incomp  RBBB   Lipid Panel    Component Value Date/Time   CHOL 180 01/19/2020 0909   TRIG 83 01/19/2020 0909   HDL 67 01/19/2020 0909   CHOLHDL 2.7 01/19/2020 0909   CHOLHDL 2.5 12/27/2018 0833   VLDL 16 04/17/2016 0929   LDLCALC 98 01/19/2020 0909   LDLCALC 77 12/27/2018 0833      Wt Readings from Last 3 Encounters:  06/20/20 167 lb (75.8 kg)  06/07/20 162 lb (73.5 kg)  01/27/20 161 lb 6.4 oz (73.2 kg)      ASSESSMENT AND PLAN:  1  Chest pain   Patient with one night of chest pains   Very atypical for cardiac  Pain     2  Cardiomegaly    CXR with enlarged silhouette   Different from previous   Given Hx of chemo would reocmm an echo to evaluate LV and RV size and function.    Follow up based on test results.     Current medicines are reviewed at length with the patient today.  The patient does not have concerns regarding medicines.  Signed, Dorris Carnes, MD  06/20/2020 2:43 PM    [address], [place], [place]  [address] Phone: [phone]; Fax: [phone]

## 2020-07-05 ENCOUNTER — Ambulatory Visit (HOSPITAL_COMMUNITY)
Admission: RE | Admit: 2020-07-05 | Discharge: 2020-07-05 | Disposition: A | Discharge status: Home / Self Care | Payer: Medicare PPO | Source: Ambulatory Visit | Attending: Internal Medicine | Admitting: Internal Medicine

## 2020-07-05 ENCOUNTER — Other Ambulatory Visit: Payer: Self-pay

## 2020-07-05 DIAGNOSIS — R9389 Abnormal findings on diagnostic imaging of other specified body structures: Secondary | ICD-10-CM | POA: Diagnosis not present

## 2020-07-05 DIAGNOSIS — I517 Cardiomegaly: Secondary | ICD-10-CM | POA: Insufficient documentation

## 2020-07-05 LAB — ECHOCARDIOGRAM COMPLETE
Area-P 1/2: 4.06 cm2
S' Lateral: 3.1 cm

## 2020-07-05 NOTE — Progress Notes (Signed)
*  PRELIMINARY RESULTS* Echocardiogram 2D Echocardiogram has been performed.  Gina Nelson 07/05/2020, 9:29 AM

## 2020-07-26 ENCOUNTER — Other Ambulatory Visit: Payer: Self-pay

## 2020-07-26 ENCOUNTER — Ambulatory Visit: Payer: Medicare PPO | Admitting: Family Medicine

## 2020-07-26 ENCOUNTER — Encounter: Payer: Self-pay | Admitting: Family Medicine

## 2020-07-26 VITALS — BP 121/65 | HR 70 | Temp 98.8°F | Resp 18 | Ht 65.0 in | Wt 170.0 lb

## 2020-07-26 DIAGNOSIS — Z6379 Other stressful life events affecting family and household: Secondary | ICD-10-CM | POA: Diagnosis not present

## 2020-07-26 DIAGNOSIS — C50919 Malignant neoplasm of unspecified site of unspecified female breast: Secondary | ICD-10-CM

## 2020-07-26 DIAGNOSIS — E559 Vitamin D deficiency, unspecified: Secondary | ICD-10-CM

## 2020-07-26 DIAGNOSIS — J302 Other seasonal allergic rhinitis: Secondary | ICD-10-CM | POA: Diagnosis not present

## 2020-07-26 NOTE — Patient Instructions (Signed)
Annual exam with MD in January, call if you need me before  Please call for flu vaccine in September , and come in for this  Please get your 2 shingrix  vaccines, they are past due  Keep up great health habits   It is important that you exercise regularly at least 30 minutes 5 times a week. If you develop chest pain, have severe difficulty breathing, or feel very tired, stop exercising immediately and seek medical attention    Think about what you will eat, plan ahead. Choose " clean, green, fresh or frozen" over canned, processed or packaged foods which are more sugary, salty and fatty. 70 to 75% of food eaten should be vegetables and fruit. Three meals at set times with snacks allowed between meals, but they must be fruit or vegetables. Aim to eat over a 12 hour period , example 7 am to 7 pm, and STOP after  your last meal of the day. Drink water,generally about 64 ounces per day, no other drink is as healthy. Fruit juice is best enjoyed in a healthy way, by EATING the fruit.   Thanks for choosing [facility], we consider it a privelige to serve you.

## 2020-07-27 DIAGNOSIS — Z6379 Other stressful life events affecting family and household: Secondary | ICD-10-CM | POA: Insufficient documentation

## 2020-07-27 NOTE — Assessment & Plan Note (Signed)
Improved, states she has got her sister's children involved her care and this is necessary and has improved the situation overall

## 2020-07-27 NOTE — Progress Notes (Signed)
   Gina Nelson     MRN: [id number]      DOB: 1950/09/20   HPI Gina Nelson is here for follow up and re-evaluation of chronic medical conditions, medication management and review of any available recent lab and radiology data.  Preventive health is updated, specifically  Cancer screening and Immunization.   Was in the E with chest pain in May, 2022, negative cardiac workup, and no recurrence. Attributes her symtom to the severe stress she was under at the time due to her sister's illness Feels well, no complaints or concerns at this visit, holds on firmly to a positive attitude to life   ROS Denies recent fever or chills. Denies sinus pressure, nasal congestion, ear pain or sore throat. Denies chest congestion, productive cough or wheezing. Denies chest pains, palpitations and leg swelling Denies abdominal pain, nausea, vomiting,diarrhea or constipation.   Denies dysuria, frequency, hesitancy or incontinence. Denies joint pain, swelling and limitation in mobility. Denies headaches, seizures, numbness, or tingling. Denies depression, anxiety or insomnia. Denies skin break down or rash.   PE  BP 121/65 (BP Location: Left Arm, Patient Position: Sitting, Cuff Size: Large)   Pulse 70   Temp 98.8 F (37.1 C)   Resp 18   Ht 5\' 5"  (1.651 m)   Wt 170 lb (77.1 kg)   SpO2 98%   BMI 28.29 kg/m   Patient alert and oriented and in no cardiopulmonary distress.  HEENT: No facial asymmetry, EOMI,     Neck supple .  Chest: Clear to auscultation bilaterally.  CVS: S1, S2 no murmurs, no S3.Regular rate.  ABD: Soft non tender.   Ext: No edema  MS: Adequate ROM spine, shoulders, hips and knees.  Skin: Intact, no ulcerations or rash noted.  Psych: Good eye contact, normal affect. Memory intact not anxious or depressed appearing.  CNS: CN 2-12 intact, power,  normal throughout.no focal deficits noted.   Assessment & Plan  Seasonal allergies Controlled currently , no  flares  Breast cancer-right T2N1. Year 10 post diagnosis, has upcoming  Oncology appt  Vitamin D deficiency Updated lab needed at/ before next visit.   Stress due to illness of family member Improved, states she has got her sister's children involved her care and this is necessary and has improved the situation overall

## 2020-07-27 NOTE — Assessment & Plan Note (Signed)
Updated lab needed at/ before next visit.   

## 2020-07-27 NOTE — Assessment & Plan Note (Signed)
Controlled currently , no flares

## 2020-07-27 NOTE — Assessment & Plan Note (Signed)
Year 10 post diagnosis, has upcoming  Oncology appt

## 2020-08-01 ENCOUNTER — Ambulatory Visit (INDEPENDENT_AMBULATORY_CARE_PROVIDER_SITE_OTHER): Payer: Medicare PPO | Admitting: *Deleted

## 2020-08-01 ENCOUNTER — Other Ambulatory Visit: Payer: Self-pay

## 2020-08-01 DIAGNOSIS — Z0000 Encounter for general adult medical examination without abnormal findings: Secondary | ICD-10-CM | POA: Diagnosis not present

## 2020-08-01 NOTE — Patient Instructions (Signed)
Gina Nelson , Thank you for taking time to come for your Medicare Wellness Visit. I appreciate your ongoing commitment to your health goals. Please review the following plan we discussed and let me know if I can assist you in the future.   Screening recommendations/referrals: Colonoscopy: Completed Due 05-06-21 Bone Density: Completed Due 07-14-24 Recommended yearly ophthalmology/optometry visit for glaucoma screening and checkup Recommended yearly dental visit for hygiene and checkup  Vaccinations: Influenza vaccine: Completed Due 08-13-20 Pneumococcal vaccine: Completed Tdap vaccine: Due Now  Shingles vaccine: Due Now    Advanced directives: Patient declined information   Next appointment: 1 year    Preventive Care 70 Years and Older, Female Preventive care refers to lifestyle choices and visits with your health care provider that can promote health and wellness. What does preventive care include? A yearly physical exam. This is also called an annual well check. Dental exams once or twice a year. Routine eye exams. Ask your health care provider how often you should have your eyes checked. Personal lifestyle choices, including: Daily care of your teeth and gums. Regular physical activity. Eating a healthy diet. Avoiding tobacco and drug use. Limiting alcohol use. Practicing safe sex. Taking low-dose aspirin every day. Taking vitamin and mineral supplements as recommended by your health care provider. What happens during an annual well check? The services and screenings done by your health care provider during your annual well check will depend on your age, overall health, lifestyle risk factors, and family history of disease. Counseling  Your health care provider may ask you questions about your: Alcohol use. Tobacco use. Drug use. Emotional well-being. Home and relationship well-being. Sexual activity. Eating habits. History of falls. Memory and ability to understand  (cognition). Work and work Statistician. Reproductive health. Screening  You may have the following tests or measurements: Height, weight, and BMI. Blood pressure. Lipid and cholesterol levels. These may be checked every 5 years, or more frequently if you are over 16 years old. Skin check. Lung cancer screening. You may have this screening every year starting at age 70 if you have a 30-pack-year history of smoking and currently smoke or have quit within the past 15 years. Fecal occult blood test (FOBT) of the stool. You may have this test every year starting at age 70. Flexible sigmoidoscopy or colonoscopy. You may have a sigmoidoscopy every 5 years or a colonoscopy every 10 years starting at age 70. Hepatitis C blood test. Hepatitis B blood test. Sexually transmitted disease (STD) testing. Diabetes screening. This is done by checking your blood sugar (glucose) after you have not eaten for a while (fasting). You may have this done every 1-3 years. Bone density scan. This is done to screen for osteoporosis. You may have this done starting at age 19. Mammogram. This may be done every 1-2 years. Talk to your health care provider about how often you should have regular mammograms. Talk with your health care provider about your test results, treatment options, and if necessary, the need for more tests. Vaccines  Your health care provider may recommend certain vaccines, such as: Influenza vaccine. This is recommended every year. Tetanus, diphtheria, and acellular pertussis (Tdap, Td) vaccine. You may need a Td booster every 10 years. Zoster vaccine. You may need this after age 72. Pneumococcal 13-valent conjugate (PCV13) vaccine. One dose is recommended after age 44. Pneumococcal polysaccharide (PPSV23) vaccine. One dose is recommended after age 47. Talk to your health care provider about which screenings and vaccines you need and how  often you need them. This information is not intended to  replace advice given to you by your health care provider. Make sure you discuss any questions you have with your health care provider. Document Released: 01/26/2015 Document Revised: 09/19/2015 Document Reviewed: 10/31/2014 Elsevier Interactive Patient Education  2017 [employer] Prevention in the Home Falls can cause injuries. They can happen to people of all ages. There are many things you can do to make your home safe and to help prevent falls. What can I do on the outside of my home? Regularly fix the edges of walkways and driveways and fix any cracks. Remove anything that might make you trip as you walk through a door, such as a raised step or threshold. Trim any bushes or trees on the path to your home. Use bright outdoor lighting. Clear any walking paths of anything that might make someone trip, such as rocks or tools. Regularly check to see if handrails are loose or broken. Make sure that both sides of any steps have handrails. Any raised decks and porches should have guardrails on the edges. Have any leaves, snow, or ice cleared regularly. Use sand or salt on walking paths during winter. Clean up any spills in your garage right away. This includes oil or grease spills. What can I do in the bathroom? Use night lights. Install grab bars by the toilet and in the tub and shower. Do not use towel bars as grab bars. Use non-skid mats or decals in the tub or shower. If you need to sit down in the shower, use a plastic, non-slip stool. Keep the floor dry. Clean up any water that spills on the floor as soon as it happens. Remove soap buildup in the tub or shower regularly. Attach bath mats securely with double-sided non-slip rug tape. Do not have throw rugs and other things on the floor that can make you trip. What can I do in the bedroom? Use night lights. Make sure that you have a light by your bed that is easy to reach. Do not use any sheets or blankets that are too big for  your bed. They should not hang down onto the floor. Have a firm chair that has side arms. You can use this for support while you get dressed. Do not have throw rugs and other things on the floor that can make you trip. What can I do in the kitchen? Clean up any spills right away. Avoid walking on wet floors. Keep items that you use a lot in easy-to-reach places. If you need to reach something above you, use a strong step stool that has a grab bar. Keep electrical cords out of the way. Do not use floor polish or wax that makes floors slippery. If you must use wax, use non-skid floor wax. Do not have throw rugs and other things on the floor that can make you trip. What can I do with my stairs? Do not leave any items on the stairs. Make sure that there are handrails on both sides of the stairs and use them. Fix handrails that are broken or loose. Make sure that handrails are as long as the stairways. Check any carpeting to make sure that it is firmly attached to the stairs. Fix any carpet that is loose or worn. Avoid having throw rugs at the top or bottom of the stairs. If you do have throw rugs, attach them to the floor with carpet tape. Make sure that you have a  light switch at the top of the stairs and the bottom of the stairs. If you do not have them, ask someone to add them for you. What else can I do to help prevent falls? Wear shoes that: Do not have high heels. Have rubber bottoms. Are comfortable and fit you well. Are closed at the toe. Do not wear sandals. If you use a stepladder: Make sure that it is fully opened. Do not climb a closed stepladder. Make sure that both sides of the stepladder are locked into place. Ask someone to hold it for you, if possible. Clearly mark and make sure that you can see: Any grab bars or handrails. First and last steps. Where the edge of each step is. Use tools that help you move around (mobility aids) if they are needed. These  include: Canes. Walkers. Scooters. Crutches. Turn on the lights when you go into a dark area. Replace any light bulbs as soon as they burn out. Set up your furniture so you have a clear path. Avoid moving your furniture around. If any of your floors are uneven, fix them. If there are any pets around you, be aware of where they are. Review your medicines with your doctor. Some medicines can make you feel dizzy. This can increase your chance of falling. Ask your doctor what other things that you can do to help prevent falls. This information is not intended to replace advice given to you by your health care provider. Make sure you discuss any questions you have with your health care provider. Document Released: 10/26/2008 Document Revised: 06/07/2015 Document Reviewed: 02/03/2014 Elsevier Interactive Patient Education  2017 [employer].

## 2020-08-01 NOTE — Progress Notes (Signed)
Subjective:   Gina Nelson is a 70 y.o. female who presents for Medicare Annual (Subsequent) preventive examination.  Review of Systems           Objective:    There were no vitals filed for this visit. There is no height or weight on file to calculate BMI.  Advanced Directives 06/07/2020 08/01/2019 05/08/2017 12/12/2016 04/24/2016 12/01/2015 10/10/2015  Does Patient Have a Medical Advance Directive? No No No No No No Yes  Does patient want to make changes to medical advance directive? - - No - Patient declined - - - -  Would patient like information on creating a medical advance directive? - Yes (ED - Information included in AVS) No - Patient declined No - Patient declined No - Patient declined No - patient declined information -  Pre-existing out of facility DNR order (yellow form or pink MOST form) - - - - - - -    Current Medications (verified) Outpatient Encounter Medications as of 08/01/2020  Medication Sig   acetaminophen (TYLENOL) 500 MG tablet Take 1,000 mg by mouth 2 (two) times daily as needed for headache.    COVID-19 mRNA vaccine, Pfizer, 30 MCG/0.3ML injection Inject into the muscle.   EQ LORATADINE 10 MG tablet Take 1 tablet by mouth once daily   ergocalciferol (VITAMIN D2) 1.25 MG (50000 UT) capsule Take 1 capsule (50,000 Units total) by mouth once a week. One capsule once weekly   meclizine (ANTIVERT) 12.5 MG tablet Take 1 tablet (12.5 mg total) by mouth 3 (three) times daily as needed for dizziness.   naproxen (NAPROSYN) 500 MG tablet Take 1 tablet (500 mg total) by mouth 2 (two) times daily with a meal.   tamoxifen (NOLVADEX) 20 MG tablet Take 1 tablet (20 mg total) by mouth at bedtime.   No facility-administered encounter medications on file as of 08/01/2020.    Allergies (verified) Codeine   History: Past Medical History:  Diagnosis Date   Allergy    Arthritis    knee   Blood transfusion    Cancer ([place])    right breast T1cN2   PONV (postoperative  nausea and vomiting)    Past Surgical History:  Procedure Laterality Date   ABDOMINAL HYSTERECTOMY  1984   BIOPSY  12/12/2016   Procedure: BIOPSY;  Surgeon: Rogene Houston, MD;  Location[facility];  Service: Endoscopy;;  gastric   BREAST SURGERY  right MRM, left simple mastectomy   2012   BREAST SURGERY  left mastectomy   2012   COLONOSCOPY  05/07/2011   Procedure: COLONOSCOPY;  Surgeon: Rogene Houston, MD;  Location: [facility];  Service: Endoscopy;  Laterality: N/A;  830   ESOPHAGOGASTRODUODENOSCOPY N/A 12/12/2016   Procedure: ESOPHAGOGASTRODUODENOSCOPY (EGD);  Surgeon: Rogene Houston, MD;  Location: [facility];  Service: Endoscopy;  Laterality: N/A;  9:15   JOINT REPLACEMENT  11/2009   right knee   PORT-A-CATH REMOVAL  07/16/10   TOTAL KNEE ARTHROPLASTY  2011   Family History  Problem Relation Age of Onset   Prostate cancer Father    Pancreatic cancer Sister    Pneumonia Mother    Hypertension Daughter    Social History   Socioeconomic History   Marital status: Married    Spouse name: Izell Westcliffe   Number of children: 1   Years of education: Not on file   Highest education level: 12th grade  Occupational History   Occupation: retired  Tobacco Use   Smoking status:  Never   Smokeless tobacco: Never  Vaping Use   Vaping Use: Never used  Substance and Sexual Activity   Alcohol use: No   Drug use: No   Sexual activity: Yes    Birth control/protection: Surgical  Other Topics Concern   Not on file  Social History Narrative   Has been married 81 years. Husband is a Company secretary. She is retired.Visits sick people in the hospital. Enjoys word search puzzles.   Social Determinants of Health   Financial Resource Strain: Not on file  Food Insecurity: Not on file  Transportation Needs: Not on file  Physical Activity: Not on file  Stress: Not on file  Social Connections: Not on file    Tobacco Counseling Counseling given: Not Answered   Clinical Intake:                  Diabetic?Prediabetes         Activities of Daily Living No flowsheet data found.  Patient Care Team: [name], MD as PCP - Domingo Sep, MD as Consulting Physician (General Surgery) [name], MD (Inactive) as Consulting Physician (Obstetrics and Gynecology) Magrinat, Virgie Dad, MD as Consulting Physician (Oncology) Madelin Headings, DO as Consulting Physician (Optometry) [name], Mechele Dawley, MD as Consulting Physician (Gastroenterology)  Indicate any recent Medical Services you may have received from other than [facility] providers in the past year (date may be approximate).     Assessment:   This is a routine wellness examination for Gina Nelson.  Hearing/Vision screen No results found.  Dietary issues and exercise activities discussed:     Goals Addressed   None   Depression Screen PHQ 2/9 Scores 07/26/2020 01/27/2020 08/01/2019 07/04/2019 06/27/2019 01/03/2019 07/20/2018  PHQ - 2 Score 0 0 0 0 0 0 2  PHQ- 9 Score - - - - - - 2    Fall Risk Fall Risk  07/26/2020 01/27/2020 11/22/2019 08/01/2019 07/04/2019  Falls in the past year? 0 0 1 0 1  Number falls in past yr: 0 0 0 0 0  Injury with Fall? 0 0 0 0 0  Risk Factor Category  - - - - -  Risk for fall due to : No Fall Risks No Fall Risks - - -  Follow up Falls evaluation completed Falls evaluation completed - - -  Comment - - - - -    FALL RISK PREVENTION PERTAINING TO THE HOME:  Any stairs in or around the home? No  If so, are there any without handrails? No  Home free of loose throw rugs in walkways, pet beds, electrical cords, etc? Yes  Adequate lighting in your home to reduce risk of falls? Yes   ASSISTIVE DEVICES UTILIZED TO PREVENT FALLS:  Life alert? No  Use of a cane, walker or w/c? No  Grab bars in the bathroom? No  Shower chair or bench in shower? Yes  Elevated toilet seat or a handicapped toilet? Yes   TIMED UP AND GO:  Was the test performed? No .  Length of time  to ambulate 10 feet: NA sec.     Cognitive Function:     6CIT Screen 08/01/2019 07/20/2018 05/08/2017 04/24/2016  What Year? 0 points 0 points 0 points 0 points  What month? 0 points 0 points 0 points 0 points  What time? 0 points 0 points 0 points 0 points  Count back from 20 0 points 0 points 0 points 0 points  Months in reverse 0 points 0  points 0 points 0 points  Repeat phrase 0 points 0 points 0 points 0 points  Total Score 0 0 0 0    Immunizations Immunization History  Administered Date(s) Administered   Fluad Quad(high Dose 65+) 09/14/2018, 09/30/2019   Influenza Whole 11/08/2006   Influenza,inj,Quad PF,6+ Mos 09/22/2012, 09/20/2013, 09/12/2014, 11/05/2015, 10/07/2016, 10/01/2017   PFIZER Comirnaty(Gray Top)Covid-19 Tri-Sucrose Vaccine 04/30/2020   PFIZER(Purple Top)SARS-COV-2 Vaccination 02/19/2019, 03/12/2019, 10/20/2019   Pneumococcal Conjugate-13 04/24/2015   Pneumococcal Polysaccharide-23 04/24/2016   Td 08/06/2005   Zoster, Live 02/23/2012    TDAP status: Due, Education has been provided regarding the importance of this vaccine. Advised may receive this vaccine at local pharmacy or Health Dept. Aware to provide a copy of the vaccination record if obtained from local pharmacy or Health Dept. Verbalized acceptance and understanding.  Flu Vaccine status: Up to date  Pneumococcal vaccine status: Up to date  Covid-19 vaccine status: Completed vaccines  Qualifies for Shingles Vaccine? Yes   Zostavax completed No   Shingrix Completed?: No.    Education has been provided regarding the importance of this vaccine. Patient has been advised to call insurance company to determine out of pocket expense if they have not yet received this vaccine. Advised may also receive vaccine at local pharmacy or Health Dept. Verbalized acceptance and understanding.  Screening Tests Health Maintenance  Topic Date Due   Zoster Vaccines- Shingrix (1 of 2) Never done   TETANUS/TDAP  08/07/2015    MAMMOGRAM  05/30/2021 (Originally 07/24/2012)   INFLUENZA VACCINE  08/13/2020   COVID-19 Vaccine (5 - Booster for Pfizer series) 08/30/2020   COLONOSCOPY (Pts 45-29yrs Insurance coverage will need to be confirmed)  05/06/2021   DEXA SCAN  Completed   Hepatitis C Screening  Completed   PNA vac Low Risk Adult  Completed   HPV VACCINES  Aged Out    Health Maintenance  Health Maintenance Due  Topic Date Due   Zoster Vaccines- Shingrix (1 of 2) Never done   TETANUS/TDAP  08/07/2015    Colorectal cancer screening: Type of screening: Colonoscopy. Completed 05-07-11. Repeat every 10 years  Mammogram status: No longer required due to having breast removed.  Bone Density status: Completed 07-15-19. Results reflect: Bone density results: NORMAL. Repeat every 5 years.  Lung Cancer Screening: (Low Dose CT Chest recommended if Age 63-80 years, 30 pack-year currently smoking OR have quit w/in 15years.) does not qualify.   Lung Cancer Screening Referral: NA  Additional Screening:  Hepatitis C Screening: does qualify; Completed 12-15-14  Vision Screening: Recommended annual ophthalmology exams for early detection of glaucoma and other disorders of the eye. Is the patient up to date with their annual eye exam?  Yes  Who is the provider or what is the name of the office in which the patient attends annual eye exams? Dr Jorja Loa [facility] If pt is not established with a provider, would they like to be referred to a provider to establish care? No .   Dental Screening: Recommended annual dental exams for proper oral hygiene  Community Resource Referral / Chronic Care Management: CRR required this visit?  No   CCM required this visit?  No      Plan:     I have personally reviewed and noted the following in the patient's chart:   Medical and social history Use of alcohol, tobacco or illicit drugs  Current medications and supplements including opioid prescriptions.  Functional  ability and status Nutritional status Physical activity Advanced  directives List of other physicians Hospitalizations, surgeries, and ER visits in previous 12 months Vitals Screenings to include cognitive, depression, and falls Referrals and appointments  In addition, I have reviewed and discussed with patient certain preventive protocols, quality metrics, and best practice recommendations. A written personalized care plan for preventive services as well as general preventive health recommendations were provided to patient.     Shelda Altes, CMA   08/01/2020   Nurse Notes:

## 2020-08-22 NOTE — Progress Notes (Signed)
I connected with  [name] on 08/22/20 by an audio enabled telemedicine application and verified that I am speaking with the correct person using two identifiers.   I discussed the limitations, risks, security and privacy concerns of performing an evaluation and management service by telephone and the availability of in person appointments. I also discussed with the patient that there may be a patient responsible charge related to this service. The patient expressed understanding and verbally consented to this telephonic visit.  Pt was at home. Provider was Dr [name] and in the office. This was a telehealth visit.

## 2020-09-20 ENCOUNTER — Other Ambulatory Visit: Payer: Self-pay

## 2020-09-20 ENCOUNTER — Ambulatory Visit (INDEPENDENT_AMBULATORY_CARE_PROVIDER_SITE_OTHER): Payer: Medicare PPO

## 2020-09-20 DIAGNOSIS — Z23 Encounter for immunization: Secondary | ICD-10-CM | POA: Diagnosis not present

## 2020-11-06 ENCOUNTER — Ambulatory Visit: Payer: Medicare PPO

## 2020-11-08 ENCOUNTER — Other Ambulatory Visit: Payer: Self-pay

## 2020-11-08 ENCOUNTER — Ambulatory Visit: Payer: Medicare PPO | Attending: Internal Medicine

## 2020-11-08 DIAGNOSIS — Z23 Encounter for immunization: Secondary | ICD-10-CM

## 2020-11-08 MED ORDER — PFIZER COVID-19 VAC BIVALENT 30 MCG/0.3ML IM SUSP
INTRAMUSCULAR | 0 refills | Status: DC
  Filled 2020-11-08: qty 0.3, 1d supply, fill #0

## 2020-11-08 NOTE — Progress Notes (Signed)
   Covid-19 Vaccination Clinic  Name:  Gina Nelson    MRN: [id number] DOB: 1950/09/29  11/08/2020  [name] was observed post Covid-19 immunization for 15 minutes without incident. She was provided with Vaccine Information Sheet and instruction to access the V-Safe system.   [name] was instructed to call 911 with any severe reactions post vaccine: Difficulty breathing  Swelling of face and throat  A fast heartbeat  A bad rash all over body  Dizziness and weakness   Immunizations Administered     Name Date Dose VIS Date Route   Pfizer Covid-19 Vaccine Bivalent Booster 11/08/2020 11:07 AM 0.3 mL 09/12/2020 Intramuscular   Manufacturer: [place]   Lot: [id number]   [place]: [phone]

## 2020-11-12 ENCOUNTER — Ambulatory Visit: Payer: Medicare PPO

## 2020-11-13 ENCOUNTER — Other Ambulatory Visit: Payer: Medicare PPO

## 2020-11-13 ENCOUNTER — Ambulatory Visit: Payer: Medicare PPO | Admitting: Oncology

## 2020-11-15 ENCOUNTER — Other Ambulatory Visit: Payer: Self-pay

## 2020-11-15 ENCOUNTER — Ambulatory Visit: Payer: Medicare PPO | Admitting: Family Medicine

## 2020-11-15 ENCOUNTER — Encounter: Payer: Self-pay | Admitting: Family Medicine

## 2020-11-15 VITALS — BP 130/69 | HR 65 | Resp 16 | Ht 65.0 in | Wt 160.0 lb

## 2020-11-15 DIAGNOSIS — F4321 Adjustment disorder with depressed mood: Secondary | ICD-10-CM

## 2020-11-15 DIAGNOSIS — R7303 Prediabetes: Secondary | ICD-10-CM

## 2020-11-15 DIAGNOSIS — N898 Other specified noninflammatory disorders of vagina: Secondary | ICD-10-CM

## 2020-11-15 DIAGNOSIS — B369 Superficial mycosis, unspecified: Secondary | ICD-10-CM

## 2020-11-15 DIAGNOSIS — E559 Vitamin D deficiency, unspecified: Secondary | ICD-10-CM | POA: Diagnosis not present

## 2020-11-15 DIAGNOSIS — Z13220 Encounter for screening for lipoid disorders: Secondary | ICD-10-CM | POA: Diagnosis not present

## 2020-11-15 MED ORDER — BETAMETHASONE DIPROPIONATE 0.05 % EX CREA: TOPICAL_CREAM | CUTANEOUS | 0 refills | Status: DC

## 2020-11-15 MED ORDER — CLOTRIMAZOLE-BETAMETHASONE 1-0.05 % EX CREA: 1.0000 "application " | TOPICAL_CREAM | Freq: Two times a day (BID) | CUTANEOUS | 0 refills | Status: DC

## 2020-11-15 NOTE — Patient Instructions (Addendum)
Annual exam in early Feb , call if you need me sooner  My condolence re your recent loss  Cream prescribed for behind ear, and also anther for vaginal; symptoms  Fasting cBC, lipid, cmp and EGFr, tSH , vit D 5 days before next appointment  Thanks for choosing [facility], we consider it a privelige to serve you.

## 2020-11-16 ENCOUNTER — Telehealth: Payer: Self-pay | Admitting: Family Medicine

## 2020-11-16 NOTE — Telephone Encounter (Signed)
Please advise as it does not say on the prescriptions

## 2020-11-16 NOTE — Telephone Encounter (Signed)
Please call the pt to advise what cream goes where

## 2020-11-16 NOTE — Telephone Encounter (Signed)
See duplicate message- response given

## 2020-11-16 NOTE — Telephone Encounter (Signed)
Pt aware.

## 2020-11-16 NOTE — Telephone Encounter (Signed)
Pt called in about new creams prescribed yesterday *Betamethasome  / clotrimazole  Pt is confused on which cream should be used where

## 2020-11-18 ENCOUNTER — Encounter: Payer: Self-pay | Admitting: Family Medicine

## 2020-11-18 DIAGNOSIS — B369 Superficial mycosis, unspecified: Secondary | ICD-10-CM | POA: Insufficient documentation

## 2020-11-18 DIAGNOSIS — N898 Other specified noninflammatory disorders of vagina: Secondary | ICD-10-CM | POA: Insufficient documentation

## 2020-11-18 NOTE — Assessment & Plan Note (Signed)
Recent unexpected loss of very close friend, appropriately grieving, vented and cried for 10 mins, no interest in therapy currently

## 2020-11-18 NOTE — Assessment & Plan Note (Signed)
Affecting skin behind right ear, clotrimaz/betameth twice daily

## 2020-11-18 NOTE — Assessment & Plan Note (Signed)
Topical betamethasone twice weekly as needed, gyne eval if worsens

## 2020-11-18 NOTE — Progress Notes (Signed)
   Gina Nelson     MRN: [id number]      DOB: October 22, 1950   HPI Ms. Pain is here c/o vaginal tenderness esp with intercourse, no bleeding, discharge or odor C/o flaking and itching behind ears, right greater than left C/o acute grief reaction following recent unexpected passing of a very dear friend Not suicidal or homicidal, grieving and in shock, reports good family support  ROS See HPI  Denies recent fever or chills. Denies sinus pressure, nasal congestion, ear pain or sore throat. Denies chest congestion, productive cough or wheezing. Denies chest pains, palpitations and leg swelling Denies abdominal pain, nausea, vomiting,diarrhea or constipation.   Denies dysuria, frequency, hesitancy or incontinence. Denies joint pain, swelling and limitation in mobility. Denies headaches, seizures, numbness, or tingling.   PE  BP 130/69   Pulse 65   Resp 16   Ht 5\' 5"  (1.651 m)   Wt 160 lb (72.6 kg)   SpO2 98%   BMI 26.63 kg/m   Patient alert and oriented and in no cardiopulmonary distress.  HEENT: No facial asymmetry, EOMI,     Neck supple .  Chest: Clear to auscultation bilaterally.  CVS: S1, S2 no murmurs, no S3.Regular rate.  ABD: Soft non tender.   Ext: No edema  MS: Adequate ROM spine, shoulders, hips and knees. Skin, eczema and fungal rh behind right ear  Psych: Good eye contact, normal affect. Memory intact not crying in  grief  CNS: CN 2-12 intact, power,  normal throughout.no focal deficits noted.   Assessment & Plan  Dermatomycosis Affecting skin behind right ear, clotrimaz/betameth twice daily  Vaginal itching Topical betamethasone twice weekly as needed, gyne eval if worsens  Grief reaction Recent unexpected loss of very close friend, appropriately grieving, vented and cried for 10 mins, no interest in therapy currently

## 2021-01-01 ENCOUNTER — Other Ambulatory Visit: Payer: Self-pay

## 2021-01-01 DIAGNOSIS — C50811 Malignant neoplasm of overlapping sites of right female breast: Secondary | ICD-10-CM

## 2021-01-01 NOTE — Progress Notes (Signed)
ID: Gina Nelson   DOB: 09-27-1950  MR#: [id number]  YHC#:[id number]  Patient Care Team: [name], MD as PCP - General [name], MD as Consulting Physician (General Surgery) [name], MD (Inactive) as Consulting Physician (Obstetrics and Gynecology) Michella Detjen, Gina Dad, MD as Consulting Physician (Oncology) Madelin Headings, DO as Consulting Physician (Optometry) [name], Mechele Dawley, MD as Consulting Physician (Gastroenterology)  CHIEF COMPLAINT: right breast cancer (s/p bilateral mastectomies)  CURRENT TREATMENT: completing 10 years of tamoxifen    INTERVAL HISTORY: Gina Nelson returns today for follow-up of her estrogen receptor positive breast cancer.   She continues on tamoxifen.  The hot flashes are better since she started taking the medication at bedtime.  She will complete 10 years on the medication in 02/2021.  Since her last visit, she has not undergone any studies relating to her breast cancer; recall she is status post bilateral mastectomies..  She did, however, present to the ED on 06/07/2020 with left-sided chest pain.  Electrocardiogram was unremarkable.  Pain was felt to be more likely to be pleurisy and chest x-ray performed showing  cardiomegaly with mild central vascular congestion.  This prompted echocardiogram on 07/05/2020 showing an ejection fraction in the 60-65% range, with normal left ventricular function.   REVIEW OF SYSTEMS: Gina Nelson tells me her best friend who was married to her cousin died from sepsis recently.  Her husband, the patient's cousin also died from complications of lung cancer recently.  This has been difficult for Gina Nelson but she has a strong faith and this is helping her.  She walks within the house--she tells me she does not like to walk outside unless her husband is with her--usually "8 laps" usually 3 times a week.  Otherwise a detailed review of systems today was benign.   COVID 19 VACCINATION STATUS: Pfizer x5, last dose  10/2020   BREAST CANCER HISTORY: From the original intake note:  The patient herself noted a mass in her right breast.  She brought it to Gina Nelson attention and worked her up for diagnostic mammography and ultrasonography December 29.  Gina Nelson was able to palpate a discrete mass in the right breast associated with skin thickening.  By mammography, there was an obscured mass associated with distortion in that area by ultrasound.  The mass was irregular and hypoechoic measuring 1.9 cm.  In the right axilla, there was an enlarged lymph node measuring 2.1 cm.  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  This appeared to be low grade and strongly ER positive at 100%, PR was 34%, the MIB-1 was 41% and there was no evidence of HER2/neu amplification by CISH with a ratio of 1.58.  With this information, the patient was referred to Gina Nelson and bilateral breast MRIs were obtained on January 11.  The MRI found a 4.9 cm lobulated, irregular mass with spiculated margins in the right breast at the 7 o'clock location with some skin and trabecular enhancement. There was linear enhancement extending to the nipple, but no apparent extension to the pectoralis muscle.  There were two lymph nodes noted in the right axilla, one measuring 8 mm, the other 6 mm.  There was no internal mammary adenopathy noted and the left breast was unremarkable.  Her subsequent history is as detailed below   PAST  MEDICAL HISTORY: Past Medical History:  Diagnosis Date   Allergy    Arthritis    knee   Blood transfusion    Cancer ([place])    right breast T1cN2   PONV (postoperative nausea and vomiting)     PAST SURGICAL HISTORY: Past Surgical History:  Procedure Laterality Date   ABDOMINAL HYSTERECTOMY  1984   BIOPSY   12/12/2016   Procedure: BIOPSY;  Surgeon: Gina Houston, MD;  [facility];  Service: Endoscopy;;  gastric   BREAST SURGERY  right MRM, left simple mastectomy   2012   BREAST SURGERY  left mastectomy   2012   COLONOSCOPY  05/07/2011   Procedure: COLONOSCOPY;  Surgeon: Gina Houston, MD;  Location: [facility];  Service: Endoscopy;  Laterality: N/A;  830   ESOPHAGOGASTRODUODENOSCOPY N/A 12/12/2016   Procedure: ESOPHAGOGASTRODUODENOSCOPY (EGD);  Surgeon: Gina Houston, MD;  [facility]: [facility];  Service: Endoscopy;  Laterality: N/A;  9:15   JOINT REPLACEMENT  11/2009   right knee   PORT-A-CATH REMOVAL  07/16/10   TOTAL KNEE ARTHROPLASTY  2011    FAMILY HISTORY Family History  Problem Relation Age of Onset   Prostate cancer Father    Pancreatic cancer Sister    Pneumonia Mother    Hypertension Daughter   The patient's father had prostate cancer metastatic to bone. He died at the age of 78. She lost a sister to recurrent pancreatic cancer in 05/2018. There is no history of breast or ovarian cancer in the family to the patient's knowledge.   GYNECOLOGIC HISTORY: Menarche age 29. She stopped having periods of course with her hysterectomy in 1984. She is Gx, P1. She was 18 at the time of that delivery. She used hormone replacement for about two and a half years.   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 Her husband, Gina Nelson, is a retired Airline pilot and currently a Curator. Daughter, Gina Nelson, keeps a home for handicapped girls. Her husband, Gina Nelson, works in the Therapist, sports business. The patient has one granddaughter who is in her late teens.    ADVANCED DIRECTIVES: In the absence of any documentation to the contrary, the patient's spouse is their HCPOA.    HEALTH MAINTENANCE: Social History   Tobacco Use   Smoking status: Never   Smokeless tobacco: Never  Vaping  Use   Vaping Use: Never used  Substance Use Topics   Alcohol use: No   Drug use: No    Allergies  Allergen Reactions   Codeine Itching    Arms and face    Current Outpatient Medications  Medication Sig Dispense Refill   acetaminophen (TYLENOL) 500 MG tablet Take 1,000 mg by mouth 2 (two) times daily as needed for headache.      betamethasone dipropionate 0.05 % cream Apply finger tip amount twice weekly as needed for discomfort 45 g 0   clotrimazole-betamethasone (LOTRISONE) cream Apply 1 application topically 2 (two) times daily. 45 g 0   EQ LORATADINE 10 MG tablet Take 1 tablet by mouth once daily 90 tablet 0   ergocalciferol (VITAMIN D2) 1.25 MG (50000 UT) capsule Take 1 capsule (50,000 Units total) by mouth once a week. One capsule once weekly 12 capsule 1   meclizine (ANTIVERT) 12.5 MG tablet Take 1 tablet (12.5 mg total) by mouth 3 (three) times daily as needed for dizziness. 21 tablet 0   naproxen (NAPROSYN)  500 MG tablet Take 1 tablet (500 mg total) by mouth 2 (two) times daily with a meal. 30 tablet 0   tamoxifen (NOLVADEX) 20 MG tablet Take 1 tablet (20 mg total) by mouth at bedtime. Stope last day of February 2023 90 tablet 1   No current facility-administered medications for this visit.    OBJECTIVE: African-American woman who appears well  Vitals:   01/02/21 1005  BP: 138/73  Pulse: 66  Resp: 16  Temp: 97.9 F (36.6 C)  SpO2: 99%      Body mass index is 26.78 kg/m.    ECOG FS: 1  Sclerae unicteric, EOMs intact Wearing a mask No cervical or supraclavicular adenopathy Lungs no rales or rhonchi Heart regular rate and rhythm Abd soft, nontender, positive bowel sounds MSK no focal spinal tenderness, no upper extremity lymphedema Neuro: nonfocal, well oriented, appropriate affect Breasts: Status post bilateral mastectomies with no evidence of chest wall recurrence.  Both axillae are benign.   LAB RESULTS: Lab Results  Component Value Date   WBC 6.6  01/02/2021   NEUTROABS 4.5 01/02/2021   HGB 11.1 (L) 01/02/2021   HCT 35.7 (L) 01/02/2021   MCV 85.0 01/02/2021   PLT 174 01/02/2021      Chemistry      Component Value Date/Time   NA 142 01/02/2021 0952   NA 143 01/19/2020 0909   NA 142 12/08/2016 1228   K 3.5 01/02/2021 0952   K 3.7 12/08/2016 1228   CL 109 01/02/2021 0952   CL 109 (H) 11/27/2011 1454   CO2 27 01/02/2021 0952   CO2 25 12/08/2016 1228   BUN 12 01/02/2021 0952   BUN 10 01/19/2020 0909   BUN 10.0 12/08/2016 1228   CREATININE 0.90 01/02/2021 0952   CREATININE 0.91 12/27/2018 0833   CREATININE 0.9 12/08/2016 1228      Component Value Date/Time   CALCIUM 9.0 01/02/2021 0952   CALCIUM 9.2 12/08/2016 1228   ALKPHOS 94 01/02/2021 0952   ALKPHOS 89 12/08/2016 1228   AST 16 01/02/2021 0952   AST 17 12/08/2016 1228   ALT 7 01/02/2021 0952   ALT 8 12/08/2016 1228   BILITOT 0.5 01/02/2021 0952   BILITOT 0.31 12/08/2016 1228       Lab Results  Component Value Date   LABCA2 25 11/27/2011    No components found for: [id number]  No results for input(s): INR in the last 168 hours.  Urinalysis    Component Value Date/Time   COLORURINE YELLOW 11/13/2013 1430   APPEARANCEUR CLEAR 11/13/2013 1430   LABSPEC 1.010 11/13/2013 1430   PHURINE 6.5 11/13/2013 1430   GLUCOSEU NEGATIVE 11/13/2013 1430   HGBUR NEGATIVE 11/13/2013 1430   HGBUR negative 02/21/2009 1509   BILIRUBINUR negative 11/22/2019 0912   BILIRUBINUR small 07/22/2016 1037   KETONESUR trace (5) (A) 11/22/2019 0912   KETONESUR NEGATIVE 11/13/2013 1430   PROTEINUR 100 07/22/2016 1037   PROTEINUR NEGATIVE 11/13/2013 1430   UROBILINOGEN 0.2 11/22/2019 0912   UROBILINOGEN 0.2 11/13/2013 1430   NITRITE Negative 11/22/2019 0912   NITRITE neg 07/22/2016 1037   NITRITE NEGATIVE 11/13/2013 1430   LEUKOCYTESUR Negative 11/22/2019 0912    STUDIES: No results found.    ASSESSMENT: 70 y.o.  [place] woman status post right breast and axillary lymph  node biopsy January 2012, both positive for an invasive ductal carcinoma, grade 3, measuring 4.9 cm by MRI, and so stage IIB/IIIA clinically. The tumor was ER positive, PR positive, HER2 negative, with  an elevated MIB-1.   (1) She received neoadjuvant docetaxel/ cyclophosphamide x4 followed by doxorubicin/ cyclophosphamide x1, at which point neoadjuvant treatment was discontinued because of poor tolerance  (2) she underwent right modified radical mastectomy with left prophylactic mastectomy June 2012. The left side was benign. On the right, she had 2 areas of residual tumor, one measuring 1.1 cm, the other less than a centimeter, both grade 3, involving 4 out of 13 lymph nodes sampled. Repeat HER2 was again negative, and repeat estrogen and progesterone receptors were again positive at 94% and 83%, respectively.   (3) adjuvant radiation treatments completed September of 2012, at which time she started letrozole, discontinued because of arthralgias/myalgias  (4) tamoxifen started February 2013, completing 10 years February 2023  (5) right mid lobe thyroid nodule, requiring annual follow-up  (a) ultrasound of the thyroid 10/30/2016 shows a 1.4 cm right nodule requiring annual follow-up  (b) thyroid ultrasound 10/18/2017 stable   PLAN:  Oletta is now 9-1/2 years out from definitive surgery for her breast cancer with no evidence of disease recurrence.  This is very favorable.  She will complete 10 years of tamoxifen in 2 months.  She understands we do not have data for continuing beyond 10 years.  That does not mean it would be wrong to do so simply that we do not know if it is helpful harmful or makes no difference.  Accordingly we normally stop tamoxifen at this point.  She is agreeable to that  I offered her our sympathy on her recent losses.  I feel comfortable releasing her to her primary care physician.  All she will need in terms of breast cancer follow-up is her yearly mammography and a  yearly physician breast exam.  We will be glad to see Cassidee at any point in the future if and when the need arises but as of now are making no further routine appointments for her here.  Total encounter time 20 minutes.*   Caillou Minus, Gina Dad, MD  01/02/21 1:18 PM Medical Oncology and Hematology [facility] [place], [place] [address] Tel. [phone]    Fax. [phone]    I, Wilburn Mylar, am acting as scribe for Dr. Virgie Nelson. Mekhia Brogan.  I, Lurline Del MD, have reviewed the above documentation for accuracy and completeness, and I agree with the above.   *Total Encounter Time as defined by the Centers for Medicare and Medicaid Services includes, in addition to the face-to-face time of a patient visit (documented in the note above) non-face-to-face time: obtaining and reviewing outside history, ordering and reviewing medications, tests or procedures, care coordination (communications with other health care professionals or caregivers) and documentation in the medical record.

## 2021-01-02 ENCOUNTER — Other Ambulatory Visit: Payer: Self-pay

## 2021-01-02 ENCOUNTER — Inpatient Hospital Stay: Payer: Medicare PPO | Attending: Oncology

## 2021-01-02 ENCOUNTER — Inpatient Hospital Stay (HOSPITAL_BASED_OUTPATIENT_CLINIC_OR_DEPARTMENT_OTHER): Payer: Medicare PPO | Admitting: Oncology

## 2021-01-02 VITALS — BP 138/73 | HR 66 | Temp 97.9°F | Resp 16 | Ht 65.0 in | Wt 160.9 lb

## 2021-01-02 DIAGNOSIS — C50811 Malignant neoplasm of overlapping sites of right female breast: Secondary | ICD-10-CM

## 2021-01-02 DIAGNOSIS — Z923 Personal history of irradiation: Secondary | ICD-10-CM | POA: Diagnosis not present

## 2021-01-02 DIAGNOSIS — Z9013 Acquired absence of bilateral breasts and nipples: Secondary | ICD-10-CM | POA: Insufficient documentation

## 2021-01-02 DIAGNOSIS — C50911 Malignant neoplasm of unspecified site of right female breast: Secondary | ICD-10-CM | POA: Diagnosis not present

## 2021-01-02 DIAGNOSIS — Z9221 Personal history of antineoplastic chemotherapy: Secondary | ICD-10-CM | POA: Diagnosis not present

## 2021-01-02 DIAGNOSIS — Z170 Estrogen receptor positive status [ER+]: Secondary | ICD-10-CM | POA: Diagnosis not present

## 2021-01-02 LAB — CBC WITH DIFFERENTIAL (CANCER CENTER ONLY)
Abs Immature Granulocytes: 0.01 10*3/uL (ref 0.00–0.07)
Basophils Absolute: 0 10*3/uL (ref 0.0–0.1)
Basophils Relative: 0 %
Eosinophils Absolute: 0.1 10*3/uL (ref 0.0–0.5)
Eosinophils Relative: 2 %
HCT: 35.7 % — ABNORMAL LOW (ref 36.0–46.0)
Hemoglobin: 11.1 g/dL — ABNORMAL LOW (ref 12.0–15.0)
Immature Granulocytes: 0 %
Lymphocytes Relative: 21 %
Lymphs Abs: 1.4 10*3/uL (ref 0.7–4.0)
MCH: 26.4 pg (ref 26.0–34.0)
MCHC: 31.1 g/dL (ref 30.0–36.0)
MCV: 85 fL (ref 80.0–100.0)
Monocytes Absolute: 0.6 10*3/uL (ref 0.1–1.0)
Monocytes Relative: 9 %
Neutro Abs: 4.5 10*3/uL (ref 1.7–7.7)
Neutrophils Relative %: 68 %
Platelet Count: 174 10*3/uL (ref 150–400)
RBC: 4.2 MIL/uL (ref 3.87–5.11)
RDW: 16 % — ABNORMAL HIGH (ref 11.5–15.5)
WBC Count: 6.6 10*3/uL (ref 4.0–10.5)
nRBC: 0 % (ref 0.0–0.2)

## 2021-01-02 LAB — CMP (CANCER CENTER ONLY)
ALT: 7 U/L (ref 0–44)
AST: 16 U/L (ref 15–41)
Albumin: 3.9 g/dL (ref 3.5–5.0)
Alkaline Phosphatase: 94 U/L (ref 38–126)
Anion gap: 6 (ref 5–15)
BUN: 12 mg/dL (ref 8–23)
CO2: 27 mmol/L (ref 22–32)
Calcium: 9 mg/dL (ref 8.9–10.3)
Chloride: 109 mmol/L (ref 98–111)
Creatinine: 0.9 mg/dL (ref 0.44–1.00)
GFR, Estimated: >60 mL/min (ref >60)
Glucose, Bld: 84 mg/dL (ref 70–99)
Potassium: 3.5 mmol/L (ref 3.5–5.1)
Sodium: 142 mmol/L (ref 135–145)
Total Bilirubin: 0.5 mg/dL (ref 0.3–1.2)
Total Protein: 6.7 g/dL (ref 6.5–8.1)

## 2021-01-02 MED ORDER — TAMOXIFEN CITRATE 20 MG PO TABS: 20.0000 mg | ORAL_TABLET | Freq: Every day | ORAL | 1 refills | Status: DC

## 2021-01-22 ENCOUNTER — Encounter: Payer: Medicare PPO | Admitting: Family Medicine

## 2021-02-11 DIAGNOSIS — R7303 Prediabetes: Secondary | ICD-10-CM | POA: Diagnosis not present

## 2021-02-11 DIAGNOSIS — E559 Vitamin D deficiency, unspecified: Secondary | ICD-10-CM | POA: Diagnosis not present

## 2021-02-11 DIAGNOSIS — Z13220 Encounter for screening for lipoid disorders: Secondary | ICD-10-CM | POA: Diagnosis not present

## 2021-02-12 LAB — CMP14+EGFR
ALT: 9 IU/L (ref 0–32)
AST: 18 IU/L (ref 0–40)
Albumin/Globulin Ratio: 1.8 (ref 1.2–2.2)
Albumin: 4.2 g/dL (ref 3.8–4.8)
Alkaline Phosphatase: 124 IU/L — ABNORMAL HIGH (ref 44–121)
BUN/Creatinine Ratio: 11 — ABNORMAL LOW (ref 12–28)
BUN: 10 mg/dL (ref 8–27)
Bilirubin Total: 0.4 mg/dL (ref 0.0–1.2)
CO2: 23 mmol/L (ref 20–29)
Calcium: 9.3 mg/dL (ref 8.7–10.3)
Chloride: 108 mmol/L — ABNORMAL HIGH (ref 96–106)
Creatinine, Ser: 0.92 mg/dL (ref 0.57–1.00)
Globulin, Total: 2.4 g/dL (ref 1.5–4.5)
Glucose: 77 mg/dL (ref 70–99)
Potassium: 4.2 mmol/L (ref 3.5–5.2)
Sodium: 143 mmol/L (ref 134–144)
Total Protein: 6.6 g/dL (ref 6.0–8.5)
eGFR: 67 mL/min/{1.73_m2} (ref >59)

## 2021-02-12 LAB — LIPID PANEL
Chol/HDL Ratio: 2.4 ratio (ref 0.0–4.4)
Cholesterol, Total: 161 mg/dL (ref 100–199)
HDL: 67 mg/dL (ref >39)
LDL Chol Calc (NIH): 77 mg/dL (ref 0–99)
Triglycerides: 92 mg/dL (ref 0–149)
VLDL Cholesterol Cal: 17 mg/dL (ref 5–40)

## 2021-02-12 LAB — CBC
Hematocrit: 37.2 % (ref 34.0–46.6)
Hemoglobin: 11.6 g/dL (ref 11.1–15.9)
MCH: 26.8 pg (ref 26.6–33.0)
MCHC: 31.2 g/dL — ABNORMAL LOW (ref 31.5–35.7)
MCV: 86 fL (ref 79–97)
Platelets: 177 10*3/uL (ref 150–450)
RBC: 4.33 x10E6/uL (ref 3.77–5.28)
RDW: 14.5 % (ref 11.7–15.4)
WBC: 6.7 10*3/uL (ref 3.4–10.8)

## 2021-02-12 LAB — TSH: TSH: 1.66 u[IU]/mL (ref 0.450–4.500)

## 2021-02-12 LAB — VITAMIN D 25 HYDROXY (VIT D DEFICIENCY, FRACTURES): Vit D, 25-Hydroxy: 19 ng/mL — ABNORMAL LOW (ref 30.0–100.0)

## 2021-02-15 ENCOUNTER — Other Ambulatory Visit: Payer: Self-pay

## 2021-02-15 ENCOUNTER — Ambulatory Visit (INDEPENDENT_AMBULATORY_CARE_PROVIDER_SITE_OTHER): Payer: Medicare PPO | Admitting: Family Medicine

## 2021-02-15 VITALS — BP 123/61 | HR 64 | Resp 16 | Ht 65.0 in | Wt 159.0 lb

## 2021-02-15 DIAGNOSIS — Z0000 Encounter for general adult medical examination without abnormal findings: Secondary | ICD-10-CM | POA: Diagnosis not present

## 2021-02-15 DIAGNOSIS — Z1211 Encounter for screening for malignant neoplasm of colon: Secondary | ICD-10-CM

## 2021-02-15 DIAGNOSIS — D126 Benign neoplasm of colon, unspecified: Secondary | ICD-10-CM

## 2021-02-15 NOTE — Patient Instructions (Signed)
F/u in September, call if you need me sooner  Excellent labs and exam  NEED to take vit D3, 2000 IU one daily this is OTC, your level is low, and this is good for bone and muscle health  Take  TUMS one every day for bone health  You are referred for a colonoscopy this is due   It is important that you exercise regularly at least 30 minutes 5 times a week. If you develop chest pain, have severe difficulty breathing, or feel very tired, stop exercising immediately and seek medical attention  Thanks for choosing [facility], we consider it a privelige to serve you.

## 2021-02-15 NOTE — Assessment & Plan Note (Signed)

## 2021-02-18 ENCOUNTER — Encounter: Payer: Self-pay | Admitting: Family Medicine

## 2021-02-18 NOTE — Progress Notes (Signed)
° ° °  Gina Nelson     MRN: [id number]      DOB: 02-28-1950  HPI: Patient is in for annual physical exam. No other health concerns are expressed or addressed at the visit. Recent labs,  are reviewed. Immunization is reviewed , and  updated if needed.   PE: BP 123/61    Pulse 64    Resp 16    Ht 5\' 5"  (1.651 m)    Wt 159 lb (72.1 kg)    SpO2 98%    BMI 26.46 kg/m   Pleasant  female, alert and oriented x 3, in no cardio-pulmonary distress. Afebrile. HEENT No facial trauma or asymetry. Sinuses non tender.  Extra occullar muscles intact.. External ears normal, . Neck: supple, no adenopathy,JVD or thyromegaly.No bruits.  Chest: Clear to ascultation bilaterally.No crackles or wheezes. Non tender to palpation  Cardiovascular system; Heart sounds normal,  S1 and  S2 ,no S3.  No murmur, or thrill. Apical beat not displaced Peripheral pulses normal.  Abdomen: Soft, non tender, no organomegaly or masses. No bruits. Bowel sounds normal. No guarding, tenderness or rebound.    Musculoskeletal exam: Full ROM of spine, hips , shoulders and knees. No deformity ,swelling or crepitus noted. No muscle wasting or atrophy.   Neurologic: Cranial nerves 2 to 12 intact. Power, tone ,sensation and reflexes normal throughout. No disturbance in gait. No tremor.  Skin: Intact, no ulceration, erythema , scaling or rash noted. Pigmentation normal throughout  Psych; Normal mood and affect. Judgement and concentration normal   Assessment & Plan:  Annual physical exam Annual exam as documented. Counseling done  re healthy lifestyle involving commitment to 150 minutes exercise per week, heart healthy diet, and attaining healthy weight.The importance of adequate sleep also discussed. Regular seat belt use and home safety, is also discussed. Changes in health habits are decided on by the patient with goals and time frames  set for achieving them. Immunization and cancer screening needs are  specifically addressed at this visit.

## 2021-02-19 ENCOUNTER — Encounter (INDEPENDENT_AMBULATORY_CARE_PROVIDER_SITE_OTHER): Payer: Self-pay | Admitting: *Deleted

## 2021-03-07 ENCOUNTER — Encounter (INDEPENDENT_AMBULATORY_CARE_PROVIDER_SITE_OTHER): Payer: Self-pay | Admitting: *Deleted

## 2021-03-08 ENCOUNTER — Other Ambulatory Visit (INDEPENDENT_AMBULATORY_CARE_PROVIDER_SITE_OTHER): Payer: Self-pay

## 2021-03-08 ENCOUNTER — Telehealth: Payer: Self-pay

## 2021-03-08 DIAGNOSIS — Z1211 Encounter for screening for malignant neoplasm of colon: Secondary | ICD-10-CM

## 2021-03-08 NOTE — Telephone Encounter (Signed)
Placard drop off

## 2021-03-21 ENCOUNTER — Telehealth: Payer: Self-pay | Admitting: Family Medicine

## 2021-03-21 NOTE — Telephone Encounter (Signed)
Patient called in to check on disability placecard form.  ? ?Forms dropped off on 2/24 ?

## 2021-03-21 NOTE — Telephone Encounter (Signed)
Left voicemail for patient

## 2021-03-26 NOTE — Telephone Encounter (Signed)
Please contact patient back when the handi placard will be signed. ?

## 2021-03-27 NOTE — Telephone Encounter (Signed)
Awaiting signature from Dr [name] ?

## 2021-03-28 ENCOUNTER — Telehealth (INDEPENDENT_AMBULATORY_CARE_PROVIDER_SITE_OTHER): Payer: Self-pay | Admitting: *Deleted

## 2021-03-28 NOTE — Telephone Encounter (Signed)
Referring MD/PCP: simpson ? ?Procedure: tcs ? ?Reason/Indication:  screening ? ?Has patient had this procedure before?  Yes, 04/2011 ? If so, when, by whom and where?   ? ?Is there a family history of colon cancer?  no ? Who?  What age when diagnosed?   ? ?Is patient diabetic? If yes, Type 1 or Type 2   no ?     ?Does patient have prosthetic heart valve or mechanical valve?  no ? ?Do you have a pacemaker/defibrillator?  no ? ?Has patient ever had endocarditis/atrial fibrillation? no ? ?Does patient use oxygen? no ? ?Has patient had joint replacement within last 12 months?  no ? ?Is patient constipated or do they take laxatives? no ? ?Does patient have a history of alcohol/drug use?  no ? ?Have you had a stroke/heart attack last 6 mths? no ? ?Do you take medicine for weight loss?  no ? ?For female patients,: have you had a hysterectomy yes ?                     are you post menopausal  ?                     do you still have your menstrual cycle no ? ?Is patient on blood thinner such as Coumadin, Plavix and/or Aspirin? no ? ?Medications: tamoxifen 20 mg daily ? ?Allergies: codeine ? ?Medication Adjustment per Dr Rehman/Dr Jenetta Downer  ? ?Procedure date & time: 04/25/21  ? ? ?

## 2021-04-01 NOTE — Telephone Encounter (Signed)
Dropped off another form ? ?Copied ?Sleeved ?Noted ? NEED ASAP ?

## 2021-04-02 NOTE — Telephone Encounter (Signed)
Called patient to pick up form.  ?

## 2021-04-17 NOTE — Patient Instructions (Signed)
? ? ? ? ? ? Gina Nelson ? 04/17/2021  ?  ? '@PREFPERIOPPHARMACY'$ @ ? ? Your procedure is scheduled on  04/25/2021. ? ? Report to [facility] at  0830  A.M. ? ? Call this number if you have problems the morning of surgery: ? [phone] ? ? Remember: ? Follow the diet and prep instructions given to you by the office. ?  ? Take these medicines the morning of surgery with A SIP OF WATER  ? ?None. ?  ? Do not wear jewelry, make-up or nail polish. ? Do not wear lotions, powders, or perfumes, or deodorant. ? Do not shave 48 hours prior to surgery.  Men may shave face and neck. ? Do not bring valuables to the hospital. ? [facility] is not responsible for any belongings or valuables. ? ?Contacts, dentures or bridgework may not be worn into surgery.  Leave your suitcase in the car.  After surgery it may be brought to your room. ? ?For patients admitted to the hospital, discharge time will be determined by your treatment team. ? ?Patients discharged the day of surgery will not be allowed to drive home and must  have someone with them for 24 hours.  ? ? ?Special instructions:   DO NOT smoke tobacco or vape for 24 hours before your procedure. ? ?Please read over the following fact sheets that you were given. ?Anesthesia Post-op Instructions and Care and Recovery After Surgery ?  ? ? ? Colonoscopy, Adult, Care After ?This sheet gives you information about how to care for yourself after your procedure. Your health care provider may also give you more specific instructions. If you have problems or questions, contact your health care provider. ?What can I expect after the procedure? ?After the procedure, it is common to have: ?A small amount of blood in your stool for 24 hours after the procedure. ?Some gas. ?Mild cramping or bloating of your abdomen. ?Follow these instructions at home: ?Eating and drinking ? ?Drink enough fluid to keep your urine pale yellow. ?Follow instructions from your health care provider about eating or  drinking restrictions. ?Resume your normal diet as instructed by your health care provider. Avoid heavy or fried foods that are hard to digest. ?Activity ?Rest as told by your health care provider. ?Avoid sitting for a long time without moving. Get up to take short walks every 1-2 hours. This is important to improve blood flow and breathing. Ask for help if you feel weak or unsteady. ?Return to your normal activities as told by your health care provider. Ask your health care provider what activities are safe for you. ?Managing cramping and bloating ? ?Try walking around when you have cramps or feel bloated. ?Apply heat to your abdomen as told by your health care provider. Use the heat source that your health care provider recommends, such as a moist heat pack or a heating pad. ?Place a towel between your skin and the heat source. ?Leave the heat on for 20-30 minutes. ?Remove the heat if your skin turns bright red. This is especially important if you are unable to feel pain, heat, or cold. You may have a greater risk of getting burned. ?General instructions ?If you were given a sedative during the procedure, it can affect you for several hours. Do not drive or operate machinery until your health care provider says that it is safe. ?For the first 24 hours after the procedure: ?Do not sign important documents. ?Do not drink alcohol. ?Do your  regular daily activities at a slower pace than normal. ?Eat soft foods that are easy to digest. ?Take over-the-counter and prescription medicines only as told by your health care provider. ?Keep all follow-up visits as told by your health care provider. This is important. ?Contact a health care provider if: ?You have blood in your stool 2-3 days after the procedure. ?Get help right away if you have: ?More than a small spotting of blood in your stool. ?Large blood clots in your stool. ?Swelling of your abdomen. ?Nausea or vomiting. ?A fever. ?Increasing pain in your abdomen that is  not relieved with medicine. ?Summary ?After the procedure, it is common to have a small amount of blood in your stool. You may also have mild cramping and bloating of your abdomen. ?If you were given a sedative during the procedure, it can affect you for several hours. Do not drive or operate machinery until your health care provider says that it is safe. ?Get help right away if you have a lot of blood in your stool, nausea or vomiting, a fever, or increased pain in your abdomen. ?This information is not intended to replace advice given to you by your health care provider. Make sure you discuss any questions you have with your health care provider. ?Document Revised: 11/05/2018 Document Reviewed: 07/26/2018 ?Elsevier Patient Education ? [place]. ?Monitored Anesthesia Care, Care After ?This sheet gives you information about how to care for yourself after your procedure. Your health care provider may also give you more specific instructions. If you have problems or questions, contact your health care provider. ?What can I expect after the procedure? ?After the procedure, it is common to have: ?Tiredness. ?Forgetfulness about what happened after the procedure. ?Impaired judgment for important decisions. ?Nausea or vomiting. ?Some difficulty with balance. ?Follow these instructions at home: ?For the time period you were told by your health care provider: ?  ?Rest as needed. ?Do not participate in activities where you could fall or become injured. ?Do not drive or use machinery. ?Do not drink alcohol. ?Do not take sleeping pills or medicines that cause drowsiness. ?Do not make important decisions or sign legal documents. ?Do not take care of children on your own. ?Eating and drinking ?Follow the diet that is recommended by your health care provider. ?Drink enough fluid to keep your urine pale yellow. ?If you vomit: ?Drink water, juice, or soup when you can drink without vomiting. ?Make sure you have little or  no nausea before eating solid foods. ?General instructions ?Have a responsible adult stay with you for the time you are told. It is important to have someone help care for you until you are awake and alert. ?Take over-the-counter and prescription medicines only as told by your health care provider. ?If you have sleep apnea, surgery and certain medicines can increase your risk for breathing problems. Follow instructions from your health care provider about wearing your sleep device: ?Anytime you are sleeping, including during daytime naps. ?While taking prescription pain medicines, sleeping medicines, or medicines that make you drowsy. ?Avoid smoking. ?Keep all follow-up visits as told by your health care provider. This is important. ?Contact a health care provider if: ?You keep feeling nauseous or you keep vomiting. ?You feel light-headed. ?You are still sleepy or having trouble with balance after 24 hours. ?You develop a rash. ?You have a fever. ?You have redness or swelling around the IV site. ?Get help right away if: ?You have trouble breathing. ?You have new-onset confusion at home. ?  Summary ?For several hours after your procedure, you may feel tired. You may also be forgetful and have poor judgment. ?Have a responsible adult stay with you for the time you are told. It is important to have someone help care for you until you are awake and alert. ?Rest as told. Do not drive or operate machinery. Do not drink alcohol or take sleeping pills. ?Get help right away if you have trouble breathing, or if you suddenly become confused. ?This information is not intended to replace advice given to you by your health care provider. Make sure you discuss any questions you have with your health care provider. ?Document Revised: 09/15/2019 Document Reviewed: 12/02/2018 ?Elsevier Patient Education ? [place]. ? ?

## 2021-04-23 ENCOUNTER — Encounter (HOSPITAL_COMMUNITY)
Admission: RE | Admit: 2021-04-23 | Discharge: 2021-04-23 | Disposition: A | Discharge status: Home / Self Care | Payer: Medicare PPO | Source: Ambulatory Visit | Attending: Internal Medicine | Admitting: Internal Medicine

## 2021-04-23 ENCOUNTER — Encounter (HOSPITAL_COMMUNITY): Payer: Self-pay

## 2021-04-23 HISTORY — DX: Gastro-esophageal reflux disease without esophagitis: K21.9

## 2021-04-25 ENCOUNTER — Ambulatory Visit (HOSPITAL_COMMUNITY)
Admission: RE | Admit: 2021-04-25 | Discharge: 2021-04-25 | Disposition: A | Discharge status: Home / Self Care | Payer: Medicare PPO | Attending: Internal Medicine | Admitting: Internal Medicine

## 2021-04-25 ENCOUNTER — Ambulatory Visit (HOSPITAL_COMMUNITY): Payer: Medicare PPO | Admitting: Anesthesiology

## 2021-04-25 ENCOUNTER — Encounter (HOSPITAL_COMMUNITY)
Admission: RE | Disposition: A | Discharge status: Home / Self Care | Payer: Self-pay | Source: Home / Self Care | Attending: Internal Medicine

## 2021-04-25 ENCOUNTER — Encounter (INDEPENDENT_AMBULATORY_CARE_PROVIDER_SITE_OTHER): Payer: Self-pay | Admitting: *Deleted

## 2021-04-25 ENCOUNTER — Ambulatory Visit (HOSPITAL_BASED_OUTPATIENT_CLINIC_OR_DEPARTMENT_OTHER): Payer: Medicare PPO | Admitting: Anesthesiology

## 2021-04-25 DIAGNOSIS — Z86010 Personal history of colonic polyps: Secondary | ICD-10-CM

## 2021-04-25 DIAGNOSIS — K5730 Diverticulosis of large intestine without perforation or abscess without bleeding: Secondary | ICD-10-CM

## 2021-04-25 DIAGNOSIS — K635 Polyp of colon: Secondary | ICD-10-CM | POA: Diagnosis not present

## 2021-04-25 DIAGNOSIS — Z1211 Encounter for screening for malignant neoplasm of colon: Secondary | ICD-10-CM | POA: Diagnosis not present

## 2021-04-25 DIAGNOSIS — K644 Residual hemorrhoidal skin tags: Secondary | ICD-10-CM

## 2021-04-25 DIAGNOSIS — K5909 Other constipation: Secondary | ICD-10-CM | POA: Insufficient documentation

## 2021-04-25 DIAGNOSIS — Z800 Family history of malignant neoplasm of digestive organs: Secondary | ICD-10-CM | POA: Diagnosis not present

## 2021-04-25 DIAGNOSIS — Z09 Encounter for follow-up examination after completed treatment for conditions other than malignant neoplasm: Secondary | ICD-10-CM | POA: Diagnosis not present

## 2021-04-25 DIAGNOSIS — Z853 Personal history of malignant neoplasm of breast: Secondary | ICD-10-CM | POA: Insufficient documentation

## 2021-04-25 DIAGNOSIS — K219 Gastro-esophageal reflux disease without esophagitis: Secondary | ICD-10-CM | POA: Insufficient documentation

## 2021-04-25 DIAGNOSIS — Z139 Encounter for screening, unspecified: Secondary | ICD-10-CM | POA: Diagnosis not present

## 2021-04-25 HISTORY — PX: COLONOSCOPY WITH PROPOFOL: SHX5780

## 2021-04-25 HISTORY — PX: POLYPECTOMY: SHX5525

## 2021-04-25 HISTORY — PX: BIOPSY: SHX5522

## 2021-04-25 LAB — HM COLONOSCOPY

## 2021-04-25 SURGERY — COLONOSCOPY WITH PROPOFOL
Anesthesia: General

## 2021-04-25 MED ORDER — LACTATED RINGERS IV SOLN: INTRAVENOUS | Status: DC

## 2021-04-25 MED ORDER — PROPOFOL 500 MG/50ML IV EMUL
INTRAVENOUS | Status: DC | PRN
Start: 2021-04-25 — End: 2021-04-25
  Administered 2021-04-25: 100 ug/kg/min via INTRAVENOUS

## 2021-04-25 MED ORDER — PROPOFOL 10 MG/ML IV BOLUS
INTRAVENOUS | Status: DC | PRN
  Administered 2021-04-25: 100 mg via INTRAVENOUS

## 2021-04-25 NOTE — Discharge Instructions (Addendum)
No aspirin or NSAIDs for 24 hours ?Resume scheduled medications as before ?High-fiber diet ?No driving for 24 hours. ?Physician will call with biopsy results. ?

## 2021-04-25 NOTE — Anesthesia Preprocedure Evaluation (Signed)
Anesthesia Evaluation  ?Patient identified by MRN, date of birth, ID band ?Patient awake ? ? ? ?Reviewed: ?Allergy & Precautions, NPO status , Patient's Chart, lab work & pertinent test results ? ?History of Anesthesia Complications ?(+) PONV and history of anesthetic complications ? ?Airway ?Mallampati: II ? ?TM Distance: >3 FB ?Neck ROM: Full ? ? ? Dental ? ?(+) Dental Advisory Given, Missing ?  ?Pulmonary ?neg pulmonary ROS,  ?  ?Pulmonary exam normal ?breath sounds clear to auscultation ? ? ? ? ? ? Cardiovascular ?negative cardio ROS ?Normal cardiovascular exam ?Rhythm:Regular Rate:Normal ? ? ?  ?Neuro/Psych ?negative neurological ROS ? negative psych ROS  ? GI/Hepatic ?Neg liver ROS, GERD  Medicated,  ?Endo/Other  ?negative endocrine ROS ? Renal/GU ?negative Renal ROS  ?negative genitourinary ?  ?Musculoskeletal ? ?(+) Arthritis , Osteoarthritis,   ? Abdominal ?  ?Peds ?negative pediatric ROS ?(+)  Hematology ?negative hematology ROS ?(+)   ?Anesthesia Other Findings ?Right breast cancer ? Reproductive/Obstetrics ?negative OB ROS ? ?  ? ? ? ? ? ? ? ? ? ? ? ? ? ?  ?  ? ? ? ? ? ? ? ?Anesthesia Physical ?Anesthesia Plan ? ?ASA: 2 ? ?Anesthesia Plan: General  ? ?Post-op Pain Management: Minimal or no pain anticipated  ? ?Induction: Intravenous ? ?PONV Risk Score and Plan: Propofol infusion ? ?Airway Management Planned: Nasal Cannula and Natural Airway ? ?Additional Equipment:  ? ?Intra-op Plan:  ? ?Post-operative Plan:  ? ?Informed Consent: I have reviewed the patients History and Physical, chart, labs and discussed the procedure including the risks, benefits and alternatives for the proposed anesthesia with the patient or authorized representative who has indicated his/her understanding and acceptance.  ? ? ? ?Dental advisory given ? ?Plan Discussed with: CRNA and Surgeon ? ?Anesthesia Plan Comments:   ? ? ? ? ? ?Anesthesia Quick Evaluation ? ?

## 2021-04-25 NOTE — Transfer of Care (Signed)
Immediate Anesthesia Transfer of Care Note ? ?Patient: Gina Nelson ? ?Procedure(s) Performed: COLONOSCOPY WITH PROPOFOL ?POLYPECTOMY ?BIOPSY ? ?Patient Location: Short Stay ? ?Anesthesia Type:General ? ?Level of Consciousness: awake and patient cooperative ? ?Airway & Oxygen Therapy: Patient Spontanous Breathing ? ?Post-op Assessment: Report given to RN and Post -op Vital signs reviewed and stable ? ?Post vital signs: Reviewed and stable ? ?Last Vitals:  ?Vitals Value Taken Time  ?BP 116/54 04/25/21 1010  ?Temp 36.5 ?C 04/25/21 1010  ?Pulse 58   ?Resp 21 04/25/21 1010  ?SpO2 98 % 04/25/21 1010  ? ? ?Last Pain:  ?Vitals:  ? 04/25/21 1010  ?TempSrc: Oral  ?PainSc: 0-No pain  ?   ? ?Patients Stated Pain Goal: 5 (04/25/21 8938) ? ?Complications: No notable events documented. ?

## 2021-04-25 NOTE — Anesthesia Procedure Notes (Signed)
Date/Time: 04/25/2021 9:38 AM ?Performed by: Vista Deck, CRNA ?Pre-anesthesia Checklist: Patient identified, Emergency Drugs available, Suction available, Timeout performed and Patient being monitored ?Patient Re-evaluated:Patient Re-evaluated prior to induction ?Oxygen Delivery Method: Nasal Cannula ? ? ? ? ?

## 2021-04-25 NOTE — Anesthesia Postprocedure Evaluation (Signed)
Anesthesia Post Note ? ?Patient: Gina Nelson ? ?Procedure(s) Performed: COLONOSCOPY WITH PROPOFOL ?POLYPECTOMY ?BIOPSY ? ?Patient location during evaluation: Phase II ?Anesthesia Type: General ?Level of consciousness: awake and alert and oriented ?Pain management: pain level controlled ?Vital Signs Assessment: post-procedure vital signs reviewed and stable ?Respiratory status: spontaneous breathing, nonlabored ventilation and respiratory function stable ?Cardiovascular status: blood pressure returned to baseline and stable ?Postop Assessment: no apparent nausea or vomiting ?Anesthetic complications: no ? ? ?No notable events documented. ? ? ?Last Vitals:  ?Vitals:  ? 04/25/21 0836 04/25/21 1010  ?BP: (!) 152/70 (!) 116/54  ?Pulse: 67   ?Resp: 18 (!) 21  ?Temp: 36.8 ?C 36.5 ?C  ?SpO2: 100% 98%  ?  ?Last Pain:  ?Vitals:  ? 04/25/21 1010  ?TempSrc: Oral  ?PainSc: 0-No pain  ? ? ?  ?  ?  ?  ?  ?  ? ?Gina Nelson C Samanatha Brammer ? ? ? ? ?

## 2021-04-25 NOTE — Op Note (Signed)
[facility] ?Patient Name: Gina Nelson ?Procedure Date: 04/25/2021 9:14 AM ?MRN: [id number] ?Date of Birth: 21-Mar-1950 ?Attending MD: Hildred Laser , MD ?CSN: [id number] ?Age: 71 ?Admit Type: Outpatient ?Procedure:                Colonoscopy ?Indications:              High risk colon cancer surveillance: Personal  ?                          history of colonic polyps ?Providers:                Hildred Laser, MD, Rosina Lowenstein, RN, Crisann  ?                          Wynonia Lawman, Technician ?Referring MD:             Tula Nakayama, MD ?Medicines:                Propofol per Anesthesia ?Complications:            No immediate complications. ?Estimated Blood Loss:     Estimated blood loss was minimal. ?Procedure:                Pre-Anesthesia Assessment: ?                          - Prior to the procedure, a History and Physical  ?                          was performed, and patient medications and  ?                          allergies were reviewed. The patient's tolerance of  ?                          previous anesthesia was also reviewed. The risks  ?                          and benefits of the procedure and the sedation  ?                          options and risks were discussed with the patient.  ?                          All questions were answered, and informed consent  ?                          was obtained. Prior Anticoagulants: The patient has  ?                          taken no previous anticoagulant or antiplatelet  ?                          agents. ASA Grade Assessment: II - A patient with  ?  mild systemic disease. After reviewing the risks  ?                          and benefits, the patient was deemed in  ?                          satisfactory condition to undergo the procedure. ?                          After obtaining informed consent, the colonoscope  ?                          was passed under direct vision. Throughout the  ?                          procedure, the  patient's blood pressure, pulse, and  ?                          oxygen saturations were monitored continuously. The  ?                          [id number] ([id number]) was introduced through the  ?                          anus and advanced to the the cecum, identified by  ?                          appendiceal orifice and ileocecal valve. The  ?                          colonoscopy was performed without difficulty. The  ?                          patient tolerated the procedure well. The quality  ?                          of the bowel preparation was good. The ileocecal  ?                          valve, appendiceal orifice, and rectum were  ?                          photographed. ?Scope In: 9:44:23 AM ?Scope Out: 10:04:25 AM ?Scope Withdrawal Time: 0 hours 14 minutes 19 seconds  ?Total Procedure Duration: 0 hours 20 minutes 2 seconds  ?Findings: ?     The perianal and digital rectal examinations were normal. ?     A diminutive polyp was found in the proximal transverse colon. Biopsies  ?     were taken with a cold forceps for histology. ?     A few diverticula were found in the sigmoid colon. ?     External hemorrhoids were found during retroflexion. The hemorrhoids  ?     were small. ?Impression:               - One diminutive polyp in  the proximal transverse  ?                          colon. Biopsied. ?                          - Diverticulosis in the sigmoid colon. ?                          - External hemorrhoids. ?Moderate Sedation: ?     Per Anesthesia Care ?Recommendation:           - Patient has a contact number available for  ?                          emergencies. The signs and symptoms of potential  ?                          delayed complications were discussed with the  ?                          patient. Return to normal activities tomorrow.  ?                          Written discharge instructions were provided to the  ?                          patient. ?                          - High fiber  diet today. ?                          - Continue present medications. ?                          - No aspirin, ibuprofen, naproxen, or other  ?                          non-steroidal anti-inflammatory drugs for 1 day. ?                          - Await pathology results. ?                          - Repeat colonoscopy is recommended. The  ?                          colonoscopy date will be determined after pathology  ?                          results from today's exam become available for  ?                          review. ?Procedure Code(s):        --- Professional --- ?  43568, Colonoscopy, flexible; with biopsy, single  ?                          or multiple ?Diagnosis Code(s):        --- Professional --- ?                          Z86.010, Personal history of colonic polyps ?                          K63.5, Polyp of colon ?                          K64.4, Residual hemorrhoidal skin tags ?                          K57.30, Diverticulosis of large intestine without  ?                          perforation or abscess without bleeding ?CPT copyright 2019 American Medical Association. All rights reserved. ?The codes documented in this report are preliminary and upon coder review may  ?be revised to meet current compliance requirements. ?Hildred Laser, MD ?Hildred Laser, MD ?04/25/2021 10:11:19 AM ?This report has been signed electronically. ?Number of Addenda: 0 ?

## 2021-04-25 NOTE — H&P (Signed)
Gina Nelson is an 71 y.o. female.   ?Chief Complaint: Patient is here for colonoscopy. ?HPI: Patient is 71 year old African-American female who is here for surveillance colonoscopy.  Her last colonoscopy was in April 2013.  She had 2 small polyps removed and these were tubular adenomas.  It was decided she could wait 10 years before her next exam.  She has no GI complaints.  She denies abdominal pain or rectal bleeding.  She has chronic constipation and takes a laxative every third day. ?Personal history significant for breast cancer diagnosed in 2012 and she remains in remission. ?Family history is positive for pancreatic cancer in her sister who is initially diagnosed in 22s and had recurrence several years later and died at 84. ?Father had prostate cancer at age 41. ? ?Past Medical History:  ?Diagnosis Date  ? Allergy   ? Arthritis   ? knee  ? Blood transfusion   ? Cancer [facility])   ? right breast T1cN2  ? GERD (gastroesophageal reflux disease)   ? PONV (postoperative nausea and vomiting)   ? ? ?Past Surgical History:  ?Procedure Laterality Date  ? ABDOMINAL HYSTERECTOMY  1984  ? BIOPSY  12/12/2016  ? Procedure: BIOPSY;  Surgeon: Rogene Houston, MD;  Location: [facility];  Service: Endoscopy;;  gastric  ? BREAST SURGERY  right MRM, left simple mastectomy  ? 2012  ? BREAST SURGERY  left mastectomy  ? 2012  ? COLONOSCOPY  05/07/2011  ? Procedure: COLONOSCOPY;  Surgeon: Rogene Houston, MD;  Location: [facility];  Service: Endoscopy;  Laterality: N/A;  830  ? ESOPHAGOGASTRODUODENOSCOPY N/A 12/12/2016  ? Procedure: ESOPHAGOGASTRODUODENOSCOPY (EGD);  Surgeon: Rogene Houston, MD;  Location: [facility];  Service: Endoscopy;  Laterality: N/A;  9:15  ? JOINT REPLACEMENT  11/2009  ? right knee  ? PORT-A-CATH REMOVAL  07/16/10  ? TOTAL KNEE ARTHROPLASTY  2011  ? ? ?Family History  ?Problem Relation Age of Onset  ? Prostate cancer Father   ? Pancreatic cancer Sister   ? Pneumonia Mother   ? Hypertension Daughter    ? ?Social History:  reports that she has never smoked. She has never used smokeless tobacco. She reports that she does not drink alcohol and does not use drugs. ? ?Allergies:  ?Allergies  ?Allergen Reactions  ? Codeine Itching  ?  Arms and face  ? ? ?Medications Prior to Admission  ?Medication Sig Dispense Refill  ? tamoxifen (NOLVADEX) 20 MG tablet Take 20 mg by mouth daily.    ? EQ LORATADINE 10 MG tablet Take 1 tablet by mouth once daily (Patient not taking: Reported on 04/18/2021) 90 tablet 0  ? meclizine (ANTIVERT) 12.5 MG tablet Take 1 tablet (12.5 mg total) by mouth 3 (three) times daily as needed for dizziness. 21 tablet 0  ? pantoprazole (PROTONIX) 40 MG tablet Take 40 mg by mouth daily as needed (acid reflux).    ? ? ?No results found for this or any previous visit (from the past 48 hour(s)). ?No results found. ? ?Review of Systems ? ?Blood pressure (!) 152/70, pulse 67, temperature 98.3 ?F (36.8 ?C), temperature source Oral, resp. rate 18, SpO2 100 %. ?Physical Exam ?HENT:  ?   Mouth/Throat:  ?   Mouth: Mucous membranes are moist.  ?   Pharynx: Oropharynx is clear.  ?Eyes:  ?   General: No scleral icterus. ?   Conjunctiva/sclera: Conjunctivae normal.  ?Cardiovascular:  ?   Rate and Rhythm: Normal rate  and regular rhythm.  ?   Heart sounds: Normal heart sounds. No murmur heard. ?Pulmonary:  ?   Effort: Pulmonary effort is normal.  ?   Breath sounds: Normal breath sounds.  ?Abdominal:  ?   General: There is no distension.  ?   Palpations: Abdomen is soft. There is no mass.  ?   Tenderness: There is no abdominal tenderness.  ?Musculoskeletal:     ?   General: No swelling.  ?   Cervical back: Neck supple.  ?Lymphadenopathy:  ?   Cervical: No cervical adenopathy.  ?Skin: ?   General: Skin is warm and dry.  ?Neurological:  ?   Mental Status: She is alert.  ?  ? ?Assessment/Plan ? ?History of colonic adenomas. ?Surveillance colonoscopy. ? ?Hildred Laser, MD ?04/25/2021, 9:34 AM ? ? ? ?

## 2021-04-26 LAB — SURGICAL PATHOLOGY

## 2021-04-29 ENCOUNTER — Encounter (HOSPITAL_COMMUNITY): Payer: Self-pay | Admitting: Internal Medicine

## 2021-05-13 ENCOUNTER — Telehealth: Payer: Self-pay | Admitting: Family Medicine

## 2021-05-13 NOTE — Telephone Encounter (Signed)
Patient called in to see if eligible for or if needed  new covid booster . ? ?Patient wants a call back. ?

## 2021-05-14 NOTE — Telephone Encounter (Signed)
Patient aware.

## 2021-05-24 ENCOUNTER — Ambulatory Visit: Payer: Medicare PPO | Attending: Internal Medicine

## 2021-05-24 ENCOUNTER — Other Ambulatory Visit: Payer: Self-pay

## 2021-05-24 DIAGNOSIS — Z23 Encounter for immunization: Secondary | ICD-10-CM

## 2021-05-24 MED ORDER — PFIZER COVID-19 VAC BIVALENT 30 MCG/0.3ML IM SUSP
INTRAMUSCULAR | 0 refills | Status: DC
  Filled 2021-05-24: qty 0.3, 1d supply, fill #0

## 2021-05-24 NOTE — Progress Notes (Signed)
? ?  Covid-19 Vaccination Clinic ? ?Name:  KEORA ECCLESTON    ?MRN: [id number] ?DOB: 07/01/1950 ? ?05/24/2021 ? ?Ms. Corona was observed post Covid-19 immunization for 15 minutes without incident. She was provided with Vaccine Information Sheet and instruction to access the V-Safe system.  ? ?Ms. Tabares was instructed to call 911 with any severe reactions post vaccine: ?Difficulty breathing  ?Swelling of face and throat  ?A fast heartbeat  ?A bad rash all over body  ?Dizziness and weakness  ? ?Immunizations Administered   ? ? Name Date Dose VIS Date Route  ? Ambulance person Booster 05/24/2021  9:22 AM 0.3 mL 09/12/2020 Intramuscular  ? Manufacturer: [place]: [phone]  ? [place]: [id number]  ? ?  ? ? ?Covid-19 Vaccination Clinic ? ?Name:  AANIYAH STROHM    ?MRN: [id number] ?DOB: 1950-07-27 ? ?05/24/2021 ? ?Ms. Espinoza was observed post Covid-19 immunization for 15 minutes without incident. She was provided with Vaccine Information Sheet and instruction to access the V-Safe system.  ? ?Ms. Marquis was instructed to call 911 with any severe reactions post vaccine: ?Difficulty breathing  ?Swelling of face and throat  ?A fast heartbeat  ?A bad rash all over body  ?Dizziness and weakness  ? ?Immunizations Administered   ? ? Name Date Dose VIS Date Route  ? Ambulance person Booster 05/24/2021  9:22 AM 0.3 mL 09/12/2020 Intramuscular  ? Manufacturer: [place]: [id number]  ? [place]: [id number]  ? ?  ? ?

## 2021-08-12 ENCOUNTER — Ambulatory Visit (INDEPENDENT_AMBULATORY_CARE_PROVIDER_SITE_OTHER): Payer: Medicare PPO

## 2021-08-12 DIAGNOSIS — Z0000 Encounter for general adult medical examination without abnormal findings: Secondary | ICD-10-CM

## 2021-08-12 NOTE — Patient Instructions (Signed)
Gina Nelson , Thank you for taking time to come for your Medicare Wellness Visit. I appreciate your ongoing commitment to your health goals. Please review the following plan we discussed and let me know if I can assist you in the future.   Screening recommendations/referrals: Bone Density: Completed Recommended yearly ophthalmology/optometry visit for glaucoma screening and checkup Recommended yearly dental visit for hygiene and checkup  Vaccinations: Influenza vaccine: Completed Pneumococcal vaccine: Completed Shingles vaccine: Completed    Advanced directives: patient declined  Conditions/risks identified: falls, hypertension  Next appointment: 1 year   Preventive Care 3 Years and Older, Female Preventive care refers to lifestyle choices and visits with your health care provider that can promote health and wellness. What does preventive care include? A yearly physical exam. This is also called an annual well check. Dental exams once or twice a year. Routine eye exams. Ask your health care provider how often you should have your eyes checked. Personal lifestyle choices, including: Daily care of your teeth and gums. Regular physical activity. Eating a healthy diet. Avoiding tobacco and drug use. Limiting alcohol use. Practicing safe sex. Taking low-dose aspirin every day. Taking vitamin and mineral supplements as recommended by your health care provider. What happens during an annual well check? The services and screenings done by your health care provider during your annual well check will depend on your age, overall health, lifestyle risk factors, and family history of disease. Counseling  Your health care provider may ask you questions about your: Alcohol use. Tobacco use. Drug use. Emotional well-being. Home and relationship well-being. Sexual activity. Eating habits. History of falls. Memory and ability to understand (cognition). Work and work  Statistician. Reproductive health. Screening  You may have the following tests or measurements: Height, weight, and BMI. Blood pressure. Lipid and cholesterol levels. These may be checked every 5 years, or more frequently if you are over 22 years old. Skin check. Lung cancer screening. You may have this screening every year starting at age 10 if you have a 30-pack-year history of smoking and currently smoke or have quit within the past 15 years. Fecal occult blood test (FOBT) of the stool. You may have this test every year starting at age 37. Flexible sigmoidoscopy or colonoscopy. You may have a sigmoidoscopy every 5 years or a colonoscopy every 10 years starting at age 15. Hepatitis C blood test. Hepatitis B blood test. Sexually transmitted disease (STD) testing. Diabetes screening. This is done by checking your blood sugar (glucose) after you have not eaten for a while (fasting). You may have this done every 1-3 years. Bone density scan. This is done to screen for osteoporosis. You may have this done starting at age 42. Mammogram. This may be done every 1-2 years. Talk to your health care provider about how often you should have regular mammograms. Talk with your health care provider about your test results, treatment options, and if necessary, the need for more tests. Vaccines  Your health care provider may recommend certain vaccines, such as: Influenza vaccine. This is recommended every year. Tetanus, diphtheria, and acellular pertussis (Tdap, Td) vaccine. You may need a Td booster every 10 years. Zoster vaccine. You may need this after age 34. Pneumococcal 13-valent conjugate (PCV13) vaccine. One dose is recommended after age 76. Pneumococcal polysaccharide (PPSV23) vaccine. One dose is recommended after age 4. Talk to your health care provider about which screenings and vaccines you need and how often you need them. This information is not intended to replace advice  given to you by  your health care provider. Make sure you discuss any questions you have with your health care provider. Document Released: 01/26/2015 Document Revised: 09/19/2015 Document Reviewed: 10/31/2014 Elsevier Interactive Patient Education  2017 [place] Prevention in the Home Falls can cause injuries. They can happen to people of all ages. There are many things you can do to make your home safe and to help prevent falls. What can I do on the outside of my home? Regularly fix the edges of walkways and driveways and fix any cracks. Remove anything that might make you trip as you walk through a door, such as a raised step or threshold. Trim any bushes or trees on the path to your home. Use bright outdoor lighting. Clear any walking paths of anything that might make someone trip, such as rocks or tools. Regularly check to see if handrails are loose or broken. Make sure that both sides of any steps have handrails. Any raised decks and porches should have guardrails on the edges. Have any leaves, snow, or ice cleared regularly. Use sand or salt on walking paths during winter. Clean up any spills in your garage right away. This includes oil or grease spills. What can I do in the bathroom? Use night lights. Install grab bars by the toilet and in the tub and shower. Do not use towel bars as grab bars. Use non-skid mats or decals in the tub or shower. If you need to sit down in the shower, use a plastic, non-slip stool. Keep the floor dry. Clean up any water that spills on the floor as soon as it happens. Remove soap buildup in the tub or shower regularly. Attach bath mats securely with double-sided non-slip rug tape. Do not have throw rugs and other things on the floor that can make you trip. What can I do in the bedroom? Use night lights. Make sure that you have a light by your bed that is easy to reach. Do not use any sheets or blankets that are too big for your bed. They should not hang  down onto the floor. Have a firm chair that has side arms. You can use this for support while you get dressed. Do not have throw rugs and other things on the floor that can make you trip. What can I do in the kitchen? Clean up any spills right away. Avoid walking on wet floors. Keep items that you use a lot in easy-to-reach places. If you need to reach something above you, use a strong step stool that has a grab bar. Keep electrical cords out of the way. Do not use floor polish or wax that makes floors slippery. If you must use wax, use non-skid floor wax. Do not have throw rugs and other things on the floor that can make you trip. What can I do with my stairs? Do not leave any items on the stairs. Make sure that there are handrails on both sides of the stairs and use them. Fix handrails that are broken or loose. Make sure that handrails are as long as the stairways. Check any carpeting to make sure that it is firmly attached to the stairs. Fix any carpet that is loose or worn. Avoid having throw rugs at the top or bottom of the stairs. If you do have throw rugs, attach them to the floor with carpet tape. Make sure that you have a light switch at the top of the stairs and the bottom of  the stairs. If you do not have them, ask someone to add them for you. What else can I do to help prevent falls? Wear shoes that: Do not have high heels. Have rubber bottoms. Are comfortable and fit you well. Are closed at the toe. Do not wear sandals. If you use a stepladder: Make sure that it is fully opened. Do not climb a closed stepladder. Make sure that both sides of the stepladder are locked into place. Ask someone to hold it for you, if possible. Clearly mark and make sure that you can see: Any grab bars or handrails. First and last steps. Where the edge of each step is. Use tools that help you move around (mobility aids) if they are needed. These  include: Canes. Walkers. Scooters. Crutches. Turn on the lights when you go into a dark area. Replace any light bulbs as soon as they burn out. Set up your furniture so you have a clear path. Avoid moving your furniture around. If any of your floors are uneven, fix them. If there are any pets around you, be aware of where they are. Review your medicines with your doctor. Some medicines can make you feel dizzy. This can increase your chance of falling. Ask your doctor what other things that you can do to help prevent falls. This information is not intended to replace advice given to you by your health care provider. Make sure you discuss any questions you have with your health care provider. Document Released: 10/26/2008 Document Revised: 06/07/2015 Document Reviewed: 02/03/2014 Elsevier Interactive Patient Education  2017 [employer].

## 2021-08-12 NOTE — Progress Notes (Signed)
Subjective:   Gina Nelson is a 71 y.o. female who presents for Medicare Annual (Subsequent) preventive examination. I connected with  Gina Nelson on 08/12/21 by a audio enabled telemedicine application and verified that I am speaking with the correct person using two identifiers.  Patient Location: Home  Provider Location: Office/Clinic  I discussed the limitations of evaluation and management by telemedicine. The patient expressed understanding and agreed to proceed.   Review of Systems     [name] , Thank you for taking time to come for your Medicare Wellness Visit. I appreciate your ongoing commitment to your health goals. Please review the following plan we discussed and let me know if I can assist you in the future.   These are the goals we discussed:  Goals      DIET - EAT MORE FRUITS AND VEGETABLES     Exercise 3x per week (30 min per time)     Recommend increasing your routine exercise program at least 5 days a week for 15 minutes at a time as tolerated.       LIFESTYLE - DECREASE FALLS RISK        This is a list of the screening recommended for you and due dates:  Health Maintenance  Topic Date Due   Mammogram  07/24/2012   Tetanus Vaccine  02/15/2022*   Flu Shot  08/13/2021   Pneumonia Vaccine  Completed   DEXA scan (bone density measurement)  Completed   COVID-19 Vaccine  Completed   Hepatitis C Screening: USPSTF Recommendation to screen - Ages 60-79 yo.  Completed   Zoster (Shingles) Vaccine  Completed   HPV Vaccine  Aged Out   Colon Cancer Screening  Discontinued  *Topic was postponed. The date shown is not the original due date.          Objective:    There were no vitals filed for this visit. There is no height or weight on file to calculate BMI.     04/25/2021    8:24 AM 04/23/2021   11:39 AM 08/01/2020    8:53 AM 06/07/2020    8:33 PM 08/01/2019    8:11 AM 05/08/2017   10:21 AM 12/12/2016    9:03 AM  Advanced Directives  Does  Patient Have a Medical Advance Directive? No No No No No No No  Does patient want to make changes to medical advance directive?      No - Patient declined   Would patient like information on creating a medical advance directive? No - Patient declined No - Patient declined No - Patient declined  Yes (ED - Information included in AVS) No - Patient declined No - Patient declined    Current Medications (verified) Outpatient Encounter Medications as of 08/12/2021  Medication Sig   COVID-19 mRNA bivalent vaccine, Pfizer, (PFIZER COVID-19 VAC BIVALENT) injection Inject into the muscle.   meclizine (ANTIVERT) 12.5 MG tablet Take 1 tablet (12.5 mg total) by mouth 3 (three) times daily as needed for dizziness.   pantoprazole (PROTONIX) 40 MG tablet Take 40 mg by mouth daily as needed (acid reflux).   tamoxifen (NOLVADEX) 20 MG tablet Take 20 mg by mouth daily.   No facility-administered encounter medications on file as of 08/12/2021.    Allergies (verified) Codeine   History: Past Medical History:  Diagnosis Date   Allergy    Arthritis    knee   Blood transfusion    Cancer ([place])    right breast  T1cN2   GERD (gastroesophageal reflux disease)    PONV (postoperative nausea and vomiting)    Past Surgical History:  Procedure Laterality Date   ABDOMINAL HYSTERECTOMY  1984   BIOPSY  12/12/2016   Procedure: BIOPSY;  Surgeon: Rogene Houston, MD;  [facility];  Service: Endoscopy;;  gastric   BIOPSY  04/25/2021   Procedure: BIOPSY;  Surgeon: Rogene Houston, MD;  [facility];  Service: Endoscopy;;   BREAST SURGERY  right MRM, left simple mastectomy   2012   BREAST SURGERY  left mastectomy   2012   COLONOSCOPY  05/07/2011   Procedure: COLONOSCOPY;  Surgeon: Rogene Houston, MD;  [facility]: [facility];  Service: Endoscopy;  Laterality: N/A;  830   COLONOSCOPY WITH PROPOFOL N/A 04/25/2021   Procedure: COLONOSCOPY WITH PROPOFOL;  Surgeon: Rogene Houston, MD;  [facility]  [facility];  Service: Endoscopy;  Laterality: N/A;  10:00   ESOPHAGOGASTRODUODENOSCOPY N/A 12/12/2016   Procedure: ESOPHAGOGASTRODUODENOSCOPY (EGD);  Surgeon: Rogene Houston, MD;  [facility];  Service: Endoscopy;  Laterality: N/A;  9:15   JOINT REPLACEMENT  11/2009   right knee   POLYPECTOMY  04/25/2021   Procedure: POLYPECTOMY;  Surgeon: Rogene Houston, MD;  [facility];  Service: Endoscopy;;   PORT-A-CATH REMOVAL  07/16/10   TOTAL KNEE ARTHROPLASTY  2011   Family History  Problem Relation Age of Onset   Prostate cancer Father    Pancreatic cancer Sister    Pneumonia Mother    Hypertension Daughter    Social History   Socioeconomic History   Marital status: Married    Spouse name: Izell [place]   Number of children: 1   Years of education: Not on file   Highest education level: 12th grade  Occupational History   Occupation: retired  Tobacco Use   Smoking status: Never   Smokeless tobacco: Never  Vaping Use   Vaping Use: Never used  Substance and Sexual Activity   Alcohol use: No   Drug use: No   Sexual activity: Yes    Birth control/protection: Surgical  Other Topics Concern   Not on file  Social History Narrative   Has been married 75 years. Husband is a Company secretary. She is retired.Visits sick people in the hospital. Enjoys word search puzzles.   Social Determinants of Health   Financial Resource Strain: Low Risk  (08/01/2020)   Overall Financial Resource Strain (CARDIA)    Difficulty of Paying Living Expenses: Not hard at all  Food Insecurity: No Food Insecurity (08/01/2020)   Hunger Vital Sign    Worried About Running Out of Food in the Last Year: Never true    Ran Out of Food in the Last Year: Never true  Transportation Needs: No Transportation Needs (08/01/2020)   PRAPARE - Hydrologist (Medical): No    Lack of Transportation (Non-Medical): No  Physical Activity: Insufficiently Active (08/01/2020)   Exercise  Vital Sign    Days of Exercise per Week: 4 days    Minutes of Exercise per Session: 20 min  Stress: No Stress Concern Present (08/01/2020)   [place]    Feeling of Stress : Not at all  Social Connections: [place] (08/01/2020)   Social Connection and Isolation Panel [NHANES]    Frequency of Communication with Friends and Family: More than three times a week    Frequency of Social Gatherings  with Friends and Family: More than three times a week    Attends Religious Services: More than 4 times per year    Active Member of Clubs or Organizations: Yes    Attends Archivist Meetings: 1 to 4 times per year    Marital Status: Married    Tobacco Counseling Counseling given: Not Answered   Clinical Intake:                 Diabetic? No         Activities of Daily Living    04/23/2021   11:40 AM 04/23/2021   11:39 AM  In your present state of health, do you have any difficulty performing the following activities:  Hearing?  0  Vision?  0  Difficulty concentrating or making decisions?  0  Walking or climbing stairs?  0  Dressing or bathing?  0  Doing errands, shopping? 0     Patient Care Team: [name], MD as PCP - Domingo Sep, MD as Consulting Physician (General Surgery) [name], MD (Inactive) as Consulting Physician (Obstetrics and Gynecology) Magrinat, Virgie Dad, MD (Inactive) as Consulting Physician (Oncology) Madelin Headings, DO as Consulting Physician (Optometry) [name], Mechele Dawley, MD as Consulting Physician (Gastroenterology)  Indicate any recent Medical Services you may have received from other than [facility] providers in the past year (date may be approximate).     Assessment:   This is a routine wellness examination for Angelica.  Hearing/Vision screen No results found.  Dietary issues and exercise activities discussed:     Goals Addressed    None   Depression Screen    02/15/2021    9:59 AM 11/15/2020   11:23 AM 08/01/2020    8:53 AM 08/01/2020    8:51 AM 07/26/2020    9:54 AM 01/27/2020    9:02 AM 08/01/2019    8:09 AM  PHQ 2/9 Scores  PHQ - 2 Score 0 0 0 0 0 0 0    Fall Risk    02/15/2021    9:58 AM 11/15/2020   11:23 AM 08/01/2020    8:53 AM 07/26/2020    9:54 AM 01/27/2020    9:02 AM  Fall Risk   Falls in the past year? 0 1 0 0 0  Number falls in past yr: 0 0 0 0 0  Injury with Fall? 0 0 0 0 0  Risk for fall due to :   No Fall Risks No Fall Risks No Fall Risks  Follow up   Falls evaluation completed Falls evaluation completed Falls evaluation completed    FALL RISK PREVENTION PERTAINING TO THE HOME:  Any stairs in or around the home? No  If so, are there any without handrails?  N/A  Home free of loose throw rugs in walkways, pet beds, electrical cords, etc? Yes  Adequate lighting in your home to reduce risk of falls? Yes   ASSISTIVE DEVICES UTILIZED TO PREVENT FALLS:  Life alert? No  Use of a cane, walker or w/c? No  Grab bars in the bathroom? No  Shower chair or bench in shower? Yes  Elevated toilet seat or a handicapped toilet? Yes    Cognitive Function:    08/01/2020    8:54 AM  MMSE - Mini Mental State Exam  Not completed: Unable to complete        08/01/2020    8:54 AM 08/01/2019    8:13 AM 07/20/2018    8:26 AM 05/08/2017  10:24 AM 04/24/2016   10:40 AM  6CIT Screen  What Year? 0 points 0 points 0 points 0 points 0 points  What month? 0 points 0 points 0 points 0 points 0 points  What time? 0 points 0 points 0 points 0 points 0 points  Count back from 20 0 points 0 points 0 points 0 points 0 points  Months in reverse 0 points 0 points 0 points 0 points 0 points  Repeat phrase 0 points 0 points 0 points 0 points 0 points  Total Score 0 points 0 points 0 points 0 points 0 points    Immunizations Immunization History  Administered Date(s) Administered   Fluad Quad(high Dose 65+) 09/14/2018,  09/30/2019, 09/20/2020   Influenza Whole 11/08/2006   Influenza,inj,Quad PF,6+ Mos 09/22/2012, 09/20/2013, 09/12/2014, 11/05/2015, 10/07/2016, 10/01/2017   PFIZER Comirnaty(Gray Top)Covid-19 Tri-Sucrose Vaccine 04/30/2020   PFIZER(Purple Top)SARS-COV-2 Vaccination 02/19/2019, 03/12/2019, 10/20/2019   Pfizer Covid-19 Vaccine Bivalent Booster 85yr & up 11/08/2020, 05/24/2021   Pneumococcal Conjugate-13 04/24/2015   Pneumococcal Polysaccharide-23 04/24/2016   Td 08/06/2005   Zoster Recombinat (Shingrix) 07/27/2020, 10/04/2020   Zoster, Live 02/23/2012    TDAP status: Due, Education has been provided regarding the importance of this vaccine. Advised may receive this vaccine at local pharmacy or Health Dept. Aware to provide a copy of the vaccination record if obtained from local pharmacy or Health Dept. Verbalized acceptance and understanding.  Flu Vaccine status: Up to date  Pneumococcal vaccine status: Up to date  Covid-19 vaccine status: Completed vaccines  Qualifies for Shingles Vaccine? Yes   Zostavax completed Yes   Shingrix Completed?: Yes  Screening Tests Health Maintenance  Topic Date Due   MAMMOGRAM  07/24/2012   TETANUS/TDAP  02/15/2022 (Originally 08/07/2015)   INFLUENZA VACCINE  08/13/2021   Pneumonia Vaccine 71 Years old  Completed   DEXA SCAN  Completed   COVID-19 Vaccine  Completed   Hepatitis C Screening  Completed   Zoster Vaccines- Shingrix  Completed   HPV VACCINES  Aged Out   COLONOSCOPY (Pts 45-455yrInsurance coverage will need to be confirmed)  Discontinued    Health Maintenance  Health Maintenance Due  Topic Date Due   MAMMOGRAM  07/24/2012    Colorectal cancer screening: No longer required.   Mammogram status: No longer required due to a age.  Bone Density status: Completed 07/15/2019. Results reflect: Bone density results: NORMAL. Repeat every 5 years.  Lung Cancer Screening: (Low Dose CT Chest recommended if Age 71-80ears, 30 pack-year  currently smoking OR have quit w/in 15years.) does not qualify.    Additional Screening:  Hepatitis C Screening: does qualify; Completed 12/15/2014  Vision Screening: Recommended annual ophthalmology exams for early detection of glaucoma and other disorders of the eye. Is the patient up to date with their annual eye exam?  No  Who is the provider or what is the name of the office in which the patient attends annual eye exams? [facility]  Dental Screening: Recommended annual dental exams for proper oral hygiene  Community Resource Referral / Chronic Care Management: CRR required this visit?  No   CCM required this visit?  No      Plan:     I have personally reviewed and noted the following in the patient's chart:   Medical and social history Use of alcohol, tobacco or illicit drugs  Current medications and supplements including opioid prescriptions.  Functional ability and status Nutritional status Physical activity Advanced directives List of other physicians  Hospitalizations, surgeries, and ER visits in previous 12 months Vitals Screenings to include cognitive, depression, and falls Referrals and appointments  In addition, I have reviewed and discussed with patient certain preventive protocols, quality metrics, and best practice recommendations. A written personalized care plan for preventive services as well as general preventive health recommendations were provided to patient.     Johny Drilling, [place]   08/12/2021   Nurse Notes:  Ms. Bourquin , Thank you for taking time to come for your Medicare Wellness Visit. I appreciate your ongoing commitment to your health goals. Please review the following plan we discussed and let me know if I can assist you in the future.   These are the goals we discussed:  Goals      DIET - EAT MORE FRUITS AND VEGETABLES     Exercise 3x per week (30 min per time)     Recommend increasing your routine exercise program at least 5 days a  week for 15 minutes at a time as tolerated.       LIFESTYLE - DECREASE FALLS RISK        This is a list of the screening recommended for you and due dates:  Health Maintenance  Topic Date Due   Mammogram  07/24/2012   Tetanus Vaccine  02/15/2022*   Flu Shot  08/13/2021   Pneumonia Vaccine  Completed   DEXA scan (bone density measurement)  Completed   COVID-19 Vaccine  Completed   Hepatitis C Screening: USPSTF Recommendation to screen - Ages 63-79 yo.  Completed   Zoster (Shingles) Vaccine  Completed   HPV Vaccine  Aged Out   Colon Cancer Screening  Discontinued  *Topic was postponed. The date shown is not the original due date.

## 2021-09-20 ENCOUNTER — Ambulatory Visit: Payer: Medicare PPO | Admitting: Family Medicine

## 2021-09-20 ENCOUNTER — Encounter: Payer: Self-pay | Admitting: Family Medicine

## 2021-09-20 VITALS — BP 128/77 | HR 70 | Resp 18 | Ht 65.0 in | Wt 165.0 lb

## 2021-09-20 DIAGNOSIS — E041 Nontoxic single thyroid nodule: Secondary | ICD-10-CM

## 2021-09-20 DIAGNOSIS — J302 Other seasonal allergic rhinitis: Secondary | ICD-10-CM | POA: Diagnosis not present

## 2021-09-20 DIAGNOSIS — Z13220 Encounter for screening for lipoid disorders: Secondary | ICD-10-CM

## 2021-09-20 DIAGNOSIS — D126 Benign neoplasm of colon, unspecified: Secondary | ICD-10-CM | POA: Diagnosis not present

## 2021-09-20 DIAGNOSIS — E663 Overweight: Secondary | ICD-10-CM | POA: Diagnosis not present

## 2021-09-20 DIAGNOSIS — Z0000 Encounter for general adult medical examination without abnormal findings: Secondary | ICD-10-CM

## 2021-09-20 DIAGNOSIS — E559 Vitamin D deficiency, unspecified: Secondary | ICD-10-CM | POA: Diagnosis not present

## 2021-09-20 DIAGNOSIS — Z23 Encounter for immunization: Secondary | ICD-10-CM

## 2021-09-20 MED ORDER — LORATADINE 10 MG PO TABS: 10.0000 mg | ORAL_TABLET | Freq: Every day | ORAL | 3 refills | Status: DC | PRN

## 2021-09-20 MED ORDER — DIPHENHYDRAMINE-ZINC ACETATE 2-0.1 % EX CREA: 1.0000 | TOPICAL_CREAM | Freq: Three times a day (TID) | CUTANEOUS | 0 refills | Status: DC | PRN

## 2021-09-20 NOTE — Patient Instructions (Addendum)
Annual exam Feb 2024, call if you need me sooner  Flu vaccine today  Please get the RSV vaccine , check your pharmacy about this re  coverage it is recommended for adults over 60  Please get the covid booster, new one , when available in the next approx 2 weeks  Take vit D3 1000 IU once daily  Use benadryl cream to itchy areas of skin from insect bites and proitect your legs when you go out  Fasting CBC, lipid, cmp and eGFR , tSH and vit D end Feb  It is important that you exercise regularly at least 30 minutes 5 times a week. If you develop chest pain, have severe difficulty breathing, or feel very tired, stop exercising immediately and seek medical attention    Thanks for choosing [facility], we consider it a privelige to serve you.

## 2021-09-21 ENCOUNTER — Encounter: Payer: Self-pay | Admitting: Family Medicine

## 2021-09-21 NOTE — Progress Notes (Signed)
   Gina Nelson     MRN: [id number]      DOB: 1950-02-22   HPI Gina Nelson is here for follow up and re-evaluation of chronic medical conditions, medication management and review of any available recent lab and radiology data.  Preventive health is updated, specifically  Cancer screening and Immunization.   Has been d/c from oncology clinic C/o itchy skin where she has had mosquito bites  ROS Denies recent fever or chills. Denies sinus pressure, nasal congestion, ear pain or sore throat. Denies chest congestion, productive cough or wheezing. Denies chest pains, palpitations and leg swelling Denies abdominal pain, nausea, vomiting,diarrhea or constipation.   Denies dysuria, frequency, hesitancy or incontinence. Denies joint pain, swelling and limitation in mobility. Denies headaches, seizures, numbness, or tingling. Denies depression, anxiety or insomnia. Denies skin break down or rash.   PE  BP 128/77 (BP Location: Right Arm, Patient Position: Sitting, Cuff Size: Normal)   Pulse 70   Resp 18   Ht '5\' 5"'$  (1.651 m)   Wt 165 lb 0.6 oz (74.9 kg)   SpO2 97%   BMI 27.46 kg/m   Patient alert and oriented and in no cardiopulmonary distress.  HEENT: No facial asymmetry, EOMI,     Neck supple .  Chest: Clear to auscultation bilaterally.  CVS: S1, S2 no murmurs, no S3.Regular rate.  Ext: No edema  MS: Adequate ROM spine, shoulders, hips and knees.  Skin: Intact, no ulcerations or rash noted.  Psych: Good eye contact, normal affect. Memory intact not anxious or depressed appearing.  CNS: CN 2-12 intact, power,  normal throughout.no focal deficits noted.   Assessment & Plan  Seasonal allergies Controlled, no change in medication   Overweight (BMI 25.0-29.9)  Patient re-educated about  the importance of commitment to a  minimum of 150 minutes of exercise per week as able.  The importance of healthy food choices with portion control discussed, as well as eating  regularly and within a 12 hour window most days. The need to choose "clean , green" food 50 to 75% of the time is discussed, as well as to make water the primary drink and set a goal of 64 ounces water daily.       09/20/2021   10:38 AM 04/23/2021   11:29 AM 02/15/2021    9:57 AM  Weight /BMI  Weight 165 lb 0.6 oz 164 lb 159 lb  Height '5\' 5"'$  (1.651 m) '5\' 5"'$  (1.651 m) '5\' 5"'$  (1.651 m)  BMI 27.46 kg/m2 27.29 kg/m2 26.46 kg/m2      Vitamin D deficiency Recommend daily vit D3, 1000 IU

## 2021-09-21 NOTE — Assessment & Plan Note (Signed)
Recommend daily vit D3, 1000 IU

## 2021-09-21 NOTE — Assessment & Plan Note (Signed)
  Patient re-educated about  the importance of commitment to a  minimum of 150 minutes of exercise per week as able.  The importance of healthy food choices with portion control discussed, as well as eating regularly and within a 12 hour window most days. The need to choose "clean , green" food 50 to 75% of the time is discussed, as well as to make water the primary drink and set a goal of 64 ounces water daily.       09/20/2021   10:38 AM 04/23/2021   11:29 AM 02/15/2021    9:57 AM  Weight /BMI  Weight 165 lb 0.6 oz 164 lb 159 lb  Height '5\' 5"'$  (1.651 m) '5\' 5"'$  (1.651 m) '5\' 5"'$  (1.651 m)  BMI 27.46 kg/m2 27.29 kg/m2 26.46 kg/m2

## 2021-09-21 NOTE — Assessment & Plan Note (Signed)
Controlled, no change in medication  

## 2021-09-24 ENCOUNTER — Telehealth: Payer: Self-pay | Admitting: Family Medicine

## 2021-09-24 NOTE — Telephone Encounter (Signed)
Pls contact pt , let her know that when I was reviewing her record, she needs rept [place] of neck to follow up thyroid nodule, I did not discuss this at the visit. Pls schedule and let her knw appt info, thanks

## 2021-09-26 NOTE — Telephone Encounter (Signed)
Pt aware.

## 2021-10-04 ENCOUNTER — Ambulatory Visit (HOSPITAL_COMMUNITY): Payer: Medicare PPO

## 2021-10-08 ENCOUNTER — Ambulatory Visit (HOSPITAL_COMMUNITY)
Admission: RE | Admit: 2021-10-08 | Discharge: 2021-10-08 | Disposition: A | Discharge status: Home / Self Care | Payer: Medicare PPO | Source: Ambulatory Visit | Attending: Family Medicine | Admitting: Family Medicine

## 2021-10-08 DIAGNOSIS — E041 Nontoxic single thyroid nodule: Secondary | ICD-10-CM | POA: Insufficient documentation

## 2021-10-10 ENCOUNTER — Telehealth: Payer: Self-pay | Admitting: Family Medicine

## 2021-10-10 ENCOUNTER — Other Ambulatory Visit: Payer: Self-pay | Admitting: Family Medicine

## 2021-10-10 DIAGNOSIS — E041 Nontoxic single thyroid nodule: Secondary | ICD-10-CM

## 2021-10-10 NOTE — Telephone Encounter (Signed)
Pt called stating she just got off the phone with you and wants to know if you can please call her back she has more questions

## 2021-10-11 ENCOUNTER — Telehealth: Payer: Self-pay

## 2021-10-11 NOTE — Telephone Encounter (Signed)
See other tele msg  

## 2021-10-11 NOTE — Telephone Encounter (Signed)
Patient asked to leave detailed message on home answering machine, patient will be at a funeral. Needs to know where the growth was at. [phone]

## 2021-10-11 NOTE — Telephone Encounter (Signed)
I spoke directly with Ms Ackert and discussed the need for biopsy , and will ask scheduling to f/u with her appt info with Dr Redmond Baseman, visit already authorized

## 2021-10-11 NOTE — Telephone Encounter (Signed)
Spoke with pt advised of imaging result and where it was located pt has further questions and would like a call back at the end of the day if possible

## 2021-10-18 DIAGNOSIS — E041 Nontoxic single thyroid nodule: Secondary | ICD-10-CM | POA: Diagnosis not present

## 2021-10-21 ENCOUNTER — Other Ambulatory Visit (HOSPITAL_COMMUNITY): Payer: Self-pay | Admitting: Otolaryngology

## 2021-10-21 ENCOUNTER — Other Ambulatory Visit: Payer: Self-pay | Admitting: Otolaryngology

## 2021-10-21 DIAGNOSIS — E041 Nontoxic single thyroid nodule: Secondary | ICD-10-CM

## 2021-10-30 ENCOUNTER — Encounter (HOSPITAL_COMMUNITY): Payer: Self-pay

## 2021-10-30 ENCOUNTER — Ambulatory Visit (HOSPITAL_COMMUNITY)
Admission: RE | Admit: 2021-10-30 | Discharge: 2021-10-30 | Disposition: A | Discharge status: Home / Self Care | Payer: Medicare PPO | Source: Ambulatory Visit | Attending: Otolaryngology | Admitting: Otolaryngology

## 2021-10-30 DIAGNOSIS — E041 Nontoxic single thyroid nodule: Secondary | ICD-10-CM | POA: Diagnosis not present

## 2021-10-30 MED ORDER — LIDOCAINE HCL (PF) 2 % IJ SOLN
INTRAMUSCULAR | Status: AC
  Administered 2021-10-30: 10 mL
  Filled 2021-10-30: qty 10

## 2021-10-30 NOTE — Progress Notes (Signed)
PT tolerated thyroid biopsy procedure well today. Labs and afirma obtained and sent for pathology. PT ambulatory at discharge with no acute distress noted and verbalized understanding of discharge instructions. 

## 2021-11-01 LAB — CYTOLOGY - NON PAP

## 2021-12-10 DIAGNOSIS — K219 Gastro-esophageal reflux disease without esophagitis: Secondary | ICD-10-CM | POA: Diagnosis not present

## 2021-12-25 ENCOUNTER — Other Ambulatory Visit: Payer: Self-pay

## 2021-12-25 ENCOUNTER — Telehealth: Payer: Self-pay | Admitting: Family Medicine

## 2021-12-25 MED ORDER — MECLIZINE HCL 12.5 MG PO TABS: 12.5000 mg | ORAL_TABLET | Freq: Three times a day (TID) | ORAL | 0 refills | Status: DC | PRN

## 2021-12-25 NOTE — Telephone Encounter (Signed)
Prescription Request  12/25/2021  Is this a "Controlled Substance" medicine? No  LOV: 09/20/2021  What is the name of the medication or equipment? meclizine (ANTIVERT) 12.5 MG tablet   Have you contacted your pharmacy to request a refill? Yes   Which pharmacy would you like this sent to?  [facility] [place] [facility] [place] [address] [id number] [address] [place] [address] Phone: [phone] Fax: [phone]    Patient notified that their request is being sent to the clinical staff for review and that they should receive a response within 2 business days.   Please advise at [place]

## 2021-12-25 NOTE — Telephone Encounter (Signed)
Refills sent

## 2022-01-01 ENCOUNTER — Ambulatory Visit: Payer: Medicare PPO | Admitting: Internal Medicine

## 2022-01-01 ENCOUNTER — Encounter: Payer: Self-pay | Admitting: Internal Medicine

## 2022-01-01 VITALS — BP 99/61 | HR 79 | Ht 65.0 in | Wt 168.4 lb

## 2022-01-01 DIAGNOSIS — R350 Frequency of micturition: Secondary | ICD-10-CM | POA: Diagnosis not present

## 2022-01-01 LAB — POCT URINALYSIS DIP (CLINITEK)
Bilirubin, UA: NEGATIVE
Blood, UA: NEGATIVE
Glucose, UA: NEGATIVE mg/dL
Leukocytes, UA: NEGATIVE
Nitrite, UA: NEGATIVE
POC PROTEIN,UA: NEGATIVE
Spec Grav, UA: 1.025 (ref 1.010–1.025)
Urobilinogen, UA: 0.2 E.U./dL
pH, UA: 5.5 (ref 5.0–8.0)

## 2022-01-01 NOTE — Patient Instructions (Signed)
Please continue to maintain at least 64 ounces of fluid in a day.

## 2022-01-01 NOTE — Progress Notes (Signed)
Acute Office Visit  Subjective:    Patient ID: Gina Nelson, female    DOB: 1950-04-12, 71 y.o.   MRN: [id number]  Chief Complaint  Patient presents with   Urinary Tract Infection    Patient said her symptoms have been going on since Monday night. Currently frequent urination     HPI Patient is in today for c/o urinary frequency since 12/30/21.  She denies any dysuria or hematuria.  Denies any fever or chills.  Denies nausea or vomiting.  Denies any lower abdominal pain.  She tried to increase fluid intake and has seen improvement in her symptoms since last evening.  Past Medical History:  Diagnosis Date   Allergy    Arthritis    knee   Blood transfusion    Cancer ([place])    right breast T1cN2   GERD (gastroesophageal reflux disease)    PONV (postoperative nausea and vomiting)     Past Surgical History:  Procedure Laterality Date   ABDOMINAL HYSTERECTOMY  1984   BIOPSY  12/12/2016   Procedure: BIOPSY;  Surgeon: Rogene Houston, MD;  [facility];  Service: Endoscopy;;  gastric   BIOPSY  04/25/2021   Procedure: BIOPSY;  Surgeon: Rogene Houston, MD;  [facility];  Service: Endoscopy;;   BREAST SURGERY  right MRM, left simple mastectomy   2012   BREAST SURGERY  left mastectomy   2012   COLONOSCOPY  05/07/2011   Procedure: COLONOSCOPY;  Surgeon: Rogene Houston, MD;  [facility];  Service: Endoscopy;  Laterality: N/A;  830   COLONOSCOPY WITH PROPOFOL N/A 04/25/2021   Procedure: COLONOSCOPY WITH PROPOFOL;  Surgeon: Rogene Houston, MD;  [facility];  Service: Endoscopy;  Laterality: N/A;  10:00   ESOPHAGOGASTRODUODENOSCOPY N/A 12/12/2016   Procedure: ESOPHAGOGASTRODUODENOSCOPY (EGD);  Surgeon: Rogene Houston, MD;  [facility];  Service: Endoscopy;  Laterality: N/A;  9:15   JOINT REPLACEMENT  11/2009   right knee   POLYPECTOMY  04/25/2021   Procedure: POLYPECTOMY;  Surgeon: Rogene Houston, MD;  [facility];  Service: Endoscopy;;   PORT-A-CATH REMOVAL  07/16/10   TOTAL KNEE ARTHROPLASTY  2011    Family History  Problem Relation Age of Onset   Prostate cancer Father    Pancreatic cancer Sister    Pneumonia Mother    Hypertension Daughter     Social History   Socioeconomic History   Marital status: Married    Spouse name: Izell Leslie   Number of children: 1   Years of education: Not on file   Highest education level: 12th grade  Occupational History   Occupation: retired  Tobacco Use   Smoking status: Never   Smokeless tobacco: Never  Vaping Use   Vaping Use: Never used  Substance and Sexual Activity   Alcohol use: No   Drug use: No   Sexual activity: Yes    Birth control/protection: Surgical  Other Topics Concern   Not on file  Social History Narrative   Has been married 37 years. Husband is a Company secretary. She is retired.Visits sick people in the hospital. Enjoys word search puzzles.   Social Determinants of Health   Financial Resource Strain: Low Risk  (08/12/2021)   Overall Financial Resource Strain (CARDIA)    Difficulty of Paying Living Expenses: Not hard at all  Food Insecurity: No Food Insecurity (08/12/2021)   Hunger Vital Sign    Worried About Running Out  of Food in the Last Year: Never true    [place] in the Last Year: Never true  Transportation Needs: No Transportation Needs (08/12/2021)   PRAPARE - Hydrologist (Medical): No    Lack of Transportation (Non-Medical): No  Physical Activity: Sufficiently Active (08/12/2021)   Exercise Vital Sign    Days of Exercise per Week: 7 days    Minutes of Exercise per Session: 40 min  Stress: No Stress Concern Present (08/12/2021)   [place]    Feeling of Stress : Not at all  Social Connections: [place] (08/12/2021)   Social Connection and Isolation Panel [NHANES]    Frequency of Communication with  Friends and Family: More than three times a week    Frequency of Social Gatherings with Friends and Family: Twice a week    Attends Religious Services: More than 4 times per year    Active Member of [employer] or Organizations: Yes    Attends Archivist Meetings: 1 to 4 times per year    Marital Status: Married  Human resources officer Violence: Not At Risk (08/12/2021)   Humiliation, Afraid, Rape, and Kick questionnaire    Fear of Current or Ex-Partner: No    Emotionally Abused: No    Physically Abused: No    Sexually Abused: No    Outpatient Medications Prior to Visit  Medication Sig Dispense Refill   diphenhydrAMINE-zinc acetate (BENADRYL EXTRA STRENGTH) cream Apply 1 Application topically 3 (three) times daily as needed for itching. 28.4 g 0   loratadine (CLARITIN) 10 MG tablet Take 1 tablet (10 mg total) by mouth daily as needed for allergies. 30 tablet 3   meclizine (ANTIVERT) 12.5 MG tablet Take 1 tablet (12.5 mg total) by mouth 3 (three) times daily as needed for dizziness. 21 tablet 0   pantoprazole (PROTONIX) 40 MG tablet Take 40 mg by mouth daily as needed (acid reflux).     No facility-administered medications prior to visit.    No Active Allergies  Review of Systems  Constitutional:  Negative for chills and fever.  Respiratory:  Negative for cough and shortness of breath.   Cardiovascular:  Negative for chest pain and palpitations.  Gastrointestinal:  Negative for abdominal pain, nausea and vomiting.  Genitourinary:  Positive for frequency. Negative for dysuria and hematuria.  Musculoskeletal:  Negative for neck pain and neck stiffness.  Skin:  Negative for rash.  Neurological:  Negative for dizziness and weakness.       Objective:    Physical Exam Constitutional:      General: She is not in acute distress.    Appearance: She is not diaphoretic.  Cardiovascular:     Heart sounds: Normal heart sounds. No murmur heard. Pulmonary:     Breath sounds: Normal breath  sounds. No wheezing or rales.  Abdominal:     Palpations: Abdomen is soft.     Tenderness: There is no abdominal tenderness.  Neurological:     General: No focal deficit present.     Mental Status: She is alert and oriented to person, place, and time.     BP 99/61 (BP Location: Left Arm, Patient Position: Sitting, Cuff Size: Normal)   Pulse 79   Ht '5\' 5"'$  (1.651 m)   Wt 168 lb 6.4 oz (76.4 kg)   SpO2 97%   BMI 28.02 kg/m  Wt Readings from Last 3 Encounters:  01/01/22 168 lb 6.4 oz (  76.4 kg)  09/20/21 165 lb 0.6 oz (74.9 kg)  04/23/21 164 lb (74.4 kg)        Assessment & Plan:   Problem List Items Addressed This Visit    Visit Diagnoses     Urinary frequency    -  Primary UA reviewed, trace ketones, but no LE or nitrites Her symptoms have already started improving with increasing fluid intake Unlikely to be UTI Reassured her that she does not have UTI Advised to maintain adequate hydration   Relevant Orders   POCT URINALYSIS DIP (CLINITEK) (Completed)        No orders of the defined types were placed in this encounter.    Lindell Spar, MD

## 2022-02-10 DIAGNOSIS — E041 Nontoxic single thyroid nodule: Secondary | ICD-10-CM | POA: Diagnosis not present

## 2022-02-10 DIAGNOSIS — Z13220 Encounter for screening for lipoid disorders: Secondary | ICD-10-CM | POA: Diagnosis not present

## 2022-02-10 DIAGNOSIS — E559 Vitamin D deficiency, unspecified: Secondary | ICD-10-CM | POA: Diagnosis not present

## 2022-02-11 LAB — LIPID PANEL
Chol/HDL Ratio: 3.1 ratio (ref 0.0–4.4)
Cholesterol, Total: 202 mg/dL — ABNORMAL HIGH (ref 100–199)
HDL: 65 mg/dL (ref >39)
LDL Chol Calc (NIH): 125 mg/dL — ABNORMAL HIGH (ref 0–99)
Triglycerides: 68 mg/dL (ref 0–149)
VLDL Cholesterol Cal: 12 mg/dL (ref 5–40)

## 2022-02-11 LAB — CMP14+EGFR
ALT: 14 IU/L (ref 0–32)
AST: 23 IU/L (ref 0–40)
Albumin/Globulin Ratio: 1.5 (ref 1.2–2.2)
Albumin: 4 g/dL (ref 3.8–4.8)
Alkaline Phosphatase: 160 IU/L — ABNORMAL HIGH (ref 44–121)
BUN/Creatinine Ratio: 15 (ref 12–28)
BUN: 13 mg/dL (ref 8–27)
Bilirubin Total: 0.3 mg/dL (ref 0.0–1.2)
CO2: 22 mmol/L (ref 20–29)
Calcium: 9.7 mg/dL (ref 8.7–10.3)
Chloride: 106 mmol/L (ref 96–106)
Creatinine, Ser: 0.88 mg/dL (ref 0.57–1.00)
Globulin, Total: 2.7 g/dL (ref 1.5–4.5)
Glucose: 81 mg/dL (ref 70–99)
Potassium: 4.2 mmol/L (ref 3.5–5.2)
Sodium: 142 mmol/L (ref 134–144)
Total Protein: 6.7 g/dL (ref 6.0–8.5)
eGFR: 70 mL/min/{1.73_m2} (ref >59)

## 2022-02-11 LAB — CBC
Hematocrit: 37.9 % (ref 34.0–46.6)
Hemoglobin: 12 g/dL (ref 11.1–15.9)
MCH: 26.5 pg — ABNORMAL LOW (ref 26.6–33.0)
MCHC: 31.7 g/dL (ref 31.5–35.7)
MCV: 84 fL (ref 79–97)
Platelets: 178 10*3/uL (ref 150–450)
RBC: 4.53 x10E6/uL (ref 3.77–5.28)
RDW: 14.5 % (ref 11.7–15.4)
WBC: 6.8 10*3/uL (ref 3.4–10.8)

## 2022-02-11 LAB — VITAMIN D 25 HYDROXY (VIT D DEFICIENCY, FRACTURES): Vit D, 25-Hydroxy: 12.1 ng/mL — ABNORMAL LOW (ref 30.0–100.0)

## 2022-02-11 LAB — TSH: TSH: 1.35 u[IU]/mL (ref 0.450–4.500)

## 2022-02-13 ENCOUNTER — Other Ambulatory Visit: Payer: Self-pay | Admitting: Family Medicine

## 2022-02-18 ENCOUNTER — Ambulatory Visit (INDEPENDENT_AMBULATORY_CARE_PROVIDER_SITE_OTHER): Payer: Medicare PPO | Admitting: Family Medicine

## 2022-02-18 ENCOUNTER — Encounter: Payer: Self-pay | Admitting: Family Medicine

## 2022-02-18 ENCOUNTER — Other Ambulatory Visit: Payer: Self-pay | Admitting: Family Medicine

## 2022-02-18 VITALS — BP 128/70 | HR 65 | Ht 65.0 in | Wt 173.0 lb

## 2022-02-18 DIAGNOSIS — Z0000 Encounter for general adult medical examination without abnormal findings: Secondary | ICD-10-CM | POA: Insufficient documentation

## 2022-02-18 NOTE — Progress Notes (Signed)
    Gina Nelson     MRN: [id number]      DOB: [birth year]  HPI: Patient is in for annual physical exam. No other health concerns are expressed or addressed at the visit. Recent labs,  are reviewed.Needs to educe fat in diet Immunization is reviewed , and  is up to date   PE: BP 128/70   Pulse 65   Ht '5\' 5"'$  (1.651 m)   Wt 173 lb (78.5 kg)   SpO2 97%   BMI 28.79 kg/m   Pleasant  female, alert and oriented x 3, in no cardio-pulmonary distress. Afebrile. HEENT No facial trauma or asymetry. Sinuses non tender.  Extra occullar muscles intact.. External ears normal, . Neck: supple, no adenopathy,JVD or thyromegaly.No bruits.  Chest: Clear to ascultation bilaterally.No crackles or wheezes. Non tender to palpation   Cardiovascular system; Heart sounds normal,  S1 and  S2 ,no S3.  No murmur, or thrill. Apical beat not displaced Peripheral pulses normal.  Abdomen: Soft, non tender, no organomegaly or masses. No bruits. Bowel sounds normal. No guarding, tenderness or rebound.   Musculoskeletal exam: Full ROM of spine, hips , shoulders and knees. No deformity ,swelling or crepitus noted. No muscle wasting or atrophy.   Neurologic: Cranial nerves 2 to 12 intact. Power, tone ,sensation  normal throughout. No disturbance in gait. No tremor.  Skin: Intact, no ulceration, erythema , scaling or rash noted. Pigmentation normal throughout  Psych; Normal mood and affect. Judgement and concentration normal   Assessment & Plan:  Encounter for annual health examination Annual exam as documented. Counseling done  re healthy lifestyle involving commitment to 150 minutes exercise per week, heart healthy diet, and attaining healthy weight.The importance of adequate sleep also discussed. . Immunization and cancer screening needs are specifically addressed at this visit.

## 2022-02-18 NOTE — Assessment & Plan Note (Signed)
Annual exam as documented. Counseling done  re healthy lifestyle involving commitment to 150 minutes exercise per week, heart healthy diet, and attaining healthy weight.The importance of adequate sleep also discussed.  Immunization and cancer screening needs are specifically addressed at this visit.  

## 2022-02-18 NOTE — Patient Instructions (Addendum)
F/U end September, flu vaccine at visit and re evaluate weight and general health  Nurse pls obtain info on Covid vaccine from [facility] in [place] and document   Start once daily OTC vit D3 5000 IU  Increase vegetables AND FRUIT, REDUCE FRIED AND FATTY FOODS, CHOLESTEROL IS HIGH FOR THE FIRST TIME!  It is important that you exercise regularly at least 30 minutes 5 times a week. If you develop chest pain, have severe difficulty breathing, or feel very tired, stop exercising immediately and seek medical attention  Thanks for choosing [facility], we consider it a privelige to serve you.

## 2022-02-19 ENCOUNTER — Other Ambulatory Visit: Payer: Self-pay | Admitting: Family Medicine

## 2022-03-13 ENCOUNTER — Encounter: Payer: Self-pay | Admitting: Radiology

## 2022-03-14 ENCOUNTER — Telehealth: Payer: Self-pay | Admitting: Family Medicine

## 2022-03-14 NOTE — Telephone Encounter (Signed)
Pt called stating she is still having issues with the nodule on her thyroid. Wants to know what to do? Feels like food clumps in throat. Feels like something is inher throat when she turns her head a certain?

## 2022-03-20 NOTE — Telephone Encounter (Signed)
[id number]

## 2022-03-21 ENCOUNTER — Other Ambulatory Visit: Payer: Self-pay

## 2022-03-21 DIAGNOSIS — R1310 Dysphagia, unspecified: Secondary | ICD-10-CM

## 2022-03-21 NOTE — Telephone Encounter (Signed)
Referral placed patient agreed and aware

## 2022-03-24 ENCOUNTER — Encounter (INDEPENDENT_AMBULATORY_CARE_PROVIDER_SITE_OTHER): Payer: Self-pay | Admitting: *Deleted

## 2022-05-08 ENCOUNTER — Encounter (INDEPENDENT_AMBULATORY_CARE_PROVIDER_SITE_OTHER): Payer: Self-pay | Admitting: Gastroenterology

## 2022-05-08 ENCOUNTER — Ambulatory Visit (INDEPENDENT_AMBULATORY_CARE_PROVIDER_SITE_OTHER): Payer: Medicare PPO | Admitting: Gastroenterology

## 2022-05-08 VITALS — BP 128/80 | HR 73 | Temp 97.1°F | Ht 65.0 in | Wt 174.2 lb

## 2022-05-08 DIAGNOSIS — R070 Pain in throat: Secondary | ICD-10-CM | POA: Diagnosis not present

## 2022-05-08 DIAGNOSIS — R1310 Dysphagia, unspecified: Secondary | ICD-10-CM | POA: Diagnosis not present

## 2022-05-08 NOTE — Progress Notes (Signed)
Katrinka Blazing, M.D. Gastroenterology & Hepatology [facility] [address] [place], [place] [address]  Primary Care Physician: Kerri Perches, MD [address] [place] [address]  I will communicate my assessment and recommendations to the referring MD via EMR.  Problems: Chronic throat discomfort - ?globus sensation Intermittent dysphagia  History of Present Illness: Gina Nelson is a 72 y.o. female with past medical history of arthritis, right breast cancer, chronic neck discomfort, who presents for follow up of throat discomfort and intermittent dysphagia.  Patient reports that for multiple years ( at least 10 years) she has had persistent sensation of "something being caught in my throat". She reports that it may aggravate when she swallows. She reports feeling very concerned as it has felt the symptoms are more frequent. Particularly, when she swallows pills, she feels the pills get stuck in her throat. Sometimes she makes herself throw up to relief her symptom.  Patient reports that she was taking a PPI in the past, as advised per Dr. Karilyn Cota but she did not feel any improvement.  Had an US of the neck ordered by Dr. Lodema Hong, which showed a thyroid nodule but no compression was found in her neck.  The patient denies having any fever, chills, hematochezia, melena, hematemesis, abdominal distention, abdominal pain, diarrhea, jaundice, pruritus. Gained some pounds recently.  Last EGD: 12/12/2016 Regular Z-line at 39 cm, edema, erythema and granularity in the pylorus (normal gastric mucosa, negative for dysplasia or H. 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  Last Colonoscopy: 04/25/2021 Diminutive polyp in the proximal transverse colon (hyperplastic polyp) diverticulosis, external hemorrhoids. Advised to not repeat colonoscopy given age  Past Medical History: Past Medical History:  Diagnosis Date    Allergy    Arthritis    knee   Blood transfusion    Cancer    right breast T1cN2   GERD (gastroesophageal reflux disease)    PONV (postoperative nausea and vomiting)     Past Surgical History: Past Surgical History:  Procedure Laterality Date   ABDOMINAL HYSTERECTOMY  1984   BIOPSY  12/12/2016   Procedure: BIOPSY;  Surgeon: Malissa Hippo, MD;  [facility];  Service: Endoscopy;;  gastric   BIOPSY  04/25/2021   Procedure: BIOPSY;  Surgeon: Malissa Hippo, MD;  [facility];  Service: Endoscopy;;   BREAST SURGERY  right MRM, left simple mastectomy   2012   BREAST SURGERY  left mastectomy   2012   COLONOSCOPY  05/07/2011   Procedure: COLONOSCOPY;  Surgeon: Malissa Hippo, MD;  [facility];  Service: Endoscopy;  Laterality: N/A;  830   COLONOSCOPY WITH PROPOFOL N/A 04/25/2021   Procedure: COLONOSCOPY WITH PROPOFOL;  Surgeon: Malissa Hippo, MD;  [facility];  Service: Endoscopy;  Laterality: N/A;  10:00   ESOPHAGOGASTRODUODENOSCOPY N/A 12/12/2016   Procedure: ESOPHAGOGASTRODUODENOSCOPY (EGD);  Surgeon: Malissa Hippo, MD;  [facility];  Service: Endoscopy;  Laterality: N/A;  9:15   JOINT REPLACEMENT  11/2009   right knee   POLYPECTOMY  04/25/2021   Procedure: POLYPECTOMY;  Surgeon: Malissa Hippo, MD;  [facility];  Service: Endoscopy;;   PORT-A-CATH REMOVAL  07/16/10   TOTAL KNEE ARTHROPLASTY  2011    Family History: Family History  Problem Relation Age of Onset   Prostate cancer Father    Pancreatic cancer Sister    Pneumonia Mother  Hypertension Daughter     Social History: Social History   Tobacco Use  Smoking Status Never  Smokeless Tobacco Never   Social History   Substance and Sexual Activity  Alcohol Use No   Social History   Substance and Sexual Activity  Drug Use No    Allergies: No Active Allergies  Medications: Current Outpatient Medications  Medication Sig  Dispense Refill   betamethasone dipropionate 0.05 % cream APPLY A FINGER TIP AMOUNT TWICE WEEKLY AS NEEDED FOR DISCOMFORT 45 g 0   bisacodyl (DULCOLAX) 5 MG EC tablet Take 5 mg by mouth daily as needed for moderate constipation.     diphenhydrAMINE-zinc acetate (BENADRYL EXTRA STRENGTH) cream Apply 1 Application topically 3 (three) times daily as needed for itching. 28.4 g 0   loratadine (CLARITIN) 10 MG tablet Take 1 tablet (10 mg total) by mouth daily as needed for allergies. 30 tablet 3   meclizine (ANTIVERT) 12.5 MG tablet TAKE 1 TABLET BY MOUTH THREE TIMES DAILY AS NEEDED FOR DIZZINESS 21 tablet 0   OVER THE COUNTER MEDICATION Vit D once per day.     pantoprazole (PROTONIX) 40 MG tablet Take 40 mg by mouth daily as needed (acid reflux). (Patient not taking: Reported on 05/08/2022)     No current facility-administered medications for this visit.    Review of Systems: GENERAL: negative for malaise, night sweats HEENT: No changes in hearing or vision, no nose bleeds or other nasal problems. NECK: Negative for lumps, goiter, pain and significant neck swelling RESPIRATORY: Negative for cough, wheezing CARDIOVASCULAR: Negative for chest pain, leg swelling, palpitations, orthopnea GI: SEE HPI MUSCULOSKELETAL: Negative for joint pain or swelling, back pain, and muscle pain. SKIN: Negative for lesions, rash PSYCH: Negative for sleep disturbance, mood disorder and recent psychosocial stressors. HEMATOLOGY Negative for prolonged bleeding, bruising easily, and swollen nodes. ENDOCRINE: Negative for cold or heat intolerance, polyuria, polydipsia and goiter. NEURO: negative for tremor, gait imbalance, syncope and seizures. The remainder of the review of systems is noncontributory.   Physical Exam: BP 128/80 (BP Location: Left Arm, Patient Position: Sitting, Cuff Size: Large)   Pulse 73   Temp (!) 97.1 F (36.2 C) (Temporal)   Ht  (1.651 m)   Wt 174 lb 3.2 oz (79 kg)   BMI 28.99 kg/m   GENERAL: The patient is AO x3, in no acute distress. HEENT: Head is normocephalic and atraumatic. EOMI are intact. Mouth is well hydrated and without lesions. NECK: Supple. No masses or LAD. LUNGS: Clear to auscultation. No presence of rhonchi/wheezing/rales. Adequate chest expansion HEART: RRR, normal s1 and s2. ABDOMEN: Soft, nontender, no guarding, no peritoneal signs, and nondistended. BS +. No masses. EXTREMITIES: Without any cyanosis, clubbing, rash, lesions or edema. NEUROLOGIC: AOx3, no focal motor deficit. SKIN: no jaundice, no rashes  Imaging/Labs: as above  I personally reviewed and interpreted the available labs, imaging and endoscopic files.  Impression and Plan: Gina Nelson is a 72 y.o. female with past medical history of arthritis, right breast cancer, chronic neck discomfort, who presents for follow up of throat discomfort and intermittent dysphagia.  Patient had chronic symptoms of throat discomfort for at least a decade without any associated red flag signs.  Had previous endoscopic evaluation that did not show any abnormalities in her upper gastrointestinal tract.  We discussed that there is possible she is presenting with globus sensation.  However given the fact that her last EGD was performed more than 3 years ago, we will evaluate this  further with an EGD; as she is also presenting some pill dysphagia, we may proceed with dilation at that time.  If her endoscopic evaluation is negative, may proceed with an esophagram.  We briefly discussed the possibility of performing an esophageal manometry.  The patient reported that she had this done before but no reports are available.  She would like to avoid to repeating this unless history necessary.  - Schedule EGD with ED - If negative EGD, will proceed with esophagram  All questions were answered.      Katrinka Blazing, MD Gastroenterology and Hepatology [facility]astro[facility]

## 2022-05-08 NOTE — Patient Instructions (Signed)
Schedule EGD with ED If negative EGD, will proceed with esophagram

## 2022-05-22 ENCOUNTER — Other Ambulatory Visit: Payer: Self-pay | Admitting: Family Medicine

## 2022-05-26 ENCOUNTER — Telehealth (INDEPENDENT_AMBULATORY_CARE_PROVIDER_SITE_OTHER): Payer: Self-pay | Admitting: Gastroenterology

## 2022-05-26 NOTE — Telephone Encounter (Signed)
Left detailed message on voicemail informing pt of pre op appt. Pre op scheduled for 06/23/22 at 9:30 am.

## 2022-06-23 ENCOUNTER — Encounter (HOSPITAL_COMMUNITY): Payer: Self-pay

## 2022-06-23 ENCOUNTER — Encounter (HOSPITAL_COMMUNITY)
Admission: RE | Admit: 2022-06-23 | Discharge: 2022-06-23 | Disposition: A | Discharge status: Home / Self Care | Payer: Medicare PPO | Source: Ambulatory Visit | Attending: Gastroenterology | Admitting: Gastroenterology

## 2022-06-23 NOTE — Patient Instructions (Signed)
Gina Nelson  06/23/2022     @PREFPERIOPPHARMACY @   Your procedure is scheduled on  06/24/2022.   Report to [facility] at  0715 A.M.   Call this number if you have problems the morning of surgery:  [phone]  If you experience any cold or flu symptoms such as cough, fever, chills, shortness of breath, etc. between now and your scheduled surgery, please notify us at the above number.   Remember:  Follow the diet instructions given to you by the office.    Take these medicines the morning of surgery with A SIP OF WATER  claritin, antivert (if needed).    Do not wear jewelry, make-up or nail polish, including gel polish,  artificial nails, or any other type of covering on natural nails (fingers and  toes).  Do not wear lotions, powders, or perfumes, or deodorant.  Do not shave 48 hours prior to surgery.  Men may shave face and neck.  Do not bring valuables to the hospital.  [facility] is not responsible for any belongings or valuables.  Contacts, dentures or bridgework may not be worn into surgery.  Leave your suitcase in the car.  After surgery it may be brought to your room.  For patients admitted to the hospital, discharge time will be determined by your treatment team.  Patients discharged the day of surgery will not be allowed to drive home and must have someone with them for24 hours.    Special instructions:   DO NOT smoke tobacco or vape for 24 hours before your procedure.  Please read over the following fact sheets that you were given. Anesthesia Post-op Instructions and Care and Recovery After Surgery       Upper Endoscopy, Adult, Care After After the procedure, it is common to have a sore throat. It is also common to have: Mild stomach pain or discomfort. Bloating. Nausea. Follow these instructions at home: The instructions below may help you care for yourself at home. Your health care provider may give you more instructions. If you have  questions, ask your health care provider. If you were given a sedative during the procedure, it can affect you for several hours. Do not drive or operate machinery until your health care provider says that it is safe. If you will be going home right after the procedure, plan to have a responsible adult: Take you home from the hospital or clinic. You will not be allowed to drive. Care for you for the time you are told. Follow instructions from your health care provider about what you may eat and drink. Return to your normal activities as told by your health care provider. Ask your health care provider what activities are safe for you. Take over-the-counter and prescription medicines only as told by your health care provider. Contact a health care provider if you: Have a sore throat that lasts longer than one day. Have trouble swallowing. Have a fever. Get help right away if you: Vomit blood or your vomit looks like coffee grounds. Have bloody, black, or tarry stools. Have a very bad sore throat or you cannot swallow. Have difficulty breathing or very bad pain in your chest or abdomen. These symptoms may be an emergency. Get help right away. Call 911. Do not wait to see if the symptoms will go away. Do not drive yourself to the hospital. Summary After the procedure, it is common to have a sore throat, mild stomach discomfort,  bloating, and nausea. If you were given a sedative during the procedure, it can affect you for several hours. Do not drive until your health care provider says that it is safe. Follow instructions from your health care provider about what you may eat and drink. Return to your normal activities as told by your health care provider. This information is not intended to replace advice given to you by your health care provider. Make sure you discuss any questions you have with your health care provider. Document Revised: 04/10/2021 Document Reviewed: 04/10/2021 Elsevier  Patient Education  2024 [employer] Esophageal Dilatation Esophageal dilatation, also called esophageal dilation, is a procedure to widen or open a blocked or narrowed part of the esophagus. The esophagus is the part of the body that moves food and liquid from the mouth to the stomach. You may need this procedure if: You have a buildup of scar tissue in your esophagus that makes it difficult, painful, or impossible to swallow. This can be caused by gastroesophageal reflux disease (GERD). You have cancer of the esophagus. There is a problem with how food moves through your esophagus. In some cases, you may need this procedure repeated at a later time to dilate the esophagus gradually. Tell a health care provider about: Any allergies you have. All medicines you are taking, including vitamins, herbs, eye drops, creams, and over-the-counter medicines. Any problems you or family members have had with anesthetic medicines. Any blood disorders you have. Any surgeries you have had. Any medical conditions you have. Any antibiotic medicines you are required to take before dental procedures. Whether you are pregnant or may be pregnant. What are the risks? Generally, this is a safe procedure. However, problems may occur, including: Bleeding due to a tear in the lining of the esophagus. A hole, or perforation, in the esophagus. What happens before the procedure? Ask your health care provider about: Changing or stopping your regular medicines. This is especially important if you are taking diabetes medicines or blood thinners. Taking medicines such as aspirin and ibuprofen. These medicines can thin your blood. Do not take these medicines unless your health care provider tells you to take them. Taking over-the-counter medicines, vitamins, herbs, and supplements. Follow instructions from your health care provider about eating or drinking restrictions. Plan to have a responsible adult take you home from  the hospital or clinic. Plan to have a responsible adult care for you for the time you are told after you leave the hospital or clinic. This is important. What happens during the procedure? You may be given a medicine to help you relax (sedative). A numbing medicine may be sprayed into the back of your throat, or you may gargle the medicine. Your health care provider may perform the dilatation using various surgical instruments, such as: Simple dilators. This instrument is carefully placed in the esophagus to stretch it. Guided wire bougies. This involves using an endoscope to insert a wire into the esophagus. A dilator is passed over this wire to enlarge the esophagus. Then the wire is removed. Balloon dilators. An endoscope with a small balloon is inserted into the esophagus. The balloon is inflated to stretch the esophagus and open it up. The procedure may vary among health care providers and hospitals. What can I expect after the procedure? Your blood pressure, heart rate, breathing rate, and blood oxygen level will be monitored until you leave the hospital or clinic. Your throat may feel slightly sore and numb. This will get better over time.  You will not be allowed to eat or drink until your throat is no longer numb. When you are able to drink, urinate, and sit on the edge of the bed without nausea or dizziness, you may be able to return home. Follow these instructions at home: Take over-the-counter and prescription medicines only as told by your health care provider. If you were given a sedative during the procedure, it can affect you for several hours. Do not drive or operate machinery until your health care provider says that it is safe. Plan to have a responsible adult care for you for the time you are told. This is important. Follow instructions from your health care provider about any eating or drinking restrictions. Do not use any products that contain nicotine or tobacco, such as  cigarettes, e-cigarettes, and chewing tobacco. If you need help quitting, ask your health care provider. Keep all follow-up visits. This is important. Contact a health care provider if: You have a fever. You have pain that is not relieved by medicine. Get help right away if: You have chest pain. You have trouble breathing. You have trouble swallowing. You vomit blood. You have black, tarry, or bloody stools. These symptoms may represent a serious problem that is an emergency. Do not wait to see if the symptoms will go away. Get medical help right away. Call your local emergency services (911 in the [place] Do not drive yourself to the hospital. Summary Esophageal dilatation, also called esophageal dilation, is a procedure to widen or open a blocked or narrowed part of the esophagus. Plan to have a responsible adult take you home from the hospital or clinic. For this procedure, a numbing medicine may be sprayed into the back of your throat, or you may gargle the medicine. Do not drive or operate machinery until your health care provider says that it is safe. This information is not intended to replace advice given to you by your health care provider. Make sure you discuss any questions you have with your health care provider. Document Revised: 05/18/2019 Document Reviewed: 05/18/2019 Elsevier Patient Education  2024 Elsevier Inc. Monitored Anesthesia Care, Care After The following information offers guidance on how to care for yourself after your procedure. Your health care provider may also give you more specific instructions. If you have problems or questions, contact your health care provider. What can I expect after the procedure? After the procedure, it is common to have: Tiredness. Little or no memory about what happened during or after the procedure. Impaired judgment when it comes to making decisions. Nausea or vomiting. Some trouble with balance. Follow these instructions at  home: For the time period you were told by your health care provider:  Rest. Do not participate in activities where you could fall or become injured. Do not drive or use machinery. Do not drink alcohol. Do not take sleeping pills or medicines that cause drowsiness. Do not make important decisions or sign legal documents. Do not take care of children on your own. Medicines Take over-the-counter and prescription medicines only as told by your health care provider. If you were prescribed antibiotics, take them as told by your health care provider. Do not stop using the antibiotic even if you start to feel better. Eating and drinking Follow instructions from your health care provider about what you may eat and drink. Drink enough fluid to keep your urine pale yellow. If you vomit: Drink clear fluids slowly and in small amounts as you are able. Clear fluids  include water, ice chips, low-calorie sports drinks, and fruit juice that has water added to it (diluted fruit juice). Eat light and bland foods in small amounts as you are able. These foods include bananas, applesauce, rice, lean meats, toast, and crackers. General instructions  Have a responsible adult stay with you for the time you are told. It is important to have someone help care for you until you are awake and alert. If you have sleep apnea, surgery and some medicines can increase your risk for breathing problems. Follow instructions from your health care provider about wearing your sleep device: When you are sleeping. This includes during daytime naps. While taking prescription pain medicines, sleeping medicines, or medicines that make you drowsy. Do not use any products that contain nicotine or tobacco. These products include cigarettes, chewing tobacco, and vaping devices, such as e-cigarettes. If you need help quitting, ask your health care provider. Contact a health care provider if: You feel nauseous or vomit every time you eat  or drink. You feel light-headed. You are still sleepy or having trouble with balance after 24 hours. You get a rash. You have a fever. You have redness or swelling around the IV site. Get help right away if: You have trouble breathing. You have new confusion after you get home. These symptoms may be an emergency. Get help right away. Call 911. Do not wait to see if the symptoms will go away. Do not drive yourself to the hospital. This information is not intended to replace advice given to you by your health care provider. Make sure you discuss any questions you have with your health care provider. Document Revised: 05/27/2021 Document Reviewed: 05/27/2021 Elsevier Patient Education  2024 [employer].

## 2022-06-24 ENCOUNTER — Encounter (HOSPITAL_COMMUNITY): Payer: Self-pay | Admitting: Gastroenterology

## 2022-06-24 ENCOUNTER — Ambulatory Visit (HOSPITAL_COMMUNITY): Payer: Medicare PPO | Admitting: Certified Registered Nurse Anesthetist

## 2022-06-24 ENCOUNTER — Ambulatory Visit (HOSPITAL_BASED_OUTPATIENT_CLINIC_OR_DEPARTMENT_OTHER): Payer: Medicare PPO | Admitting: Certified Registered Nurse Anesthetist

## 2022-06-24 ENCOUNTER — Ambulatory Visit (HOSPITAL_COMMUNITY)
Admission: RE | Admit: 2022-06-24 | Discharge: 2022-06-24 | Disposition: A | Discharge status: Home / Self Care | Payer: Medicare PPO | Source: Ambulatory Visit | Attending: Gastroenterology | Admitting: Gastroenterology

## 2022-06-24 ENCOUNTER — Encounter (HOSPITAL_COMMUNITY)
Admission: RE | Disposition: A | Discharge status: Home / Self Care | Payer: Self-pay | Source: Ambulatory Visit | Attending: Gastroenterology

## 2022-06-24 DIAGNOSIS — Z79899 Other long term (current) drug therapy: Secondary | ICD-10-CM | POA: Diagnosis not present

## 2022-06-24 DIAGNOSIS — R0989 Other specified symptoms and signs involving the circulatory and respiratory systems: Secondary | ICD-10-CM | POA: Diagnosis not present

## 2022-06-24 DIAGNOSIS — Z853 Personal history of malignant neoplasm of breast: Secondary | ICD-10-CM | POA: Insufficient documentation

## 2022-06-24 DIAGNOSIS — R1310 Dysphagia, unspecified: Secondary | ICD-10-CM | POA: Insufficient documentation

## 2022-06-24 DIAGNOSIS — K219 Gastro-esophageal reflux disease without esophagitis: Secondary | ICD-10-CM | POA: Diagnosis not present

## 2022-06-24 DIAGNOSIS — M1990 Unspecified osteoarthritis, unspecified site: Secondary | ICD-10-CM | POA: Diagnosis not present

## 2022-06-24 DIAGNOSIS — K449 Diaphragmatic hernia without obstruction or gangrene: Secondary | ICD-10-CM | POA: Insufficient documentation

## 2022-06-24 DIAGNOSIS — R09A2 Foreign body sensation, throat: Secondary | ICD-10-CM | POA: Insufficient documentation

## 2022-06-24 DIAGNOSIS — Z08 Encounter for follow-up examination after completed treatment for malignant neoplasm: Secondary | ICD-10-CM | POA: Diagnosis not present

## 2022-06-24 DIAGNOSIS — R070 Pain in throat: Secondary | ICD-10-CM | POA: Diagnosis not present

## 2022-06-24 DIAGNOSIS — K2289 Other specified disease of esophagus: Secondary | ICD-10-CM | POA: Insufficient documentation

## 2022-06-24 DIAGNOSIS — C50911 Malignant neoplasm of unspecified site of right female breast: Secondary | ICD-10-CM

## 2022-06-24 HISTORY — PX: ESOPHAGOGASTRODUODENOSCOPY (EGD) WITH PROPOFOL: SHX5813

## 2022-06-24 HISTORY — PX: ESOPHAGEAL DILATION: SHX303

## 2022-06-24 HISTORY — PX: BIOPSY: SHX5522

## 2022-06-24 SURGERY — ESOPHAGOGASTRODUODENOSCOPY (EGD) WITH PROPOFOL
Anesthesia: General

## 2022-06-24 MED ORDER — LIDOCAINE HCL (CARDIAC) PF 100 MG/5ML IV SOSY
PREFILLED_SYRINGE | INTRAVENOUS | Status: DC | PRN
  Administered 2022-06-24: 50 mg via INTRAVENOUS

## 2022-06-24 MED ORDER — PROPOFOL 10 MG/ML IV BOLUS
INTRAVENOUS | Status: DC | PRN
  Administered 2022-06-24 (×2): 30 mg via INTRAVENOUS
  Administered 2022-06-24: 80 mg via INTRAVENOUS

## 2022-06-24 MED ORDER — LACTATED RINGERS IV SOLN: INTRAVENOUS | Status: DC

## 2022-06-24 MED ORDER — AMITRIPTYLINE HCL 10 MG PO TABS: 10.0000 mg | ORAL_TABLET | Freq: Every day | ORAL | 1 refills | Status: DC

## 2022-06-24 NOTE — H&P (Signed)
Gina Nelson is an 72 y.o. female.   Chief Complaint: throat discomfort and pill dysphagia. HPI: Gina Nelson is a 72 y.o. female with past medical history of arthritis, right breast cancer, chronic neck discomfort, who presents for follow up of throat discomfort and intermittent dysphagia.   Patient reports recurrent dysphagia with each meal but also she reports that she has p[persistent discomfort in her throat even in the morning without eating.  Past Medical History:  Diagnosis Date   Allergy    Arthritis    knee   Blood transfusion    Cancer (HCC)    right breast T1cN2   GERD (gastroesophageal reflux disease)    PONV (postoperative nausea and vomiting)     Past Surgical History:  Procedure Laterality Date   ABDOMINAL HYSTERECTOMY  1984   BIOPSY  12/12/2016   Procedure: BIOPSY;  Surgeon: Malissa Hippo, MD;  Location: [facility];  Service: Endoscopy;;  gastric   BIOPSY  04/25/2021   Procedure: BIOPSY;  Surgeon: Malissa Hippo, MD;  Location: [facility];  Service: Endoscopy;;   BREAST SURGERY  right MRM, left simple mastectomy   2012   BREAST SURGERY  left mastectomy   2012   COLONOSCOPY  05/07/2011   Procedure: COLONOSCOPY;  Surgeon: Malissa Hippo, MD;  Location: [facility];  Service: Endoscopy;  Laterality: N/A;  830   COLONOSCOPY WITH PROPOFOL N/A 04/25/2021   Procedure: COLONOSCOPY WITH PROPOFOL;  Surgeon: Malissa Hippo, MD;  Location: [facility];  Service: Endoscopy;  Laterality: N/A;  10:00   ESOPHAGOGASTRODUODENOSCOPY N/A 12/12/2016   Procedure: ESOPHAGOGASTRODUODENOSCOPY (EGD);  Surgeon: Malissa Hippo, MD;  Location: [facility];  Service: Endoscopy;  Laterality: N/A;  9:15   JOINT REPLACEMENT  11/2009   right knee   POLYPECTOMY  04/25/2021   Procedure: POLYPECTOMY;  Surgeon: Malissa Hippo, MD;  Location: [facility];  Service: Endoscopy;;   PORT-A-CATH REMOVAL  07/16/10   TOTAL KNEE ARTHROPLASTY  2011    Family History  Problem  Relation Age of Onset   Prostate cancer Father    Pancreatic cancer Sister    Pneumonia Mother    Hypertension Daughter    Social History:  reports that she has never smoked. She has never used smokeless tobacco. She reports that she does not drink alcohol and does not use drugs.  Allergies: No Known Allergies  Medications Prior to Admission  Medication Sig Dispense Refill   betamethasone dipropionate 0.05 % cream APPLY A FINGER TIP AMOUNT TWICE WEEKLY AS NEEDED FOR DISCOMFORT. 45 g 0   bisacodyl (DULCOLAX) 5 MG EC tablet Take 5 mg by mouth daily as needed for moderate constipation.     cholecalciferol (VITAMIN D3) 25 MCG (1000 UNIT) tablet Take 1,000 Units by mouth 4 (four) times a week.     clotrimazole-betamethasone (LOTRISONE) cream Apply 1 Application topically 2 (two) times daily as needed (irritation behind ears due to eye glasses).     loratadine (CLARITIN) 10 MG tablet Take 10 mg by mouth daily as needed for allergies.     meclizine (ANTIVERT) 12.5 MG tablet TAKE 1 TABLET BY MOUTH THREE TIMES DAILY AS NEEDED FOR DIZZINESS 21 tablet 0    No results found for this or any previous visit (from the past 48 hour(s)). No results found.  Review of Systems  HENT:  Positive for trouble swallowing.   All other systems reviewed and are negative.   Blood pressure (!) 160/55,  pulse (!) 49, temperature 97.6 F (36.4 C), temperature source Oral, resp. rate 18, SpO2 100 %. Physical Exam  GENERAL: The patient is AO x3, in no acute distress. HEENT: Head is normocephalic and atraumatic. EOMI are intact. Mouth is well hydrated and without lesions. NECK: Supple. No masses LUNGS: Clear to auscultation. No presence of rhonchi/wheezing/rales. Adequate chest expansion HEART: RRR, normal s1 and s2. ABDOMEN: Soft, nontender, no guarding, no peritoneal signs, and nondistended. BS +. No masses. EXTREMITIES: Without any cyanosis, clubbing, rash, lesions or edema. NEUROLOGIC: AOx3, no focal motor  deficit. SKIN: no jaundice, no rashes  Assessment/Plan Gina Nelson is a 72 y.o. female with past medical history of arthritis, right breast cancer, chronic neck discomfort, who presents for follow up of throat discomfort and intermittent dysphagia. Will proceed with EGD.  Dolores Frame, MD 06/24/2022, 8:39 AM

## 2022-06-24 NOTE — Transfer of Care (Signed)
Immediate Anesthesia Transfer of Care Note  Patient: Gina Nelson  Procedure(s) Performed: ESOPHAGOGASTRODUODENOSCOPY (EGD) WITH PROPOFOL ESOPHAGEAL DILATION BIOPSY  Patient Location: Short Stay  Anesthesia Type:General  Level of Consciousness: drowsy  Airway & Oxygen Therapy: Patient Spontanous Breathing  Post-op Assessment: Report given to RN and Post -op Vital signs reviewed and stable  Post vital signs: Reviewed and stable  Last Vitals:  Vitals Value Taken Time  BP 139/59   Temp 36.7 C 06/24/22 0940  Pulse 60 06/24/22 0940  Resp 15 06/24/22 0940  SpO2 98 % 06/24/22 0940    Last Pain:  Vitals:   06/24/22 0940  TempSrc: Axillary  PainSc: Asleep      Patients Stated Pain Goal: 6 (06/24/22 0752)  Complications: No notable events documented.

## 2022-06-24 NOTE — Op Note (Signed)
[facility] Patient Name: Gina Nelson Procedure Date: 06/24/2022 9:17 AM MRN: [id number] Date of Birth: Oct 15, 1950 Attending MD: Katrinka Blazing , , [id number] CSN: [id number] Age: 72 Admit Type: Outpatient Procedure:                Upper GI endoscopy Indications:              Dysphagia, Globus sensation Providers:                Katrinka Blazing, Angelica Ran, Zena Amos Referring MD:              Medicines:                Monitored Anesthesia Care Complications:            No immediate complications. Estimated Blood Loss:     Estimated blood loss: none. Procedure:                Pre-Anesthesia Assessment:                           - Prior to the procedure, a History and Physical                            was performed, and patient medications, allergies                            and sensitivities were reviewed. The patient's                            tolerance of previous anesthesia was reviewed.                           - The risks and benefits of the procedure and the                            sedation options and risks were discussed with the                            patient. All questions were answered and informed                            consent was obtained.                           - ASA Grade Assessment: II - A patient with mild                            systemic disease.                           After obtaining informed consent, the endoscope was                            passed under direct vision. Throughout the                            procedure, the patient's blood pressure,  pulse, and                            oxygen saturations were monitored continuously. The                            [id number]-H190 ([id number]) scope was introduced through the                            mouth, and advanced to the second part of duodenum.                            The upper GI endoscopy was accomplished without                            difficulty. The patient  tolerated the procedure                            well. Scope In: 9:29:06 AM Scope Out: 9:36:33 AM Total Procedure Duration: 0 hours 7 minutes 27 seconds  Findings:      No endoscopic abnormality was evident in the esophagus to explain the       patient's complaint of dysphagia. It was decided, however, to proceed       with dilation of the entire esophagus. A guidewire was placed and the       scope was withdrawn. Dilation was performed with a Savary dilator with       no resistance at 18 mm. The dilation site was examined following       endoscope reinsertion and showed no change.      The Z-line was irregular and was found 37 cm from the incisors. Biopsies       were taken with a cold forceps for histology.      A 2 cm hiatal hernia was present.      The gastroesophageal flap valve was visualized endoscopically and       classified as Hill Grade II (fold present, opens with respiration).      The stomach was normal.      The examined duodenum was normal. Impression:               - No endoscopic esophageal abnormality to explain                            patient's dysphagia. Esophagus dilated. Dilated.                           - Z-line irregular, 37 cm from the incisors.                            Biopsied.                           - 2 cm hiatal hernia.                           - Normal stomach.                           -  Normal examined duodenum. Moderate Sedation:      Per Anesthesia Care Recommendation:           - Discharge patient to home (ambulatory).                           - Resume previous diet.                           - Await pathology results.                           - Start Elavil 10 mg at bedtime. Procedure Code(s):        --- Professional ---                           [id number], Esophagogastroduodenoscopy, flexible,                            transoral; with insertion of guide wire followed by                            passage of dilator(s) through  esophagus over guide                            wire                           43239, 59, Esophagogastroduodenoscopy, flexible,                            transoral; with biopsy, single or multiple Diagnosis Code(s):        --- Professional ---                           R13.10, Dysphagia, unspecified                           K22.89, Other specified disease of esophagus                           K44.9, Diaphragmatic hernia without obstruction or                            gangrene                           F45.8, Other somatoform disorders CPT copyright 2022 American Medical Association. All rights reserved. The codes documented in this report are preliminary and upon coder review may  be revised to meet current compliance requirements. Katrinka Blazing, MD Katrinka Blazing,  06/24/2022 9:41:48 AM This report has been signed electronically. Number of Addenda: 0

## 2022-06-24 NOTE — Anesthesia Preprocedure Evaluation (Signed)
Anesthesia Evaluation  Patient identified by MRN, date of birth, ID band Patient awake    Reviewed: Allergy & Precautions, H&P , NPO status , Patient's Chart, lab work & pertinent test results  History of Anesthesia Complications (+) PONV and history of anesthetic complications  Airway Mallampati: II  TM Distance: >3 FB Neck ROM: Full    Dental  (+) Dental Advisory Given, Missing   Pulmonary neg pulmonary ROS   Pulmonary exam normal breath sounds clear to auscultation       Cardiovascular negative cardio ROS Normal cardiovascular exam Rhythm:Regular Rate:Normal     Neuro/Psych negative neurological ROS  negative psych ROS   GI/Hepatic Neg liver ROS,GERD  Medicated and Controlled,,  Endo/Other  negative endocrine ROS    Renal/GU negative Renal ROS  negative genitourinary   Musculoskeletal  (+) Arthritis , Osteoarthritis,    Abdominal   Peds negative pediatric ROS (+)  Hematology negative hematology ROS (+)   Anesthesia Other Findings Right breast cancer  Reproductive/Obstetrics negative OB ROS                             Anesthesia Physical Anesthesia Plan  ASA: 2  Anesthesia Plan: General   Post-op Pain Management: Minimal or no pain anticipated   Induction: Intravenous  PONV Risk Score and Plan:   Airway Management Planned: Nasal Cannula and Natural Airway  Additional Equipment:   Intra-op Plan:   Post-operative Plan:   Informed Consent: I have reviewed the patients History and Physical, chart, labs and discussed the procedure including the risks, benefits and alternatives for the proposed anesthesia with the patient or authorized representative who has indicated his/her understanding and acceptance.     Dental advisory given  Plan Discussed with: CRNA and Surgeon  Anesthesia Plan Comments:         Anesthesia Quick Evaluation

## 2022-06-24 NOTE — Anesthesia Postprocedure Evaluation (Signed)
Anesthesia Post Note  Patient: Gina Nelson  Procedure(s) Performed: ESOPHAGOGASTRODUODENOSCOPY (EGD) WITH PROPOFOL ESOPHAGEAL DILATION BIOPSY  Patient location during evaluation: Phase II Anesthesia Type: General Level of consciousness: awake and alert and oriented Pain management: pain level controlled Vital Signs Assessment: post-procedure vital signs reviewed and stable Respiratory status: spontaneous breathing, nonlabored ventilation and respiratory function stable Cardiovascular status: blood pressure returned to baseline and stable Postop Assessment: no apparent nausea or vomiting Anesthetic complications: no  No notable events documented.   Last Vitals:  Vitals:   06/24/22 0752 06/24/22 0940  BP: (!) 160/55 (!) 139/59  Pulse: (!) 49 60  Resp: 18 15  Temp: 36.4 C 36.7 C  SpO2: 100% 98%    Last Pain:  Vitals:   06/24/22 0940  TempSrc: Axillary  PainSc: Asleep                 [name]

## 2022-06-24 NOTE — Discharge Instructions (Signed)
You are being discharged to home.  Resume your previous diet.  We are waiting for your pathology results.  Start Elavil 10 mg at bedtime.

## 2022-06-25 LAB — SURGICAL PATHOLOGY

## 2022-07-01 ENCOUNTER — Encounter (HOSPITAL_COMMUNITY): Payer: Self-pay | Admitting: Gastroenterology

## 2022-07-22 ENCOUNTER — Ambulatory Visit: Payer: Medicare PPO | Admitting: Internal Medicine

## 2022-07-22 ENCOUNTER — Encounter: Payer: Self-pay | Admitting: Internal Medicine

## 2022-07-22 VITALS — BP 104/68 | HR 77 | Ht 65.0 in | Wt 166.0 lb

## 2022-07-22 DIAGNOSIS — R051 Acute cough: Secondary | ICD-10-CM

## 2022-07-22 MED ORDER — HYDROCODONE BIT-HOMATROP MBR 5-1.5 MG/5ML PO SOLN: 5.0000 mL | Freq: Four times a day (QID) | ORAL | 0 refills | Status: DC | PRN

## 2022-07-22 NOTE — Patient Instructions (Signed)
Thank you, Ms.Melainie D Spieth for allowing [place] to provide your care today.   I have sent cough medicine to your pharmacy. Let me know if your symptoms do not improve or worsen.       Thurmon Fair, M.D.

## 2022-07-22 NOTE — Progress Notes (Unsigned)
   HPI:[name] is a 72 y.o. female who presents for evaluation of cough. Patient' cough started Thursday and is  keeping her up at night. She had an endoscopy on 6/11 fpr without a finding to explain her dysphagia, esophageal dilation was performed. She was started on amitriptyline for globus pharyngeus. No trouble swallowing or heartburn.  She has seasonal allergies, but they have been well controlled. No fever, no runny nose, no sinus congestion, and tested negative for covid.   Physical Exam: Vitals:   07/22/22 0917  BP: 104/68  Pulse: 77  SpO2: 98%  Weight: 166 lb (75.3 kg)  Height: 5\' 5"  (1.651 m)     Physical Exam Constitutional:      General: She is not in acute distress.    Appearance: She is not ill-appearing.  HENT:     Right Ear: Tympanic membrane normal.     Left Ear: Tympanic membrane normal.     Nose: No congestion or rhinorrhea.     Mouth/Throat:     Pharynx: No oropharyngeal exudate or posterior oropharyngeal erythema.  Cardiovascular:     Rate and Rhythm: Normal rate and regular rhythm.     Heart sounds: No murmur heard. Pulmonary:     Effort: No respiratory distress.     Breath sounds: No wheezing or rales.  Musculoskeletal:     Right lower leg: No edema.     Left lower leg: No edema.      Assessment & Plan:   Braylynn was seen today for cough.  Acute cough Assessment & Plan: Acute problem, uncomplicated No localizing symptoms. With no clear cause at this time, I am recommending treating cough and monitoring for it to go away.  Follow up if symptoms worsen or fail to improve    Other orders -     HYDROcodone Bit-Homatrop MBr; Take 5 mLs by mouth every 6 (six) hours as needed for cough.  Dispense: 120 mL; Refill: 0      Milus Banister, MD

## 2022-07-24 NOTE — Assessment & Plan Note (Signed)
Acute problem, uncomplicated No localizing symptoms. With no clear cause at this time, I am recommending treating cough and monitoring for it to go away.  Follow up if symptoms worsen or fail to improve

## 2022-08-20 ENCOUNTER — Ambulatory Visit (INDEPENDENT_AMBULATORY_CARE_PROVIDER_SITE_OTHER): Payer: Medicare PPO | Admitting: Family Medicine

## 2022-08-20 ENCOUNTER — Encounter: Payer: Self-pay | Admitting: Family Medicine

## 2022-08-20 VITALS — Ht 65.0 in | Wt 165.0 lb

## 2022-08-20 DIAGNOSIS — Z0000 Encounter for general adult medical examination without abnormal findings: Secondary | ICD-10-CM | POA: Diagnosis not present

## 2022-08-20 NOTE — Progress Notes (Signed)
Subjective:   Gina Nelson is a 72 y.o. female who presents for Medicare Annual (Subsequent) preventive examination.  Visit Complete: Virtual  I connected with  Talbert Forest on 08/20/22 by a audio enabled telemedicine application and verified that I am speaking with the correct person using two identifiers.  Patient Location: Home  Provider Location: Office/Clinic  I discussed the limitations of evaluation and management by telemedicine. The patient expressed understanding and agreed to proceed.  Patient Medicare AWV questionnaire was completed by the patient on 08/20/2022; I have confirmed that all information answered by patient is correct and no changes since this date.  Review of Systems    Patient denies pain, fever, chills, chest pain, palpations ,shortness of breath, blurred vision,cough, abdominal pain, nausea, vomiting, headache, dizziness. Patient is not feeling nervous or anxious.         Objective:    Today's Vitals   08/20/22 0820  Weight: 165 lb (74.8 kg)  Height: 5\' 5"  (1.651 m)   Body mass index is 27.46 kg/m.     08/20/2022    8:24 AM 06/24/2022    7:50 AM 06/23/2022    9:38 AM 08/12/2021   10:12 AM 04/25/2021    8:24 AM 04/23/2021   11:39 AM 08/01/2020    8:53 AM  Advanced Directives  Does Patient Have a Medical Advance Directive? Yes No No No No No No  Type of Estate agent of [place];Living will        Does patient want to make changes to medical advance directive? Yes (Inpatient - patient defers changing a medical advance directive and declines information at this time)        Copy of Healthcare Power of Attorney in Chart? No - copy requested        Would patient like information on creating a medical advance directive?  No - Patient declined No - Patient declined No - Patient declined No - Patient declined No - Patient declined No - Patient declined    Current Medications (verified) Outpatient Encounter Medications as of  08/20/2022  Medication Sig   amitriptyline (ELAVIL) 10 MG tablet Take 1 tablet (10 mg total) by mouth at bedtime.   betamethasone dipropionate 0.05 % cream APPLY A FINGER TIP AMOUNT TWICE WEEKLY AS NEEDED FOR DISCOMFORT.   bisacodyl (DULCOLAX) 5 MG EC tablet Take 5 mg by mouth daily as needed for moderate constipation.   cholecalciferol (VITAMIN D3) 25 MCG (1000 UNIT) tablet Take 1,000 Units by mouth 4 (four) times a week.   clotrimazole-betamethasone (LOTRISONE) cream Apply 1 Application topically 2 (two) times daily as needed (irritation behind ears due to eye glasses).   HYDROcodone bit-homatropine (HYCODAN) 5-1.5 MG/5ML syrup Take 5 mLs by mouth every 6 (six) hours as needed for cough.   loratadine (CLARITIN) 10 MG tablet Take 10 mg by mouth daily as needed for allergies.   meclizine (ANTIVERT) 12.5 MG tablet TAKE 1 TABLET BY MOUTH THREE TIMES DAILY AS NEEDED FOR DIZZINESS   No facility-administered encounter medications on file as of 08/20/2022.    Allergies (verified) Patient has no known allergies.   History: Past Medical History:  Diagnosis Date   Allergy    Arthritis    knee   Blood transfusion    Cancer (HCC)    right breast T1cN2   GERD (gastroesophageal reflux disease)    PONV (postoperative nausea and vomiting)    Past Surgical History:  Procedure Laterality Date   ABDOMINAL HYSTERECTOMY  1984   BIOPSY  12/12/2016   Procedure: BIOPSY;  Surgeon: Malissa Hippo, MD;  [facility];  Service: Endoscopy;;  gastric   BIOPSY  04/25/2021   Procedure: BIOPSY;  Surgeon: Malissa Hippo, MD;  [facility];  Service: Endoscopy;;   BIOPSY  06/24/2022   Procedure: BIOPSY;  Surgeon: Marguerita Merles, Reuel Boom, MD;  [facility];  Service: Gastroenterology;;   BREAST SURGERY  right MRM, left simple mastectomy   2012   BREAST SURGERY  left mastectomy   2012   COLONOSCOPY  05/07/2011   Procedure: COLONOSCOPY;  Surgeon: Malissa Hippo, MD;  [facility];  Service: Endoscopy;  Laterality: N/A;  830   COLONOSCOPY WITH PROPOFOL N/A 04/25/2021   Procedure: COLONOSCOPY WITH PROPOFOL;  Surgeon: Malissa Hippo, MD;  [facility];  Service: Endoscopy;  Laterality: N/A;  10:00   ESOPHAGEAL DILATION N/A 06/24/2022   Procedure: ESOPHAGEAL DILATION;  Surgeon: Dolores Frame, MD;  [facility];  Service: Gastroenterology;  Laterality: N/A;  2:30;asa 3   ESOPHAGOGASTRODUODENOSCOPY N/A 12/12/2016   Procedure: ESOPHAGOGASTRODUODENOSCOPY (EGD);  Surgeon: Malissa Hippo, MD;  [facility];  Service: Endoscopy;  Laterality: N/A;  9:15   ESOPHAGOGASTRODUODENOSCOPY (EGD) WITH PROPOFOL N/A 06/24/2022   Procedure: ESOPHAGOGASTRODUODENOSCOPY (EGD) WITH PROPOFOL;  Surgeon: Dolores Frame, MD;  [facility];  Service: Gastroenterology;  Laterality: N/A;  to arrive at 0715.   JOINT REPLACEMENT  11/2009   right knee   POLYPECTOMY  04/25/2021   Procedure: POLYPECTOMY;  Surgeon: Malissa Hippo, MD;  [facility];  Service: Endoscopy;;   PORT-A-CATH REMOVAL  07/16/10   TOTAL KNEE ARTHROPLASTY  2011   Family History  Problem Relation Age of Onset   Prostate cancer Father    Pancreatic cancer Sister    Pneumonia Mother    Hypertension Daughter    Social History   Socioeconomic History   Marital status: Married    Spouse name: Ivar Drape   Number of children: 1   Years of education: Not on file   Highest education level: 12th grade  Occupational History   Occupation: retired  Tobacco Use   Smoking status: Never   Smokeless tobacco: Never  Vaping Use   Vaping status: Never Used  Substance and Sexual Activity   Alcohol use: No   Drug use: No   Sexual activity: Yes    Birth control/protection: Surgical  Other Topics Concern   Not on file  Social History Narrative   Has been married 48 years. Husband is a Optician, dispensing. She is retired.Visits sick people in the hospital. Enjoys  word search puzzles.   Social Determinants of Health   Financial Resource Strain: Low Risk  (08/20/2022)   Overall Financial Resource Strain (CARDIA)    Difficulty of Paying Living Expenses: Not hard at all  Food Insecurity: No Food Insecurity (08/20/2022)   Hunger Vital Sign    Worried About Running Out of Food in the Last Year: Never true    Ran Out of Food in the Last Year: Never true  Transportation Needs: No Transportation Needs (08/20/2022)   PRAPARE - Administrator, Civil Service (Medical): No    Lack of Transportation (Non-Medical): No  Physical Activity: Sufficiently Active (08/20/2022)   Exercise Vital Sign    Days of Exercise per Week: 5 days    Minutes of Exercise per Session: 30 min  Stress: No Stress  Concern Present (08/20/2022)   [employer] of Occupational Health - Occupational Stress Questionnaire    Feeling of Stress : Not at all  Social Connections: Socially Integrated (08/20/2022)   Social Connection and Isolation Panel [NHANES]    Frequency of Communication with Friends and Family: More than three times a week    Frequency of Social Gatherings with Friends and Family: Once a week    Attends Religious Services: More than 4 times per year    Active Member of [employer] or Organizations: Yes    Attends Engineer, structural: More than 4 times per year    Marital Status: Married    Tobacco Counseling Counseling given: Not Answered   Clinical Intake:                        Activities of Daily Living    06/23/2022    9:39 AM 06/23/2022    9:37 AM  In your present state of health, do you have any difficulty performing the following activities:  Hearing?  0  Vision?  0  Difficulty concentrating or making decisions?  0  Walking or climbing stairs?  0  Dressing or bathing?  0  Doing errands, shopping? 0     Patient Care Team: Kerri Perches, MD as PCP - Rosine Door, MD as Consulting Physician (General  Surgery) Tilda Burrow, MD (Inactive) as Consulting Physician (Obstetrics and Gynecology) Magrinat, Valentino Hue, MD (Inactive) as Consulting Physician (Oncology) Daisy Lazar, DO as Consulting Physician (Optometry) Rehman, Joline Maxcy, MD (Inactive) as Consulting Physician (Gastroenterology)  Indicate any recent Medical Services you may have received from other than [facility] providers in the past year (date may be approximate).     Assessment:   This is a routine wellness examination for Elna.  Hearing/Vision screen No results found.  Dietary issues and exercise activities discussed:    Discussed lifestyle modifications follow diet low in saturated fat, reduce dietary salt intake, avoid fatty foods, maintain an exercise routine 3 to 5 days a week for a minimum total of 150 minutes.   Goals Addressed   None    Depression Screen    08/20/2022    8:23 AM 07/22/2022    9:17 AM 02/18/2022   10:56 AM 01/01/2022   10:00 AM 09/20/2021   10:39 AM 08/12/2021   10:12 AM 08/12/2021   10:10 AM  PHQ 2/9 Scores  PHQ - 2 Score 0 0 0 0 0 0 0    Fall Risk    08/20/2022    8:24 AM 07/22/2022    9:17 AM 02/18/2022   10:56 AM 01/01/2022   10:00 AM 09/20/2021   10:39 AM  Fall Risk   Falls in the past year? 0 0 0 0 0  Number falls in past yr: 0 0 0 0 0  Injury with Fall? 0 0 0 0 0  Risk for fall due to : No Fall Risks    No Fall Risks  Follow up Falls evaluation completed    Falls evaluation completed    MEDICARE RISK AT HOME:   TIMED UP AND GO:  Was the test performed?  No    Cognitive Function:    08/01/2020    8:54 AM  MMSE - Mini Mental State Exam  Not completed: Unable to complete        08/20/2022    8:25 AM 08/12/2021   10:13 AM 08/01/2020    8:54 AM 08/01/2019  8:13 AM 07/20/2018    8:26 AM  6CIT Screen  What Year? 0 points 0 points 0 points 0 points 0 points  What month? 0 points 0 points 0 points 0 points 0 points  What time? 0 points 0 points 0 points 0 points 0 points  Count back  from 20 0 points  0 points 0 points 0 points  Months in reverse 0 points 0 points 0 points 0 points 0 points  Repeat phrase 0 points 0 points 0 points 0 points 0 points  Total Score 0 points  0 points 0 points 0 points    Immunizations Immunization History  Administered Date(s) Administered   Fluad Quad(high Dose 65+) 09/14/2018, 09/30/2019, 09/20/2020, 09/20/2021   Influenza Whole 11/08/2006   Influenza,inj,Quad PF,6+ Mos 09/22/2012, 09/20/2013, 09/12/2014, 11/05/2015, 10/07/2016, 10/01/2017   Moderna Covid-19 Vaccine Bivalent Booster 61yrs & up 10/21/2021   PFIZER Comirnaty(Gray Top)Covid-19 Tri-Sucrose Vaccine 04/30/2020   PFIZER(Purple Top)SARS-COV-2 Vaccination 02/19/2019, 03/12/2019, 10/20/2019   Pfizer Covid-19 Vaccine Bivalent Booster 61yrs & up 11/08/2020, 05/24/2021   Pneumococcal Conjugate-13 04/24/2015   Pneumococcal Polysaccharide-23 04/24/2016   Td 08/06/2005   Tdap 09/27/2021   Zoster Recombinant(Shingrix) 07/27/2020, 10/04/2020   Zoster, Live 02/23/2012    TDAP status: Up to date  Flu Vaccine status: Due, Education has been provided regarding the importance of this vaccine. Advised may receive this vaccine at local pharmacy or Health Dept. Aware to provide a copy of the vaccination record if obtained from local pharmacy or Health Dept. Verbalized acceptance and understanding.  Pneumococcal vaccine status: Up to date  Covid-19 vaccine status: Information provided on how to obtain vaccines.   Qualifies for Shingles Vaccine? Yes   Zostavax completed Yes   Shingrix Completed?: Yes  Screening Tests Health Maintenance  Topic Date Due   COVID-19 Vaccine (8 - 2023-24 season) 12/16/2021   Medicare Annual Wellness (AWV)  08/13/2022   INFLUENZA VACCINE  08/14/2022   DTaP/Tdap/Td (3 - Td or Tdap) 09/28/2031   Pneumonia Vaccine 26+ Years old  Completed   DEXA SCAN  Completed   Hepatitis C Screening  Completed   Zoster Vaccines- Shingrix  Completed   HPV VACCINES   Aged Out   MAMMOGRAM  Discontinued   Colonoscopy  Discontinued    Health Maintenance  Health Maintenance Due  Topic Date Due   COVID-19 Vaccine (8 - 2023-24 season) 12/16/2021   Medicare Annual Wellness (AWV)  08/13/2022   INFLUENZA VACCINE  08/14/2022    Colorectal cancer screening: No longer required.   Mammogram status: No longer required due to AGE.  Bone Density status: Completed 07/2019. Results reflect: Bone density results: NORMAL. Repeat every   years.  Lung Cancer Screening: (Low Dose CT Chest recommended if Age 55-80 years, 20 pack-year currently smoking OR have quit w/in 15years.) does not qualify.   Lung Cancer Screening Referral:  N/A  Additional Screening:  Hepatitis C Screening: does not qualify; Completed 2016 Negative  Vision Screening: Recommended annual ophthalmology exams for early detection of glaucoma and other disorders of the eye. Is the patient up to date with their annual eye exam?  Yes  Who is the provider or what is the name of the office in which the patient attends annual eye exams? [facility] If pt is not established with a provider, would they like to be referred to a provider to establish care? No .   Dental Screening: Recommended annual dental exams for proper oral hygiene  Diabetic Foot Exam: N/A  Community Resource Referral /  Chronic Care Management: CRR required this visit?  No   CCM required this visit?  Appt scheduled with PCP     Plan:     I have personally reviewed and noted the following in the patient's chart:   Medical and social history Use of alcohol, tobacco or illicit drugs  Current medications and supplements including opioid prescriptions. Patient is currently taking opioid prescriptions. Information provided to patient regarding non-opioid alternatives. Patient advised to discuss non-opioid treatment plan with their provider. Functional ability and status Nutritional status Physical activity Advanced  directives List of other physicians Hospitalizations, surgeries, and ER visits in previous 12 months Vitals Screenings to include cognitive, depression, and falls Referrals and appointments  In addition, I have reviewed and discussed with patient certain preventive protocols, quality metrics, and best practice recommendations. A written personalized care plan for preventive services as well as general preventive health recommendations were provided to patient.     Cruzita Lederer Newman Nip, FNP   08/20/2022   After Visit Summary: (Mail) Due to this being a telephonic visit, the after visit summary with patients personalized plan was offered to patient via mail

## 2022-09-22 ENCOUNTER — Telehealth: Payer: Self-pay | Admitting: Family Medicine

## 2022-09-22 NOTE — Telephone Encounter (Signed)
Pt called in regard to billing for service date of 79/24 with Barbaraann Faster   States that she has spoke with [facility] billing and they are unable to t refund co pay until visit is re coded    Patient wants a call back in regard.

## 2022-09-23 NOTE — Telephone Encounter (Signed)
Claim was sent for coding review by Billing department.

## 2022-09-30 NOTE — Telephone Encounter (Signed)
Patient came by the office, she has contacted [facility] and would need to come to our office and re code date of service 07.09.2024, patient seen Dr Barbaraann Faster this date instead of Dr Lodema Hong.

## 2022-10-06 NOTE — Telephone Encounter (Signed)
Claim did not require recoding.  Claim processed with a $40 copay due to patient receiving services from Dr. Barbaraann Faster. [employer] will reprocess with potential resolution in 30-45 business days. Billing will follow up with patient regarding copay.

## 2022-10-07 ENCOUNTER — Encounter: Payer: Self-pay | Admitting: Family Medicine

## 2022-10-07 ENCOUNTER — Ambulatory Visit: Payer: Medicare PPO | Admitting: Family Medicine

## 2022-10-07 VITALS — BP 115/75 | HR 78 | Ht 65.0 in | Wt 163.1 lb

## 2022-10-07 DIAGNOSIS — M170 Bilateral primary osteoarthritis of knee: Secondary | ICD-10-CM | POA: Diagnosis not present

## 2022-10-07 DIAGNOSIS — Z23 Encounter for immunization: Secondary | ICD-10-CM

## 2022-10-07 DIAGNOSIS — E663 Overweight: Secondary | ICD-10-CM | POA: Diagnosis not present

## 2022-10-07 DIAGNOSIS — Z13220 Encounter for screening for lipoid disorders: Secondary | ICD-10-CM | POA: Diagnosis not present

## 2022-10-07 DIAGNOSIS — E559 Vitamin D deficiency, unspecified: Secondary | ICD-10-CM | POA: Diagnosis not present

## 2022-10-07 DIAGNOSIS — R7303 Prediabetes: Secondary | ICD-10-CM | POA: Diagnosis not present

## 2022-10-07 DIAGNOSIS — R070 Pain in throat: Secondary | ICD-10-CM | POA: Diagnosis not present

## 2022-10-07 DIAGNOSIS — E041 Nontoxic single thyroid nodule: Secondary | ICD-10-CM

## 2022-10-07 DIAGNOSIS — J302 Other seasonal allergic rhinitis: Secondary | ICD-10-CM | POA: Diagnosis not present

## 2022-10-07 DIAGNOSIS — N952 Postmenopausal atrophic vaginitis: Secondary | ICD-10-CM

## 2022-10-07 MED ORDER — LORATADINE 10 MG PO TABS: 10.0000 mg | ORAL_TABLET | Freq: Every day | ORAL | 5 refills | Status: DC | PRN

## 2022-10-07 MED ORDER — MELOXICAM 7.5 MG PO TABS
ORAL_TABLET | ORAL | 0 refills | Status: DC
Start: 2022-10-07 — End: 2023-07-31

## 2022-10-07 NOTE — Patient Instructions (Addendum)
Annual exam in Feb 7 or after , call if you need me sooner  Flu vaccine today  Fasting CBC, lipid, cmp and eGFR, TSH and vit D last week in January  New medication sent for as needed use only, meloxicam , for  arthritic pain , tylenol is best to use  It is important that you exercise regularly at least 30 minutes 5 times a week. If you develop chest pain, have severe difficulty breathing, or feel very tired, stop exercising immediately and seek medical attention     Think about what you will eat, plan ahead. Choose " clean, green, fresh or frozen" over canned, processed or packaged foods which are more sugary, salty and fatty. 70 to 75% of food eaten should be vegetables and fruit. Three meals at set times with snacks allowed between meals, but they must be fruit or vegetables. Aim to eat over a 12 hour period , example 7 am to 7 pm, and STOP after  your last meal of the day. Drink water,generally about 64 ounces per day, no other drink is as healthy. Fruit juice is best enjoyed in a healthy way, by EATING the fruit.   Thanks for choosing [facility], we consider it a privelige to serve you.

## 2022-10-08 DIAGNOSIS — M179 Osteoarthritis of knee, unspecified: Secondary | ICD-10-CM | POA: Insufficient documentation

## 2022-10-08 NOTE — Assessment & Plan Note (Signed)
Using topical Vaseline only

## 2022-10-08 NOTE — Assessment & Plan Note (Signed)
  Patient re-educated about  the importance of commitment to a  minimum of 150 minutes of exercise per week as able.  The importance of healthy food choices with portion control discussed, as well as eating regularly and within a 12 hour window most days. The need to choose "clean , green" food 50 to 75% of the time is discussed, as well as to make water the primary drink and set a goal of 64 ounces water daily.       10/07/2022   10:05 AM 08/20/2022    8:20 AM 07/22/2022    9:17 AM  Weight /BMI  Weight 163 lb 1.9 oz 165 lb 166 lb  Height 5\' 5"  (1.651 m) 5\' 5"  (1.651 m) 5\' 5"  (1.651 m)  BMI 27.14 kg/m2 27.46 kg/m2 27.62 kg/m2

## 2022-10-08 NOTE — Assessment & Plan Note (Signed)
Increased symptoms anticipated with change in weather , prescription sent

## 2022-10-08 NOTE — Assessment & Plan Note (Signed)
Good response to elavil per GI will continue same and keep scheduled follow up

## 2022-10-08 NOTE — Progress Notes (Signed)
Gina Nelson     MRN: [id number]      DOB: [birth year]  Chief Complaint  Patient presents with   Follow-up    Follow up     HPI Gina Nelson is here for follow up and re-evaluation of chronic medical conditions, medication management and review of any available recent lab and radiology data.  Preventive health is updated, specifically  Cancer screening and Immunization.   Reports intermittent stiffness and pain in knees, which responds to tylenol for the most part, although she did try celebrex and found it to be even more effective . Does not feel she will need more than 4 to 6 tabs per month. Is committing to exercise program and will rely on tylenol for the most part   ROS Denies recent fever or chills. Denies sinus pressure, nasal congestion, ear pain or sore throat. Denies chest congestion, productive cough or wheezing. Denies chest pains, palpitations and leg swelling Denies abdominal pain, nausea, vomiting,diarrhea or constipation.   Denies dysuria, frequency, hesitancy or incontinence.  Denies headaches, seizures, numbness, or tingling. Denies depression, anxiety or insomnia. Denies skin break down or rash.   PE  BP 115/75 (BP Location: Right Arm, Patient Position: Sitting, Cuff Size: Normal)   Pulse 78   Ht 5\' 5"  (1.651 m)   Wt 163 lb 1.9 oz (74 kg)   SpO2 97%   BMI 27.14 kg/m   Patient alert and oriented and in no cardiopulmonary distress.  HEENT: No facial asymmetry, EOMI,     Neck supple .  Chest: Clear to auscultation bilaterally.  CVS: S1, S2 no murmurs, no S3.Regular rate.  ABD: Soft non tender.   Ext: No edema  MS: Adequate ROM spine, shoulders, hips and knees.Mild crepitus in knees  Skin: Intact, no ulcerations or rash noted.  Psych: Good eye contact, normal affect. Memory intact not anxious or depressed appearing.  CNS: CN 2-12 intact, power,  normal throughout.no focal deficits noted.   Assessment & Plan  Seasonal allergies Increased  symptoms anticipated with change in weather , prescription sent  Throat discomfort Good response to elavil per GI will continue same and keep scheduled follow up  Postmenopause atrophic vaginitis Using topical Vaseline only   Overweight (BMI 25.0-29.9)  Patient re-educated about  the importance of commitment to a  minimum of 150 minutes of exercise per week as able.  The importance of healthy food choices with portion control discussed, as well as eating regularly and within a 12 hour window most days. The need to choose "clean , green" food 50 to 75% of the time is discussed, as well as to make water the primary drink and set a goal of 64 ounces water daily.       10/07/2022   10:05 AM 08/20/2022    8:20 AM 07/22/2022    9:17 AM  Weight /BMI  Weight 163 lb 1.9 oz 165 lb 166 lb  Height 5\' 5"  (1.651 m) 5\' 5"  (1.651 m) 5\' 5"  (1.651 m)  BMI 27.14 kg/m2 27.46 kg/m2 27.62 kg/m2      Osteoarthritis, knee Weight loss, regular exercise , tylenol as needed, and intermittent meloxicam

## 2022-10-08 NOTE — Assessment & Plan Note (Signed)
Weight loss, regular exercise , tylenol as needed, and intermittent meloxicam

## 2022-10-22 ENCOUNTER — Other Ambulatory Visit: Payer: Self-pay | Admitting: Family Medicine

## 2022-10-30 ENCOUNTER — Ambulatory Visit: Payer: Medicare PPO | Admitting: Family Medicine

## 2022-10-30 ENCOUNTER — Encounter: Payer: Self-pay | Admitting: Family Medicine

## 2022-10-30 VITALS — BP 113/75 | HR 73 | Ht 65.0 in | Wt 168.1 lb

## 2022-10-30 DIAGNOSIS — K645 Perianal venous thrombosis: Secondary | ICD-10-CM | POA: Insufficient documentation

## 2022-10-30 MED ORDER — HYDROCORTISONE ACETATE 25 MG RE SUPP: 25.0000 mg | Freq: Two times a day (BID) | RECTAL | 3 refills | Status: DC

## 2022-10-30 MED ORDER — SENNOSIDES-DOCUSATE SODIUM 8.6-50 MG PO TABS: 1.0000 | ORAL_TABLET | Freq: Two times a day (BID) | ORAL | 3 refills | Status: AC

## 2022-10-30 NOTE — Assessment & Plan Note (Addendum)
Topical suppos , stool softener, surgery consult

## 2022-10-30 NOTE — Patient Instructions (Signed)
F/u as before, call if youn  need me sooner    Suppository is prescribed for you to use until pain and swelling have stopped. I recommend indefinitely taking a stool softener with fiber twice every day so you do not have to strain with BM. Senokot S   Take a laxative every 3 days as you have relied on, so that you do not have to strain at having a BM  You are referred for surgery evaluation/ consultation re best management for you hemorrhoid  Thanks for choosing [facility], we consider it a privelige to serve you.

## 2022-11-03 NOTE — Progress Notes (Signed)
Gina Nelson     MRN: [id number]      DOB: [birth year]  Chief Complaint  Patient presents with   Hemorrhoids    Hemorrhoid since yesterday painful     HPI Ms. Ovens is here with c/o painful hemorrhoid which started 1 day ago, does report straining at stool, never had such pain before  No other complaints  ROS Denies recent fever or chills. Denies sinus pressure, nasal congestion, ear pain or sore throat. Denies chest congestion, productive cough or wheezing. Denies chest pains, palpitations and leg swelling .    PE  BP 113/75 (BP Location: Right Arm, Patient Position: Sitting, Cuff Size: Large)   Pulse 73   Ht 5\' 5"  (1.651 m)   Wt 168 lb 1.9 oz (76.3 kg)   SpO2 96%   BMI 27.98 kg/m   Patient alert and oriented and in no cardiopulmonary distress. Pt in pain, no comnfortable position while sitting or lying down   HEENT: No facial asymmetry, EOMI,     Neck supple .  Chest: Clear to auscultation bilaterally.  CVS: S1, S2 no murmurs, no S3.Regular rate.  ABD: Soft non tender.  Rectal: external hemmorhoid thrombosed, urinable to reduce , tender, no visible rectal b lood Ext: No edema    Assessment & Plan  Hemorrhoid thrombosis Topical suppos , stool softener, syrgery consult

## 2022-11-10 ENCOUNTER — Ambulatory Visit (INDEPENDENT_AMBULATORY_CARE_PROVIDER_SITE_OTHER): Payer: Medicare PPO | Admitting: Gastroenterology

## 2022-11-10 ENCOUNTER — Encounter (INDEPENDENT_AMBULATORY_CARE_PROVIDER_SITE_OTHER): Payer: Self-pay | Admitting: Gastroenterology

## 2022-11-10 VITALS — BP 111/75 | HR 64 | Temp 98.1°F | Ht 65.0 in | Wt 168.2 lb

## 2022-11-10 DIAGNOSIS — R09A2 Foreign body sensation, throat: Secondary | ICD-10-CM | POA: Insufficient documentation

## 2022-11-10 MED ORDER — AMITRIPTYLINE HCL 10 MG PO TABS: 10.0000 mg | ORAL_TABLET | Freq: Every day | ORAL | 3 refills | Status: DC

## 2022-11-10 NOTE — Progress Notes (Addendum)
Referring Provider: Kerri Perches, MD Primary Care Physician:  Kerri Perches, MD Primary GI Physician: Dr. Levon Hedger   Chief Complaint  Patient presents with   Dysphagia    Follow up on dysphagia. Doing better. Needs refills on med. Patient unsure of name of med. She said its a red tablet and has been doing well with dysphagia since starting it. Called pharmacy and they did not see any gerd meds they have been filling but was going to fax me a list of meds she has been filling.    HPI:   Gina Nelson is a 72 y.o. female with past medical history of arthritis, right breast cancer, chronic neck discomfort   Patient presenting today for follow up of dysphagia  Last seen April 2024, at that time reporting persistent sensation of something stuck in her throat for the past 10 years.  Noting symptoms more frequently recently when swallowing pills which sometimes get stuck.  Recent ultrasound of the neck ordered by PCP which showed a thyroid nodule but no compression found.  Patient recommended to schedule EGD, consider esophagram if EGD negative.  EGD in June as outlined below, patient started on Elavil 10 mg at bedtime.  Present:  Patient states globus sensation has improved with use of elavil. She has very rare feeling of globus sensation if she eats something like popcorn but no issues with her regular diet. Denies any GERD symptoms. She has no other GI complaints. No red flag symptoms. Patient denies melena, hematochezia, nausea, vomiting, diarrhea, constipation, dysphagia, odyonophagia, early satiety or weight loss.    Last Colonoscopy:04/25/2021 Diminutive polyp in the proximal transverse colon (hyperplastic polyp) diverticulosis, external hemorrhoids. Advised to not repeat colonoscopy given age Last Endoscopy:06/2022 - No endoscopic esophageal abnormality to explain                            patient's dysphagia. Esophagus dilated. Dilated.                           -  Z-line irregular, 37 cm from the incisors.                            Biopsied.                           - 2 cm hiatal hernia.                           - Normal stomach.                           - Normal examined duodenum.  Pathology showed normal GE junction, no need to repeat EGD   Past Medical History:  Diagnosis Date   Allergy    Arthritis    knee   Blood transfusion    Cancer (HCC)    right breast T1cN2   GERD (gastroesophageal reflux disease)    PONV (postoperative nausea and vomiting)     Past Surgical History:  Procedure Laterality Date   ABDOMINAL HYSTERECTOMY  1984   BIOPSY  12/12/2016   Procedure: BIOPSY;  Surgeon: Malissa Hippo, MD;  [facility];  Service: Endoscopy;;  gastric   BIOPSY  04/25/2021   Procedure: BIOPSY;  Surgeon: Malissa Hippo, MD;  [facility];  Service: Endoscopy;;   BIOPSY  06/24/2022   Procedure: BIOPSY;  Surgeon: Marguerita Merles, Reuel Boom, MD;  [facility];  Service: Gastroenterology;;   BREAST SURGERY  right MRM, left simple mastectomy   2012   BREAST SURGERY  left mastectomy   2012   COLONOSCOPY  05/07/2011   Procedure: COLONOSCOPY;  Surgeon: Malissa Hippo, MD;  [facility];  Service: Endoscopy;  Laterality: N/A;  830   COLONOSCOPY WITH PROPOFOL N/A 04/25/2021   Procedure: COLONOSCOPY WITH PROPOFOL;  Surgeon: Malissa Hippo, MD;  [facility];  Service: Endoscopy;  Laterality: N/A;  10:00   ESOPHAGEAL DILATION N/A 06/24/2022   Procedure: ESOPHAGEAL DILATION;  Surgeon: Dolores Frame, MD;  [facility];  Service: Gastroenterology;  Laterality: N/A;  2:30;asa 3   ESOPHAGOGASTRODUODENOSCOPY N/A 12/12/2016   Procedure: ESOPHAGOGASTRODUODENOSCOPY (EGD);  Surgeon: Malissa Hippo, MD;  [facility];  Service: Endoscopy;  Laterality: N/A;  9:15   ESOPHAGOGASTRODUODENOSCOPY (EGD) WITH PROPOFOL N/A 06/24/2022   Procedure: ESOPHAGOGASTRODUODENOSCOPY  (EGD) WITH PROPOFOL;  Surgeon: Dolores Frame, MD;  [facility];  Service: Gastroenterology;  Laterality: N/A;  to arrive at 0715.   JOINT REPLACEMENT  11/2009   right knee   POLYPECTOMY  04/25/2021   Procedure: POLYPECTOMY;  Surgeon: Malissa Hippo, MD;  [facility];  Service: Endoscopy;;   PORT-A-CATH REMOVAL  07/16/10   TOTAL KNEE ARTHROPLASTY  2011    Current Outpatient Medications  Medication Sig Dispense Refill   amitriptyline (ELAVIL) 10 MG tablet Take 1 tablet (10 mg total) by mouth at bedtime. 90 tablet 1   betamethasone dipropionate 0.05 % cream APPLY A FINGER TIP AMOUNT TWICE WEEKLY AS NEEDED FOR DISCOMFORT. 45 g 0   bisacodyl (DULCOLAX) 5 MG EC tablet Take 5 mg by mouth daily as needed for moderate constipation.     cholecalciferol (VITAMIN D3) 25 MCG (1000 UNIT) tablet Take 1,000 Units by mouth 4 (four) times a week.     clotrimazole-betamethasone (LOTRISONE) cream Apply 1 Application topically 2 (two) times daily as needed (irritation behind ears due to eye glasses).     hydrocortisone (ANUSOL-HC) 25 MG suppository Place 1 suppository (25 mg total) rectally 2 (two) times daily. 12 suppository 3   loratadine (CLARITIN) 10 MG tablet Take 1 tablet (10 mg total) by mouth daily as needed for allergies. 30 tablet 5   meclizine (ANTIVERT) 12.5 MG tablet TAKE 1 TABLET BY MOUTH THREE TIMES DAILY AS NEEDED FOR DIZZINESS 21 tablet 0   meloxicam (MOBIC) 7.5 MG tablet Take one tablet by mouth once daily , as needed, for arthritic pain 30 tablet 0   senna-docusate (SENOKOT-S) 8.6-50 MG tablet Take 1 tablet by mouth 2 (two) times daily. 60 tablet 3   No current facility-administered medications for this visit.    Allergies as of 11/10/2022   (No Known Allergies)    Family History  Problem Relation Age of Onset   Prostate cancer Father    Pancreatic cancer Sister    Pneumonia Mother    Hypertension Daughter    Review of systems General: negative  for malaise, night sweats, fever, chills, weight los Neck: Negative for lumps, goiter, pain and significant neck swelling Resp: Negative for cough, wheezing, dyspnea at rest CV: Negative for chest pain, leg swelling, palpitations, orthopnea GI: denies melena, hematochezia, nausea, vomiting, diarrhea, constipation, dysphagia, odyonophagia, early satiety or  unintentional weight loss.  The remainder of the review of systems is noncontributory.  Physical Exam: BP 111/75 (BP Location: Left Arm, Patient Position: Sitting, Cuff Size: Normal)   Pulse 64   Temp 98.1 F (36.7 C) (Oral)   Ht 5\' 5"  (1.651 m)   Wt 168 lb 3.2 oz (76.3 kg)   BMI 27.99 kg/m  General:   Alert and oriented. No distress noted. Pleasant and cooperative.  Head:  Normocephalic and atraumatic. Eyes:  Conjuctiva clear without scleral icterus. Mouth:  Oral mucosa pink and moist. Good dentition. No lesions. Heart: Normal rate and rhythm, s1 and s2 heart sounds present.  Lungs: Clear lung sounds in all lobes. Respirations equal and unlabored. Abdomen:  +BS, soft, non-tender and non-distended. No rebound or guarding. No HSM or masses noted. Neurologic:  Alert and  oriented x4 Psych:  Alert and cooperative. Normal mood and affect.  Invalid input(s): "6 MONTHS"   ASSESSMENT: Gina Nelson is a 72 y.o. female presenting today for follow-up of globus sensation/throat discomfort.  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  Notes very occasional throat discomfort if she eats some like popcorn, otherwise no issues.  She denies any other GI symptoms.  Will continue with Elavil 10 mg nightly, she should let me know if she develops any new or worsening GI symptoms.   PLAN:  Continue elavil 10mg  at  bedtime, refilled  2. Pt to make me aware of any new or worsening GI symptoms   All questions were answered, patient verbalized understanding and is in agreement with plan as outlined above.   Follow Up: 1 year   Mahmood Boehringer L. Jeanmarie Hubert, MSN, APRN, AGNP-C Adult-Gerontology Nurse Practitioner [facility]  I have reviewed the note and agree with the APP's assessment as described in this progress note  Katrinka Blazing, MD Gastroenterology and Hepatology [facility]

## 2022-11-10 NOTE — Patient Instructions (Signed)
Please continue with elavil 10mg  nightly, I have sent a refill of this  Let me know if you have any new or worsening GI issues  We will plan to see you back in 1 year  It was a pleasure to see you today. I want to create trusting relationships with patients and provide genuine, compassionate, and quality care. I truly value your feedback! please be on the lookout for a survey regarding your visit with me today. I appreciate your input about our visit and your time in completing this!    Riane Rung L. Jeanmarie Hubert, MSN, APRN, AGNP-C Adult-Gerontology Nurse Practitioner [facility] at [facility]

## 2022-11-11 ENCOUNTER — Ambulatory Visit: Payer: Medicare PPO | Admitting: Surgery

## 2022-11-11 ENCOUNTER — Encounter: Payer: Self-pay | Admitting: Surgery

## 2022-11-11 VITALS — BP 121/71 | HR 69 | Temp 98.0°F | Resp 12 | Ht 65.0 in | Wt 167.0 lb

## 2022-11-11 DIAGNOSIS — K644 Residual hemorrhoidal skin tags: Secondary | ICD-10-CM

## 2022-11-11 NOTE — Progress Notes (Signed)
[facility] History and Physical  Reason for Referral: Hemorrhoid Referring Physician: Dr. Lodema Hong  Chief Complaint   New Patient (Initial Visit)     Gina Nelson is a 72 y.o. female.  HPI: Patient presents for evaluation of hemorrhoids.  About 2 weeks ago, she was significantly constipated and straining for a large amount of time on the toilet.  After that, she noted she had a painful external hemorrhoid.  She previously would take a laxative every 2 to 3 days for her constipation, but when she followed up with Dr. Lodema Hong, she was started on a daily stool softener and laxative every other day.  Since that time, she has been moving her bowels more regularly.  She still has pain associated with the hemorrhoid, specifically with straining with having bowel movements.  She denies any bloody drainage.  She has also tried Preparation H, Anusol suppositories, both of which did not really help her pain.  She last had a colonoscopy a year ago, and was noted to have internal hemorrhoids at that time.  Her past medical history significant for right-sided breast cancer for which she underwent a double mastectomy and vertigo.  She denies use of blood thinning medications.  Her surgical history is significant for a total abdominal hysterectomy and knee surgery in addition to the double mastectomy.  She denies use of tobacco products, alcohol, and illicit drugs.  Past Medical History:  Diagnosis Date   Allergy    Arthritis    knee   Blood transfusion    Cancer (HCC)    right breast T1cN2   GERD (gastroesophageal reflux disease)    PONV (postoperative nausea and vomiting)     Past Surgical History:  Procedure Laterality Date   ABDOMINAL HYSTERECTOMY  1984   BIOPSY  12/12/2016   Procedure: BIOPSY;  Surgeon: Malissa Hippo, MD;  [facility];  Service: Endoscopy;;  gastric   BIOPSY  04/25/2021   Procedure: BIOPSY;  Surgeon: Malissa Hippo, MD;  [facility];  Service: Endoscopy;;   BIOPSY  06/24/2022   Procedure: BIOPSY;  Surgeon: Marguerita Merles, Reuel Boom, MD;  [facility];  Service: Gastroenterology;;   BREAST SURGERY  right MRM, left simple mastectomy   2012   BREAST SURGERY  left mastectomy   2012   COLONOSCOPY  05/07/2011   Procedure: COLONOSCOPY;  Surgeon: Malissa Hippo, MD;  [facility];  Service: Endoscopy;  Laterality: N/A;  830   COLONOSCOPY WITH PROPOFOL N/A 04/25/2021   Procedure: COLONOSCOPY WITH PROPOFOL;  Surgeon: Malissa Hippo, MD;  [facility];  Service: Endoscopy;  Laterality: N/A;  10:00   ESOPHAGEAL DILATION N/A 06/24/2022   Procedure: ESOPHAGEAL DILATION;  Surgeon: Dolores Frame, MD;  [facility];  Service: Gastroenterology;  Laterality: N/A;  2:30;asa 3   ESOPHAGOGASTRODUODENOSCOPY N/A 12/12/2016   Procedure: ESOPHAGOGASTRODUODENOSCOPY (EGD);  Surgeon: Malissa Hippo, MD;  [facility];  Service: Endoscopy;  Laterality: N/A;  9:15   ESOPHAGOGASTRODUODENOSCOPY (EGD) WITH PROPOFOL N/A 06/24/2022   Procedure: ESOPHAGOGASTRODUODENOSCOPY (EGD) WITH PROPOFOL;  Surgeon: Dolores Frame, MD;  [facility];  Service: Gastroenterology;  Laterality: N/A;  to arrive at 0715.   JOINT REPLACEMENT  11/2009   right knee   POLYPECTOMY  04/25/2021   Procedure: POLYPECTOMY;  Surgeon: Malissa Hippo, MD;  [facility];  Service: Endoscopy;;   PORT-A-CATH REMOVAL  07/16/10   TOTAL KNEE ARTHROPLASTY  2011    Family History  Problem Relation Age of Onset   Prostate cancer Father    Pancreatic cancer Sister    Pneumonia Mother    Hypertension Daughter     Social History   Tobacco Use   Smoking status: Never   Smokeless tobacco: Never  Vaping Use   Vaping status: Never Used  Substance Use Topics   Alcohol use: No   Drug use: No    Medications: I have reviewed the patient's current medications. Allergies as of  11/11/2022   No Known Allergies      Medication List        Accurate as of November 11, 2022  2:45 PM. If you have any questions, ask your nurse or doctor.          amitriptyline 10 MG tablet Commonly known as: ELAVIL Take 1 tablet (10 mg total) by mouth at bedtime.   betamethasone dipropionate 0.05 % cream APPLY A FINGER TIP AMOUNT TWICE WEEKLY AS NEEDED FOR DISCOMFORT.   bisacodyl 5 MG EC tablet Commonly known as: DULCOLAX Take 5 mg by mouth daily as needed for moderate constipation.   cholecalciferol 25 MCG (1000 UNIT) tablet Commonly known as: VITAMIN D3 Take 1,000 Units by mouth 4 (four) times a week.   clotrimazole-betamethasone cream Commonly known as: LOTRISONE Apply 1 Application topically 2 (two) times daily as needed (irritation behind ears due to eye glasses).   hydrocortisone 25 MG suppository Commonly known as: ANUSOL-HC Place 1 suppository (25 mg total) rectally 2 (two) times daily.   loratadine 10 MG tablet Commonly known as: CLARITIN Take 1 tablet (10 mg total) by mouth daily as needed for allergies.   meclizine 12.5 MG tablet Commonly known as: ANTIVERT TAKE 1 TABLET BY MOUTH THREE TIMES DAILY AS NEEDED FOR DIZZINESS   meloxicam 7.5 MG tablet Commonly known as: MOBIC Take one tablet by mouth once daily , as needed, for arthritic pain   senna-docusate 8.6-50 MG tablet Commonly known as: Senokot-S Take 1 tablet by mouth 2 (two) times daily.         ROS:  Constitutional: negative for chills, fatigue, and fevers Eyes: negative for visual disturbance and pain Ears, nose, mouth, throat, and face: positive for sinus problems, negative for ear drainage and sore throat Respiratory: negative for cough, wheezing, and shortness of breath Cardiovascular: negative for chest pain and palpitations Gastrointestinal: positive for nausea, negative for abdominal pain, reflux symptoms, and vomiting Genitourinary:negative for dysuria and  frequency Integument/breast: negative for dryness and rash Hematologic/lymphatic: negative for bleeding and lymphadenopathy Musculoskeletal:negative for back pain and neck pain Neurological: negative for dizziness and tremors Endocrine: negative for temperature intolerance  Blood pressure 121/71, pulse 69, temperature 98 F (36.7 C), temperature source Oral, resp. rate 12, height 5\' 5"  (1.651 m), weight 167 lb (75.8 kg), SpO2 98%. Physical Exam Vitals reviewed.  Constitutional:      Appearance: Normal appearance.  HENT:     Head: Normocephalic and atraumatic.  Eyes:     Extraocular Movements: Extraocular movements intact.     Pupils: Pupils are equal, round, and reactive to light.  Cardiovascular:     Rate and Rhythm: Normal rate and regular rhythm.     Pulses: Normal pulses.     Heart sounds: Normal heart sounds.  Pulmonary:     Effort: Pulmonary effort is normal.     Breath sounds: Normal breath sounds.  Abdominal:     General: There is no distension.  Palpations: Abdomen is soft.     Tenderness: There is no abdominal tenderness.  Genitourinary:    Comments: Nonthrombosed external hemorrhoid at the 3 o'clock position with patient in a left lateral decubitus position, small internal hemorrhoids palpable on digital rectal exam, no gross blood, mild tenderness to palpation at external hemorrhoid Musculoskeletal:        General: Normal range of motion.     Cervical back: Normal range of motion.  Skin:    General: Skin is warm and dry.  Neurological:     General: No focal deficit present.     Mental Status: She is alert and oriented to person, place, and time.  Psychiatric:        Mood and Affect: Mood normal.        Behavior: Behavior normal.     Results: No results found for this or any previous visit (from the past 48 hour(s)).  No results found.   Assessment & Plan:  Gina Nelson is a 72 y.o. female who presents for evaluation of her hemorrhoids.  -We  discussed the pathophysiology of hemorrhoids.  We further discussed that conservative treatment is usually the first line treatment for hemorrhoids, including suppositories, bowel regimen, sitz baths, and pain control -Hemorrhoid surgery for external hemorrhoids is very painful. The pain and discomfort that the patient is having currently will be magnified after the surgery for at least 2-3 weeks.  The patient will have feelings of constant pressure and pain in the area from the swelling and removal of the anoderm (skin around the anus). The internal hemorrhoids are not painful to remove because the same nerves are not involved, and the sensation is different, but removal of any external hemorrhoids will cause significant discomfort. They will need at least 4-6 weeks to recover from the surgery, and should not expect to be able to feel back to "normal for 6-8 weeks."   -The risk of hemorrhoid surgery include bleeding, risk of infection although rare, and the risk of narrowing the anal canal if too much tissue is removed. Given this risk, it is likely that only the 2 largest hemorrhoid columns would be removed during the initial surgery.  We have also discussed the risk of incontinence after surgery if the muscles were injured, and although this is rare that it can happen and is another reason to limit the amount of hemorrhoids removed.   -Patient has decided that she would like to proceed with surgery for her hemorrhoids.  Tentatively scheduled for surgery on 11/14 -Information provided to the patient regarding hemorrhoids -Advised that she needs to continue her bowel regimen to decrease the risk of any issues between now and her surgery -Prescription also provided for Americaine ointment to help with pain between now and surgery  All questions were answered to the satisfaction of the patient.  Theophilus Kinds, DO [facility] [address]ella Raring [place], [place]  [phone] (office)

## 2022-11-12 MED ORDER — AMERICAINE 20 % RE OINT: TOPICAL_OINTMENT | RECTAL | 0 refills | Status: DC | PRN

## 2022-11-14 NOTE — H&P (Signed)
[facility] History and Physical  Reason for Referral: Hemorrhoid Referring Physician: Dr. Lodema Hong  Chief Complaint   New Patient (Initial Visit)     Gina Nelson is a 72 y.o. female.  HPI: Patient presents for evaluation of hemorrhoids.  About 2 weeks ago, she was significantly constipated and straining for a large amount of time on the toilet.  After that, she noted she had a painful external hemorrhoid.  She previously would take a laxative every 2 to 3 days for her constipation, but when she followed up with Dr. Lodema Hong, she was started on a daily stool softener and laxative every other day.  Since that time, she has been moving her bowels more regularly.  She still has pain associated with the hemorrhoid, specifically with straining with having bowel movements.  She denies any bloody drainage.  She has also tried Preparation H, Anusol suppositories, both of which did not really help her pain.  She last had a colonoscopy a year ago, and was noted to have internal hemorrhoids at that time.  Her past medical history significant for right-sided breast cancer for which she underwent a double mastectomy and vertigo.  She denies use of blood thinning medications.  Her surgical history is significant for a total abdominal hysterectomy and knee surgery in addition to the double mastectomy.  She denies use of tobacco products, alcohol, and illicit drugs.  Past Medical History:  Diagnosis Date   Allergy    Arthritis    knee   Blood transfusion    Cancer (HCC)    right breast T1cN2   GERD (gastroesophageal reflux disease)    PONV (postoperative nausea and vomiting)     Past Surgical History:  Procedure Laterality Date   ABDOMINAL HYSTERECTOMY  1984   BIOPSY  12/12/2016   Procedure: BIOPSY;  Surgeon: Malissa Hippo, MD;  [facility];  Service: Endoscopy;;  gastric   BIOPSY  04/25/2021   Procedure: BIOPSY;  Surgeon: Malissa Hippo, MD;  [facility];  Service: Endoscopy;;   BIOPSY  06/24/2022   Procedure: BIOPSY;  Surgeon: Marguerita Merles, Reuel Boom, MD;  [facility];  Service: Gastroenterology;;   BREAST SURGERY  right MRM, left simple mastectomy   2012   BREAST SURGERY  left mastectomy   2012   COLONOSCOPY  05/07/2011   Procedure: COLONOSCOPY;  Surgeon: Malissa Hippo, MD;  [facility];  Service: Endoscopy;  Laterality: N/A;  830   COLONOSCOPY WITH PROPOFOL N/A 04/25/2021   Procedure: COLONOSCOPY WITH PROPOFOL;  Surgeon: Malissa Hippo, MD;  [facility];  Service: Endoscopy;  Laterality: N/A;  10:00   ESOPHAGEAL DILATION N/A 06/24/2022   Procedure: ESOPHAGEAL DILATION;  Surgeon: Dolores Frame, MD;  [facility];  Service: Gastroenterology;  Laterality: N/A;  2:30;asa 3   ESOPHAGOGASTRODUODENOSCOPY N/A 12/12/2016   Procedure: ESOPHAGOGASTRODUODENOSCOPY (EGD);  Surgeon: Malissa Hippo, MD;  [facility];  Service: Endoscopy;  Laterality: N/A;  9:15   ESOPHAGOGASTRODUODENOSCOPY (EGD) WITH PROPOFOL N/A 06/24/2022   Procedure: ESOPHAGOGASTRODUODENOSCOPY (EGD) WITH PROPOFOL;  Surgeon: Dolores Frame, MD;  [facility];  Service: Gastroenterology;  Laterality: N/A;  to arrive at 0715.   JOINT REPLACEMENT  11/2009   right knee   POLYPECTOMY  04/25/2021   Procedure: POLYPECTOMY;  Surgeon: Malissa Hippo, MD;  [facility];  Service: Endoscopy;;   PORT-A-CATH REMOVAL  07/16/10   TOTAL KNEE ARTHROPLASTY  2011    Family History  Problem Relation Age of Onset   Prostate cancer Father    Pancreatic cancer Sister    Pneumonia Mother    Hypertension Daughter     Social History   Tobacco Use   Smoking status: Never   Smokeless tobacco: Never  Vaping Use   Vaping status: Never Used  Substance Use Topics   Alcohol use: No   Drug use: No    Medications: I have reviewed the patient's current medications. Allergies as of  11/11/2022   No Known Allergies      Medication List        Accurate as of November 11, 2022  2:45 PM. If you have any questions, ask your nurse or doctor.          amitriptyline 10 MG tablet Commonly known as: ELAVIL Take 1 tablet (10 mg total) by mouth at bedtime.   betamethasone dipropionate 0.05 % cream APPLY A FINGER TIP AMOUNT TWICE WEEKLY AS NEEDED FOR DISCOMFORT.   bisacodyl 5 MG EC tablet Commonly known as: DULCOLAX Take 5 mg by mouth daily as needed for moderate constipation.   cholecalciferol 25 MCG (1000 UNIT) tablet Commonly known as: VITAMIN D3 Take 1,000 Units by mouth 4 (four) times a week.   clotrimazole-betamethasone cream Commonly known as: LOTRISONE Apply 1 Application topically 2 (two) times daily as needed (irritation behind ears due to eye glasses).   hydrocortisone 25 MG suppository Commonly known as: ANUSOL-HC Place 1 suppository (25 mg total) rectally 2 (two) times daily.   loratadine 10 MG tablet Commonly known as: CLARITIN Take 1 tablet (10 mg total) by mouth daily as needed for allergies.   meclizine 12.5 MG tablet Commonly known as: ANTIVERT TAKE 1 TABLET BY MOUTH THREE TIMES DAILY AS NEEDED FOR DIZZINESS   meloxicam 7.5 MG tablet Commonly known as: MOBIC Take one tablet by mouth once daily , as needed, for arthritic pain   senna-docusate 8.6-50 MG tablet Commonly known as: Senokot-S Take 1 tablet by mouth 2 (two) times daily.         ROS:  Constitutional: negative for chills, fatigue, and fevers Eyes: negative for visual disturbance and pain Ears, nose, mouth, throat, and face: positive for sinus problems, negative for ear drainage and sore throat Respiratory: negative for cough, wheezing, and shortness of breath Cardiovascular: negative for chest pain and palpitations Gastrointestinal: positive for nausea, negative for abdominal pain, reflux symptoms, and vomiting Genitourinary:negative for dysuria and  frequency Integument/breast: negative for dryness and rash Hematologic/lymphatic: negative for bleeding and lymphadenopathy Musculoskeletal:negative for back pain and neck pain Neurological: negative for dizziness and tremors Endocrine: negative for temperature intolerance  Blood pressure 121/71, pulse 69, temperature 98 F (36.7 C), temperature source Oral, resp. rate 12, height 5\' 5"  (1.651 m), weight 167 lb (75.8 kg), SpO2 98%. Physical Exam Vitals reviewed.  Constitutional:      Appearance: Normal appearance.  HENT:     Head: Normocephalic and atraumatic.  Eyes:     Extraocular Movements: Extraocular movements intact.     Pupils: Pupils are equal, round, and reactive to light.  Cardiovascular:     Rate and Rhythm: Normal rate and regular rhythm.     Pulses: Normal pulses.     Heart sounds: Normal heart sounds.  Pulmonary:     Effort: Pulmonary effort is normal.     Breath sounds: Normal breath sounds.  Abdominal:     General: There is no distension.  Palpations: Abdomen is soft.     Tenderness: There is no abdominal tenderness.  Genitourinary:    Comments: Nonthrombosed external hemorrhoid at the 3 o'clock position with patient in a left lateral decubitus position, small internal hemorrhoids palpable on digital rectal exam, no gross blood, mild tenderness to palpation at external hemorrhoid Musculoskeletal:        General: Normal range of motion.     Cervical back: Normal range of motion.  Skin:    General: Skin is warm and dry.  Neurological:     General: No focal deficit present.     Mental Status: She is alert and oriented to person, place, and time.  Psychiatric:        Mood and Affect: Mood normal.        Behavior: Behavior normal.     Results: No results found for this or any previous visit (from the past 48 hour(s)).  No results found.   Assessment & Plan:  Gina Nelson is a 72 y.o. female who presents for evaluation of her hemorrhoids.  -We  discussed the pathophysiology of hemorrhoids.  We further discussed that conservative treatment is usually the first line treatment for hemorrhoids, including suppositories, bowel regimen, sitz baths, and pain control -Hemorrhoid surgery for external hemorrhoids is very painful. The pain and discomfort that the patient is having currently will be magnified after the surgery for at least 2-3 weeks.  The patient will have feelings of constant pressure and pain in the area from the swelling and removal of the anoderm (skin around the anus). The internal hemorrhoids are not painful to remove because the same nerves are not involved, and the sensation is different, but removal of any external hemorrhoids will cause significant discomfort. They will need at least 4-6 weeks to recover from the surgery, and should not expect to be able to feel back to "normal for 6-8 weeks."   -The risk of hemorrhoid surgery include bleeding, risk of infection although rare, and the risk of narrowing the anal canal if too much tissue is removed. Given this risk, it is likely that only the 2 largest hemorrhoid columns would be removed during the initial surgery.  We have also discussed the risk of incontinence after surgery if the muscles were injured, and although this is rare that it can happen and is another reason to limit the amount of hemorrhoids removed.   -Patient has decided that she would like to proceed with surgery for her hemorrhoids.  Tentatively scheduled for surgery on 11/14 -Information provided to the patient regarding hemorrhoids -Advised that she needs to continue her bowel regimen to decrease the risk of any issues between now and her surgery -Prescription also provided for Americaine ointment to help with pain between now and surgery  All questions were answered to the satisfaction of the patient.  Theophilus Kinds, DO [facility] [address] [place] [place], [place]  [address] [phone] (office)

## 2022-11-25 ENCOUNTER — Other Ambulatory Visit: Payer: Self-pay

## 2022-11-25 ENCOUNTER — Encounter (HOSPITAL_COMMUNITY)
Admission: RE | Admit: 2022-11-25 | Discharge: 2022-11-25 | Disposition: A | Discharge status: Home / Self Care | Payer: Medicare PPO | Source: Ambulatory Visit | Attending: Surgery | Admitting: Surgery

## 2022-11-25 ENCOUNTER — Encounter (HOSPITAL_COMMUNITY): Payer: Self-pay

## 2022-11-27 ENCOUNTER — Encounter (HOSPITAL_COMMUNITY): Payer: Self-pay | Admitting: Surgery

## 2022-11-27 ENCOUNTER — Ambulatory Visit (HOSPITAL_COMMUNITY)
Admission: RE | Admit: 2022-11-27 | Discharge: 2022-11-27 | Disposition: A | Discharge status: Home / Self Care | Payer: Medicare PPO | Attending: Surgery | Admitting: Surgery

## 2022-11-27 ENCOUNTER — Ambulatory Visit (HOSPITAL_COMMUNITY): Payer: Medicare PPO | Admitting: Certified Registered Nurse Anesthetist

## 2022-11-27 ENCOUNTER — Encounter (HOSPITAL_COMMUNITY)
Admission: RE | Disposition: A | Discharge status: Home / Self Care | Payer: Self-pay | Source: Home / Self Care | Attending: Surgery

## 2022-11-27 ENCOUNTER — Other Ambulatory Visit: Payer: Self-pay

## 2022-11-27 ENCOUNTER — Ambulatory Visit (HOSPITAL_BASED_OUTPATIENT_CLINIC_OR_DEPARTMENT_OTHER): Payer: Medicare PPO | Admitting: Certified Registered Nurse Anesthetist

## 2022-11-27 DIAGNOSIS — Z853 Personal history of malignant neoplasm of breast: Secondary | ICD-10-CM | POA: Insufficient documentation

## 2022-11-27 DIAGNOSIS — K5900 Constipation, unspecified: Secondary | ICD-10-CM | POA: Insufficient documentation

## 2022-11-27 DIAGNOSIS — K648 Other hemorrhoids: Secondary | ICD-10-CM | POA: Diagnosis not present

## 2022-11-27 DIAGNOSIS — Z9013 Acquired absence of bilateral breasts and nipples: Secondary | ICD-10-CM | POA: Insufficient documentation

## 2022-11-27 DIAGNOSIS — K219 Gastro-esophageal reflux disease without esophagitis: Secondary | ICD-10-CM | POA: Insufficient documentation

## 2022-11-27 DIAGNOSIS — K644 Residual hemorrhoidal skin tags: Secondary | ICD-10-CM | POA: Diagnosis not present

## 2022-11-27 DIAGNOSIS — K645 Perianal venous thrombosis: Secondary | ICD-10-CM | POA: Diagnosis not present

## 2022-11-27 DIAGNOSIS — K649 Unspecified hemorrhoids: Secondary | ICD-10-CM

## 2022-11-27 HISTORY — PX: HEMORRHOID SURGERY: SHX153

## 2022-11-27 HISTORY — DX: Residual hemorrhoidal skin tags: K64.4

## 2022-11-27 SURGERY — HEMORRHOIDECTOMY
Anesthesia: General | Site: Anus

## 2022-11-27 MED ORDER — ACETAMINOPHEN 500 MG PO TABS
1000.0000 mg | ORAL_TABLET | Freq: Four times a day (QID) | ORAL | 0 refills | Status: AC
Start: 2022-11-27 — End: 2022-12-04

## 2022-11-27 MED ORDER — SODIUM CHLORIDE 0.9 % IV SOLN
INTRAVENOUS | Status: AC
  Filled 2022-11-27: qty 2

## 2022-11-27 MED ORDER — ONDANSETRON HCL 4 MG PO TABS: 4.0000 mg | ORAL_TABLET | Freq: Every day | ORAL | 1 refills | Status: DC | PRN

## 2022-11-27 MED ORDER — SODIUM CHLORIDE 0.9 % IV SOLN
2.0000 g | INTRAVENOUS | Status: AC
  Administered 2022-11-27: 2 g via INTRAVENOUS

## 2022-11-27 MED ORDER — LIDOCAINE VISCOUS HCL 2 % MT SOLN
OROMUCOSAL | Status: DC | PRN
  Administered 2022-11-27: 1

## 2022-11-27 MED ORDER — ONDANSETRON HCL 4 MG/2ML IJ SOLN
4.0000 mg | Freq: Once | INTRAMUSCULAR | Status: DC | PRN
Start: 2022-11-27 — End: 2022-11-27

## 2022-11-27 MED ORDER — LACTATED RINGERS IV SOLN: INTRAVENOUS | Status: DC | PRN

## 2022-11-27 MED ORDER — OXYCODONE HCL 5 MG PO TABS: 5.0000 mg | ORAL_TABLET | Freq: Four times a day (QID) | ORAL | 0 refills | Status: DC | PRN

## 2022-11-27 MED ORDER — LIDOCAINE HCL URETHRAL/MUCOSAL 2 % EX GEL
CUTANEOUS | Status: AC
  Filled 2022-11-27: qty 10

## 2022-11-27 MED ORDER — DEXAMETHASONE SODIUM PHOSPHATE 10 MG/ML IJ SOLN
INTRAMUSCULAR | Status: DC | PRN
  Administered 2022-11-27: 10 mg via INTRAVENOUS

## 2022-11-27 MED ORDER — PROPOFOL 10 MG/ML IV BOLUS
INTRAVENOUS | Status: DC | PRN
  Administered 2022-11-27: 150 mg via INTRAVENOUS

## 2022-11-27 MED ORDER — BUPIVACAINE HCL (PF) 0.5 % IJ SOLN
INTRAMUSCULAR | Status: DC | PRN
  Administered 2022-11-27: 30 mL

## 2022-11-27 MED ORDER — OXYCODONE HCL 5 MG PO TABS: 5.0000 mg | ORAL_TABLET | Freq: Once | ORAL | Status: DC | PRN

## 2022-11-27 MED ORDER — LIDOCAINE HCL (CARDIAC) PF 100 MG/5ML IV SOSY
PREFILLED_SYRINGE | INTRAVENOUS | Status: DC | PRN
  Administered 2022-11-27: 60 mg via INTRAVENOUS

## 2022-11-27 MED ORDER — OXYCODONE HCL 5 MG/5ML PO SOLN
5.0000 mg | Freq: Once | ORAL | Status: DC | PRN
Start: 2022-11-27 — End: 2022-11-27

## 2022-11-27 MED ORDER — FENTANYL CITRATE (PF) 250 MCG/5ML IJ SOLN
INTRAMUSCULAR | Status: DC | PRN
  Administered 2022-11-27 (×2): 25 ug via INTRAVENOUS

## 2022-11-27 MED ORDER — EPHEDRINE SULFATE-NACL 50-0.9 MG/10ML-% IV SOSY
PREFILLED_SYRINGE | INTRAVENOUS | Status: DC | PRN
  Administered 2022-11-27 (×2): 10 mg via INTRAVENOUS

## 2022-11-27 MED ORDER — LIDOCAINE VISCOUS HCL 2 % MT SOLN
OROMUCOSAL | Status: AC
  Filled 2022-11-27: qty 15

## 2022-11-27 MED ORDER — SODIUM CHLORIDE 0.9 % IR SOLN
Status: DC | PRN
  Administered 2022-11-27: 1000 mL

## 2022-11-27 MED ORDER — CHLORHEXIDINE GLUCONATE CLOTH 2 % EX PADS: 6.0000 | MEDICATED_PAD | Freq: Once | CUTANEOUS | Status: DC

## 2022-11-27 MED ORDER — HEMOSTATIC AGENTS (NO CHARGE) OPTIME
TOPICAL | Status: DC | PRN
  Administered 2022-11-27 (×2): 1 via TOPICAL

## 2022-11-27 MED ORDER — FENTANYL CITRATE PF 50 MCG/ML IJ SOSY: 25.0000 ug | PREFILLED_SYRINGE | INTRAMUSCULAR | Status: DC | PRN

## 2022-11-27 MED ORDER — FENTANYL CITRATE (PF) 250 MCG/5ML IJ SOLN
INTRAMUSCULAR | Status: AC
  Filled 2022-11-27: qty 5

## 2022-11-27 MED ORDER — ONDANSETRON HCL 4 MG/2ML IJ SOLN
INTRAMUSCULAR | Status: DC | PRN
  Administered 2022-11-27: 4 mg via INTRAVENOUS

## 2022-11-27 MED ORDER — AMERICAINE 20 % RE OINT: TOPICAL_OINTMENT | RECTAL | 1 refills | Status: DC | PRN

## 2022-11-27 MED ORDER — BUPIVACAINE HCL (PF) 0.5 % IJ SOLN
INTRAMUSCULAR | Status: AC
  Filled 2022-11-27: qty 30

## 2022-11-27 MED ORDER — CHLORHEXIDINE GLUCONATE 0.12 % MT SOLN
OROMUCOSAL | Status: AC
  Filled 2022-11-27: qty 15

## 2022-11-27 SURGICAL SUPPLY — 30 items
BAG HAMPER (MISCELLANEOUS) ×1 IMPLANT
CLOTH BEACON ORANGE TIMEOUT ST (SAFETY) ×1 IMPLANT
COVER LIGHT HANDLE STERIS (MISCELLANEOUS) ×2 IMPLANT
DISSECTOR SURG LIGASURE 21 (MISCELLANEOUS) IMPLANT
DRAPE HALF SHEET 40X57 (DRAPES) ×2 IMPLANT
ELECT REM PT RETURN 9FT ADLT (ELECTROSURGICAL) ×1
ELECTRODE REM PT RTRN 9FT ADLT (ELECTROSURGICAL) ×1 IMPLANT
GAUZE 4X4 16PLY ~~LOC~~+RFID DBL (SPONGE) ×1 IMPLANT
GAUZE SPONGE 4X4 12PLY STRL (GAUZE/BANDAGES/DRESSINGS) ×1 IMPLANT
GLOVE BIOGEL M 7.0 STRL (GLOVE) IMPLANT
GLOVE BIOGEL PI IND STRL 6.5 (GLOVE) ×1 IMPLANT
GLOVE BIOGEL PI IND STRL 7.0 (GLOVE) ×2 IMPLANT
GLOVE SURG SS PI 6.5 STRL IVOR (GLOVE) ×2 IMPLANT
GOWN STRL REUS W/TWL LRG LVL3 (GOWN DISPOSABLE) ×2 IMPLANT
HEMOSTAT SURGICEL 4X8 (HEMOSTASIS) ×1 IMPLANT
KIT TURNOVER CYSTO (KITS) ×1 IMPLANT
MANIFOLD NEPTUNE II (INSTRUMENTS) ×1 IMPLANT
NDL HYPO 21X1.5 SAFETY (NEEDLE) ×1 IMPLANT
NEEDLE HYPO 21X1.5 SAFETY (NEEDLE) ×1
NS IRRIG 1000ML POUR BTL (IV SOLUTION) ×1 IMPLANT
PACK PERI GYN (CUSTOM PROCEDURE TRAY) ×1 IMPLANT
PAD ARMBOARD 7.5X6 YLW CONV (MISCELLANEOUS) ×1 IMPLANT
POSITIONER HEAD 8X9X4 ADT (SOFTGOODS) ×1 IMPLANT
SET BASIN LINEN APH (SET/KITS/TRAYS/PACK) ×1 IMPLANT
SPONGE SURGIFOAM ABS GEL 100 (HEMOSTASIS) ×1 IMPLANT
SURGILUBE 2OZ TUBE FLIPTOP (MISCELLANEOUS) ×1 IMPLANT
SUT SILK 0 FSL (SUTURE) ×1 IMPLANT
SUT VIC AB 2-0 CT2 27 (SUTURE) IMPLANT
SYR 30ML LL (SYRINGE) ×2 IMPLANT
SYR BULB IRRIG 60ML STRL (SYRINGE) ×1 IMPLANT

## 2022-11-27 NOTE — Op Note (Addendum)
[facility] Operative Note  11/27/22  Preoperative Diagnosis: Hemorrhoids   Postoperative Diagnosis: Same   Procedure(s) Performed: Hemorrhoidectomy, pudendal nerve block   Surgeon: Theophilus Kinds, DO    Assistants: No qualified resident was available    Anesthesia: LMA   Anesthesiologist: Dr. Leta Jungling   Specimens: hemorrhoids   Estimated Blood Loss: Minimal   Blood Replacement: None    Complications: None   Wound Class:Contaminated   Operative Indications: Patient is a 72 year old female who presents for hemorrhoidectomy.  She has had a painful external hemorrhoid for several weeks after straining with a bowel movement.  She has tried conservative measures which have not been very successful, so she would like to proceed with surgery.  All risks and benefits of performing this procedure were discussed with the patient including pain, infection, bleeding, damage to the surrounding structures, hemorrhoid recurrence, and need for more procedures or surgery. The patient voiced understanding of the procedure, all questions were sought and answered, and consent was obtained.hem  Findings: -External hemorrhoid with associated internal hemorrhoid at 3 o'clock position -Internal hemorrhoids at 6 and 9 o'clock   Procedure: The patient was taken to the operating room and placed supine. LMA anesthesia was induced. Intravenous antibiotics were administered per protocol.  Patient was then placed in lithotomy position. The perineum and anus prepared and draped in the usual sterile fashion.  A time-out was completed verifying correct patient, procedure, site, positioning, and implant(s) and/or special equipment prior to beginning this procedure.  The anal speculum was inserted. The patient was noted to have an external and internal hemorrhoid at the 3 o'clock position and internal hemorrhoids at the 6 and 9 o'clock positions.  Using the electrocautery, the skin was  opened at the 3 o'clock position.  Then using LigaSure, the skin tag with external and internal hemorrhoid was removed.  This was repeated at the 6 and 9 o'clock positions to remove the internal hemorrhoids.  The hemorrhoids were sent to pathology.  Hemostasis was achieved using electrocautery.  A pudendal nerve block was then performed by injected marcaine at the ischial tuberosity bilaterally.  Remaining local was injected around the anus.  A gelfoam roll wrapped with Surgicel and covered in americaine ointment was then inserted into the anus.  Gauze roll was placed in the gluteal cleft.  Final inspection revealed acceptable hemostasis. All counts were correct at the end of the case. The patient was awakened from anesthesia without complication.  The patient went to the PACU in stable condition.   Theophilus Kinds, DO  [facility] [address] [place], [place] [address] [phone] (office)

## 2022-11-27 NOTE — Anesthesia Preprocedure Evaluation (Addendum)
Anesthesia Evaluation  Patient identified by MRN, date of birth, ID band Patient awake    Reviewed: Allergy & Precautions, H&P , NPO status , Patient's Chart, lab work & pertinent test results  History of Anesthesia Complications (+) PONV and history of anesthetic complications  Airway Mallampati: II  TM Distance: >3 FB Neck ROM: Full    Dental  (+) Dental Advisory Given, Missing A few missing teeth in back only:   Pulmonary neg pulmonary ROS, former smoker   Pulmonary exam normal breath sounds clear to auscultation       Cardiovascular negative cardio ROS Normal cardiovascular exam Rhythm:Regular Rate:Normal     Neuro/Psych negative neurological ROS  negative psych ROS   GI/Hepatic Neg liver ROS,GERD  Medicated and Controlled,,  Endo/Other  negative endocrine ROS    Renal/GU negative Renal ROS  negative genitourinary   Musculoskeletal  (+) Arthritis , Osteoarthritis,    Abdominal   Peds negative pediatric ROS (+)  Hematology negative hematology ROS (+)   Anesthesia Other Findings Cancer  Reproductive/Obstetrics negative OB ROS                             Anesthesia Physical Anesthesia Plan  ASA: 2  Anesthesia Plan: General   Post-op Pain Management: Minimal or no pain anticipated   Induction: Intravenous  PONV Risk Score and Plan:   Airway Management Planned: Nasal Cannula and Natural Airway  Additional Equipment: None  Intra-op Plan:   Post-operative Plan:   Informed Consent: I have reviewed the patients History and Physical, chart, labs and discussed the procedure including the risks, benefits and alternatives for the proposed anesthesia with the patient or authorized representative who has indicated his/her understanding and acceptance.     Dental advisory given  Plan Discussed with: CRNA  Anesthesia Plan Comments:         Anesthesia Quick Evaluation

## 2022-11-27 NOTE — Anesthesia Procedure Notes (Signed)
Procedure Name: LMA Insertion Date/Time: 11/27/2022 9:39 AM  Performed by: Lorin Glass, CRNAPre-anesthesia Checklist: Emergency Drugs available, Patient identified, Suction available and Patient being monitored Patient Re-evaluated:Patient Re-evaluated prior to induction Oxygen Delivery Method: Circle system utilized Preoxygenation: Pre-oxygenation with 100% oxygen Induction Type: IV induction LMA: LMA inserted LMA Size: 3.0 Number of attempts: 1 Placement Confirmation: positive ETCO2 and breath sounds checked- equal and bilateral Tube secured with: Tape Dental Injury: Teeth and Oropharynx as per pre-operative assessment

## 2022-11-27 NOTE — Interval H&P Note (Signed)
History and Physical Interval Note:  11/27/2022 9:01 AM  Gina Nelson  has presented today for surgery, with the diagnosis of HEMORRHOID.  The various methods of treatment have been discussed with the patient and family. After consideration of risks, benefits and other options for treatment, the patient has consented to  Procedure(s): HEMORRHOIDECTOMY, SIMPLE (N/A) as a surgical intervention.  The patient's history has been reviewed, patient examined, no change in status, stable for surgery.  I have reviewed the patient's chart and labs.  Questions were answered to the patient's satisfaction.     [name]

## 2022-11-27 NOTE — Progress Notes (Signed)
[facility]  Spoke with the patient's family in the consultation room.  I explained that she tolerated the procedure without difficulty.  She has a roll in her anus that will come out with the first bowel movement.  I discharged her home with a prescription for narcotic pain medication that they should take as needed for pain.  I also want her taking scheduled Tylenol.  She needs to take scheduled daily stool softener and as needed laxative to prevent constipation.  She will also have a prescription for americaine ointment that can be used locally for pain control PRN.  The patient will follow-up with me in 2 weeks.  All questions were answered to their expressed satisfaction.  Theophilus Kinds, DO [facility] [address] [place] [place], [place] [address] [phone] (office)

## 2022-11-27 NOTE — Transfer of Care (Signed)
Immediate Anesthesia Transfer of Care Note  Patient: AAMIA UPTEGROVE  Procedure(s) Performed: HEMORRHOIDECTOMY, SIMPLE (Anus)  Patient Location: PACU  Anesthesia Type:General  Level of Consciousness: awake and alert   Airway & Oxygen Therapy: Patient Spontanous Breathing  Post-op Assessment: Report given to RN and Post -op Vital signs reviewed and stable  Post vital signs: Reviewed and stable  Last Vitals:  Vitals Value Taken Time  BP 135/60 11/27/22 1045  Temp 36.5 C 11/27/22 1039  Pulse 79 11/27/22 1049  Resp 14 11/27/22 1049  SpO2 96 % 11/27/22 1049  Vitals shown include unfiled device data.  Last Pain:  Vitals:   11/27/22 0830  TempSrc: Oral  PainSc: 0-No pain      Patients Stated Pain Goal: 5 (11/27/22 0830)  Complications: No notable events documented.

## 2022-11-27 NOTE — Anesthesia Postprocedure Evaluation (Signed)
Anesthesia Post Note  Patient: Gina Nelson  Procedure(s) Performed: HEMORRHOIDECTOMY, SIMPLE (Anus)  Patient location during evaluation: PACU Anesthesia Type: General Level of consciousness: awake and alert Pain management: pain level controlled Vital Signs Assessment: post-procedure vital signs reviewed and stable Respiratory status: spontaneous breathing, nonlabored ventilation, respiratory function stable and patient connected to nasal cannula oxygen Cardiovascular status: blood pressure returned to baseline and stable Postop Assessment: no apparent nausea or vomiting Anesthetic complications: no   There were no known notable events for this encounter.   Last Vitals:  Vitals:   11/27/22 1100 11/27/22 1127  BP: 137/65 120/83  Pulse: 73 72  Resp: 16 16  Temp:  (!) 36.4 C  SpO2: 97% 97%    Last Pain:  Vitals:   11/27/22 1127  TempSrc: Axillary  PainSc: 0-No pain                 Martyna Thorns L Inza Mikrut

## 2022-11-27 NOTE — Discharge Instructions (Addendum)
General Instructions: Please take your roxicodone as prescribed and alternate with tylenol every 4-6 hours.   Do not take any aspirin or NSAIDs, ibuprofen, aleve, BC powder for 5 days.  After 2 days, you can start taking these medications again.  Please keep the area clean and dry and take Sitz baths (swallow warm water baths) for comfort and after bowel movements.  If you cannot get in a bath tub, you can purchase a Sitz bath that goes on the toilet at the pharmacy.  Please keep your stools soft and take fiber daily (metamucil) and colace (over the counter) to help prevent constipation.  If you have not had a BM in 2 days, please take Miralax, and if you have not had a BM after this, please notify Dr. Robyne Peers.  Expect some bleeding following the hemorrhoid surgery and significant pain.  Go to the ED with extensive bleeding (soaking 2 large pads in < 1 hour), fevers, or chills.    Shower per your regular routine daily.   Rest and listen to your body, but do not remain in bed all day.  Walk everyday for at least 15-20 minutes. Deep cough and move around every 1-2 hours in the first few days after surgery.  Do not lift > 10 lbs, perform excessive bending, pushing, pulling, squatting. You will have pressure and pain at your anus and this is common. You may want to obtain a donut pillow from your pharmacy to sit on.   Some nausea is common and poor appetite. The main goal is to stay hydrated the first few days after surgery.   Pain Expectations and Narcotics: -After surgery you will have pain associated with your surgery and this is normal. The pain is muscular and nerve pain, and will get better with time. -You are encouraged and expected to take non narcotic medications like tylenol and ibuprofen (when able) to treat pain as multiple modalities can aid with pain treatment. -Narcotics are only used when pain is severe or there is breakthrough pain. -You are not expected to have a pain score of 0  after surgery, as we cannot prevent pain. A pain score of 3-4 that allows you to be functional, move, walk, and tolerate some activity is the goal. The pain will continue to improve over the days after surgery and is dependent on your surgery. -Due to [place] law, we are only able to give a certain amount of pain medication to treat post operative pain, and we only give additional narcotics on a patient by patient basis.  -For most laparoscopic surgery, studies have shown that the majority of patients only need 10-15 narcotic pills, and for open surgeries or anal surgeries most patients only need 15-20.   -Having appropriate expectations of pain and knowledge of pain management with non narcotics is important as we do not want anyone to become addicted to narcotic pain medication.  -Doing your Sitz baths will help with pain and pressure discomfort.  -Simple acts like meditation and mindfulness practices after surgery can also help with pain control and research has proven the benefit of these practices.  Ambulatory Surgery Discharge Instructions  General Anesthesia or Sedation Do not drive or operate heavy machinery for 24 hours.  Do not consume alcohol, tranquilizers, sleeping medications, or any non-prescribed medications for 24 hours. Do not make important decisions or sign any important papers in the next 24 hours. You should have someone with you tonight at home.  Activity  You are advised to  go directly home from the hospital.  Restrict your activities and rest for a day.  Resume light activity tomorrow. No heavy lifting over 10 lbs or strenuous exercise.  Fluids and Diet Begin with clear liquids, bouillon, dry toast, soda crackers.  If not nauseated, you may go to a regular diet when you desire.  Greasy and spicy foods are not advised.  Medications  If you have not had a bowel movement in 24 hours, take 2 tablespoons over the counter Milk of mag.             You May resume your blood  thinners tomorrow (Aspirin, coumadin, or other).  You are being discharged with prescriptions for Opioid/Narcotic Medications: There are some specific considerations for these medications that you should know. Opioid Meds have risks & benefits. Addiction to these meds is always a concern with prolonged use Take medication only as directed Do not drive while taking narcotic pain medication Do not crush tablets or capsules Do not use a different container than medication was dispensed in Lock the container of medication in a cool, dry place out of reach of children and pets. Opioid medication can cause addiction Do not share with anyone else (this is a felony) Do not store medications for future use. Dispose of them properly.     Disposal:  Find a [employer] household drug take back site near you.  If you can't get to a drug take back site, use the recipe below as a last resort to dispose of expired, unused or unwanted drugs. Disposal  (Do not dispose chemotherapy drugs this way, talk to your prescribing doctor instead.) Step 1: Mix drugs (do not crush) with dirt, kitty litter, or used coffee grounds and add a small amount of water to dissolve any solid medications. Step 2: Seal drugs in plastic bag. Step 3: Place plastic bag in trash. Step 4: Take prescription container and scratch out personal information, then recycle or throw away.  Operative Site  You have a roll in your anus that will come out with your first bowel movement.  After bowel movements, use wipes to clean and perform a sitz bath. Ok to English as a second language teacher. Keep wound clean and dry. No baths or swimming. No lifting more than 10 pounds.  Contact Information: If you have questions or concerns, please call our office, [phone], Monday- Thursday 8AM-5PM and Friday 8AM-12Noon.  If it is after hours or on the weekend, please call [facility] Main Number, [phone], and ask to speak to the surgeon on call for Dr. Robyne Peers at [facility].   SPECIFIC COMPLICATIONS TO WATCH FOR: Inability to urinate Fever over 101? F by mouth Nausea and vomiting lasting longer than 24 hours. Pain not relieved by medication ordered Swelling around the operative site Increased redness, warmth, hardness, around operative area Numbness, tingling, or cold fingers or toes Blood -soaked dressing, (small amounts of oozing may be normal) Increasing and progressive drainage from surgical area or exam site

## 2022-11-28 LAB — SURGICAL PATHOLOGY

## 2022-12-02 ENCOUNTER — Encounter (HOSPITAL_COMMUNITY): Payer: Self-pay | Admitting: Surgery

## 2022-12-10 ENCOUNTER — Other Ambulatory Visit: Payer: Self-pay

## 2022-12-10 ENCOUNTER — Ambulatory Visit (INDEPENDENT_AMBULATORY_CARE_PROVIDER_SITE_OTHER): Payer: Medicare PPO | Admitting: Surgery

## 2022-12-10 ENCOUNTER — Encounter: Payer: Self-pay | Admitting: Surgery

## 2022-12-10 VITALS — BP 118/78 | HR 70 | Temp 98.1°F | Resp 18 | Ht 65.0 in | Wt 166.0 lb

## 2022-12-10 DIAGNOSIS — Z09 Encounter for follow-up examination after completed treatment for conditions other than malignant neoplasm: Secondary | ICD-10-CM

## 2022-12-10 NOTE — Progress Notes (Unsigned)
[facility] Note   HPI:  72 y.o. Female presents to clinic for post-op follow-up s/p hemorrhoidectomy and pudendal nerve block on 11/14.  Patient states that she has been doing very well since the surgery.  She denies any significant pain.  She is tolerating a diet without nausea and vomiting, and she is moving her bowels without issue.  She denies fever and chills.  Review of Systems:  All other review of systems: otherwise negative   Vital Signs:  BP 118/78   Pulse 70   Temp 98.1 F (36.7 C) (Oral)   Resp 18   Ht 5\' 5"  (1.651 m)   Wt 166 lb (75.3 kg)   SpO2 97%   BMI 27.62 kg/m    Physical Exam:  Physical Exam Vitals reviewed.  Constitutional:      Appearance: Normal appearance.  Genitourinary:    Comments: Anus with healing area from recent hemorrhoidectomy, nontender to palpation, no evidence of bleeding or erythema Neurological:     Mental Status: She is alert.    Laboratory studies: None   Imaging:  None  Pathology: A. HEMORRHOIDS, HEMORRHOIDECTOMY:  Hemorrhoids with thrombosis and hemorrhage.  Negative for dysplasia or malignancy.     Assessment:  72 y.o. yo Female who presents for follow up s/p hemorrhoidectomy on 11/14.  Plan:  -Patient overall doing well.  She is tolerating diet, moving her bowels, and pain is well-controlled -Advised that the biggest thing that she can do to prevent issues with hemorrhoids in the future is to continue with her bowel regimen and decrease episodes of severe constipation requiring straining -Follow up as needed  All of the above recommendations were discussed with the patient, and all of patient's questions were answered to herexpressed satisfaction.  Theophilus Kinds, DO [facility] [address] [place] [place], [place] [address] [phone] (office)

## 2023-01-05 ENCOUNTER — Telehealth: Payer: Self-pay | Admitting: Family Medicine

## 2023-01-05 NOTE — Telephone Encounter (Signed)
Placard form  Copied Noted Sleeved (put in provider box)

## 2023-01-13 NOTE — Telephone Encounter (Signed)
Called patient will pick up form on Thursday or Friday

## 2023-01-16 NOTE — Telephone Encounter (Signed)
 Patient picked up forms.

## 2023-02-19 DIAGNOSIS — E663 Overweight: Secondary | ICD-10-CM | POA: Diagnosis not present

## 2023-02-19 DIAGNOSIS — E559 Vitamin D deficiency, unspecified: Secondary | ICD-10-CM | POA: Diagnosis not present

## 2023-02-19 DIAGNOSIS — E041 Nontoxic single thyroid nodule: Secondary | ICD-10-CM | POA: Diagnosis not present

## 2023-02-19 DIAGNOSIS — Z13220 Encounter for screening for lipoid disorders: Secondary | ICD-10-CM | POA: Diagnosis not present

## 2023-02-20 LAB — CMP14+EGFR
ALT: 16 [IU]/L (ref 0–32)
AST: 29 [IU]/L (ref 0–40)
Albumin: 4.2 g/dL (ref 3.8–4.8)
Alkaline Phosphatase: 190 [IU]/L — ABNORMAL HIGH (ref 44–121)
BUN/Creatinine Ratio: 8 — ABNORMAL LOW (ref 12–28)
BUN: 7 mg/dL — ABNORMAL LOW (ref 8–27)
Bilirubin Total: 0.4 mg/dL (ref 0.0–1.2)
CO2: 23 mmol/L (ref 20–29)
Calcium: 9.4 mg/dL (ref 8.7–10.3)
Chloride: 105 mmol/L (ref 96–106)
Creatinine, Ser: 0.88 mg/dL (ref 0.57–1.00)
Globulin, Total: 2.4 g/dL (ref 1.5–4.5)
Glucose: 80 mg/dL (ref 70–99)
Potassium: 3.9 mmol/L (ref 3.5–5.2)
Sodium: 143 mmol/L (ref 134–144)
Total Protein: 6.6 g/dL (ref 6.0–8.5)
eGFR: 70 mL/min/{1.73_m2} (ref >59)

## 2023-02-20 LAB — CBC
Hematocrit: 39.2 % (ref 34.0–46.6)
Hemoglobin: 12 g/dL (ref 11.1–15.9)
MCH: 25.7 pg — ABNORMAL LOW (ref 26.6–33.0)
MCHC: 30.6 g/dL — ABNORMAL LOW (ref 31.5–35.7)
MCV: 84 fL (ref 79–97)
Platelets: 189 10*3/uL (ref 150–450)
RBC: 4.67 x10E6/uL (ref 3.77–5.28)
RDW: 14.2 % (ref 11.7–15.4)
WBC: 7.6 10*3/uL (ref 3.4–10.8)

## 2023-02-20 LAB — LIPID PANEL
Chol/HDL Ratio: 2.9 {ratio} (ref 0.0–4.4)
Cholesterol, Total: 201 mg/dL — ABNORMAL HIGH (ref 100–199)
HDL: 70 mg/dL (ref >39)
LDL Chol Calc (NIH): 116 mg/dL — ABNORMAL HIGH (ref 0–99)
Triglycerides: 82 mg/dL (ref 0–149)
VLDL Cholesterol Cal: 15 mg/dL (ref 5–40)

## 2023-02-20 LAB — VITAMIN D 25 HYDROXY (VIT D DEFICIENCY, FRACTURES): Vit D, 25-Hydroxy: 33.3 ng/mL (ref 30.0–100.0)

## 2023-02-20 LAB — TSH: TSH: 1.87 u[IU]/mL (ref 0.450–4.500)

## 2023-02-25 ENCOUNTER — Ambulatory Visit (INDEPENDENT_AMBULATORY_CARE_PROVIDER_SITE_OTHER): Payer: Medicare PPO | Admitting: Family Medicine

## 2023-02-25 ENCOUNTER — Encounter: Payer: Self-pay | Admitting: Family Medicine

## 2023-02-25 VITALS — BP 126/58 | HR 71 | Ht 65.0 in | Wt 167.1 lb

## 2023-02-25 DIAGNOSIS — Z0001 Encounter for general adult medical examination with abnormal findings: Secondary | ICD-10-CM | POA: Diagnosis not present

## 2023-02-25 DIAGNOSIS — R748 Abnormal levels of other serum enzymes: Secondary | ICD-10-CM

## 2023-02-25 DIAGNOSIS — E041 Nontoxic single thyroid nodule: Secondary | ICD-10-CM

## 2023-02-25 NOTE — Assessment & Plan Note (Signed)
Annual exam as documented. . Immunization and cancer screening needs are specifically addressed at this visit.

## 2023-02-25 NOTE — Assessment & Plan Note (Signed)
Rept [place] to ensure 5 year stability per radiology rec

## 2023-02-25 NOTE — Assessment & Plan Note (Signed)
US liver and refer to gI

## 2023-02-25 NOTE — Patient Instructions (Addendum)
F/U in 5 to 6 months, call if you need me sooner  You do need another covid vaccine and this is available art your pharmacy  You are referred for an ultrasound of your thyroid gland to reevaluate the nodule, if there is no change then this will be the final ultrasound unless you become symptomatic.  You are referred for an ultrasound of your liver due to an elevated alkaline phosphatase level which may  reflect liver or biliary problems.   You are also referred to the gastroenterologist for further evaluation.  Cholesterol has improved slightly, continue lean green low-fat diet.congratulations on improvement  Continue regular exercise   Keep up healthy habits, and " enjoy upcoming retirement"  Fasting blood sugar is within normal range and your weight is witin a healthy range  Thanks for choosing [facility], we consider it a privelige to serve you.

## 2023-03-01 NOTE — Progress Notes (Signed)
    Gina Nelson     MRN: [id number]      DOB: [birth year]  Chief Complaint  Patient presents with   Annual Exam    Cpe     HPI: Patient is in for annual physical exam. No other health concerns are expressed or addressed at the visit. Recent labs,  are reviewed. Immunization is reviewed , and  updated if needed.   PE: BP (!) 126/58 (BP Location: Left Arm, Patient Position: Sitting, Cuff Size: Normal)   Pulse 71   Ht 5\' 5"  (1.651 m)   Wt 167 lb 1.3 oz (75.8 kg)   SpO2 97%   BMI 27.80 kg/m   Pleasant  female, alert and oriented x 3, in no cardio-pulmonary distress. Afebrile. HEENT No facial trauma or asymetry. Sinuses non tender.  Extra occullar muscles intact.. External ears normal, . Neck: supple, no adenopathy,JVD or thyromegaly.No bruits.  Chest: Clear to ascultation bilaterally.No crackles or wheezes. Non tender to palpation    Cardiovascular system; Heart sounds normal,  S1 and  S2 ,no S3.  No murmur, or thrill. Apical beat not displaced Peripheral pulses normal.  Abdomen: Soft, non tender, no organomegaly or masses. No bruits. Bowel sounds normal. No guarding, tenderness or rebound.    Musculoskeletal exam: Full ROM of spine, hips , shoulders and knees. No deformity ,swelling or crepitus noted. No muscle wasting or atrophy.   Neurologic: Cranial nerves 2 to 12 intact. Power, tone ,sensation and reflexes normal throughout. No disturbance in gait. No tremor.  Skin: Intact, no ulceration, erythema , scaling or rash noted. Pigmentation normal throughout  Psych; Normal mood and affect. Judgement and concentration normal   Assessment & Plan:  Encounter for Medicare annual examination with abnormal findings Annual exam as documented. . Immunization and cancer screening needs are specifically addressed at this visit.   Right thyroid nodule Rept [place] to ensure 5 year stability per radiology rec  Elevated alkaline phosphatase level US liver  and refer to gI

## 2023-03-02 ENCOUNTER — Encounter (INDEPENDENT_AMBULATORY_CARE_PROVIDER_SITE_OTHER): Payer: Self-pay | Admitting: *Deleted

## 2023-03-03 ENCOUNTER — Encounter (INDEPENDENT_AMBULATORY_CARE_PROVIDER_SITE_OTHER): Payer: Medicare PPO | Admitting: Gastroenterology

## 2023-03-12 ENCOUNTER — Ambulatory Visit (HOSPITAL_COMMUNITY)
Admission: RE | Admit: 2023-03-12 | Discharge: 2023-03-12 | Disposition: A | Discharge status: Home / Self Care | Payer: Medicare PPO | Source: Ambulatory Visit | Attending: Family Medicine | Admitting: Family Medicine

## 2023-03-12 DIAGNOSIS — K7689 Other specified diseases of liver: Secondary | ICD-10-CM | POA: Diagnosis not present

## 2023-03-12 DIAGNOSIS — E041 Nontoxic single thyroid nodule: Secondary | ICD-10-CM

## 2023-03-12 DIAGNOSIS — R748 Abnormal levels of other serum enzymes: Secondary | ICD-10-CM | POA: Diagnosis not present

## 2023-03-26 ENCOUNTER — Encounter (INDEPENDENT_AMBULATORY_CARE_PROVIDER_SITE_OTHER): Payer: Self-pay | Admitting: Gastroenterology

## 2023-03-26 ENCOUNTER — Ambulatory Visit (INDEPENDENT_AMBULATORY_CARE_PROVIDER_SITE_OTHER): Payer: Medicare PPO | Admitting: Gastroenterology

## 2023-03-26 VITALS — BP 125/78 | HR 63 | Temp 97.1°F | Ht 65.0 in | Wt 166.4 lb

## 2023-03-26 DIAGNOSIS — R748 Abnormal levels of other serum enzymes: Secondary | ICD-10-CM

## 2023-03-26 DIAGNOSIS — R09A2 Foreign body sensation, throat: Secondary | ICD-10-CM | POA: Diagnosis not present

## 2023-03-26 NOTE — Patient Instructions (Signed)
 Perform blood workup Continue Elavil 10 mg bedtime

## 2023-03-26 NOTE — Progress Notes (Signed)
 Gina Nelson, M.D. Gastroenterology & Hepatology [facility] [address] [place], [place] [address]  Primary Care Physician: Kerri Perches, MD [address] [place] [address]  I will communicate my assessment and recommendations to the referring MD via EMR.  Problems: Globus Elevated alk phos  History of Present Illness: Gina Nelson is a 73 y.o. female  with past medical history of arthritis, right breast cancer, globus sensation, who presents for elevated alkaline phosphatase.  The patient was last seen on 11/10/2022. At that time, the patient was advised to continue Elavil at bedtime.  Patient was advised to follow-up with our office earlier by her PCP as she was found to have elevation in her alkaline phosphatase.  2 years ago her alkaline phosphatase was 124.  Last year on 02/02/2022 this value was 160.  Repeat labs from 02/19/2023 showed an alkaline phosphatase of 190.  AST was 29, ALT 16, albumin 4.2, total bilirubin 0.4.  CBC with platelets 189, hemoglobin 12.0, WBC 7.6, TSH 1.87.  Right upper quadrant ultrasound on 03/12/2023 showed changes suggestive of fatty liver.  Non complaint today. The patient denies having any nausea, vomiting, fever, chills, hematochezia, melena, hematemesis, abdominal distention, abdominal pain, diarrhea, jaundice, pruritus or weight loss.  No recent changes in medication.  Still taking Elavil 10 mg, which helps with globus.  Last Colonoscopy:04/25/2021 Diminutive polyp in the proximal transverse colon (hyperplastic polyp) diverticulosis, external hemorrhoids. Advised to not repeat colonoscopy given age Last Endoscopy:06/2022 - No endoscopic esophageal abnormality to explain                            patient's dysphagia. Esophagus dilated. Dilated.                           - Z-line irregular, 37 cm from the incisors.                            Biopsied.                            - 2 cm hiatal hernia.                           - Normal stomach.                           - Normal examined duodenum.   Pathology showed normal GE junction, no need to repeat EGD   Past Medical History: Past Medical History:  Diagnosis Date   Allergy    Arthritis    knee   Blood transfusion    Cancer (HCC)    right breast T1cN2   GERD (gastroesophageal reflux disease)    PONV (postoperative nausea and vomiting)     Past Surgical History: Past Surgical History:  Procedure Laterality Date   ABDOMINAL HYSTERECTOMY  1984   BIOPSY  12/12/2016   Procedure: BIOPSY;  Surgeon: Malissa Hippo, MD;  [facility];  Service: Endoscopy;;  gastric   BIOPSY  04/25/2021   Procedure: BIOPSY;  Surgeon: Malissa Hippo, MD;  [facility];  Service: Endoscopy;;   BIOPSY  06/24/2022   Procedure: BIOPSY;  Surgeon: Dolores Frame, MD;  [facility];  Service: Gastroenterology;;   BREAST SURGERY  right MRM, left simple mastectomy   2012   BREAST SURGERY  left mastectomy   2012   COLONOSCOPY  05/07/2011   Procedure: COLONOSCOPY;  Surgeon: Malissa Hippo, MD;  [facility];  Service: Endoscopy;  Laterality: N/A;  830   COLONOSCOPY WITH PROPOFOL N/A 04/25/2021   Procedure: COLONOSCOPY WITH PROPOFOL;  Surgeon: Malissa Hippo, MD;  [facility];  Service: Endoscopy;  Laterality: N/A;  10:00   ESOPHAGEAL DILATION N/A 06/24/2022   Procedure: ESOPHAGEAL DILATION;  Surgeon: Dolores Frame, MD;  [facility];  Service: Gastroenterology;  Laterality: N/A;  2:30;asa 3   ESOPHAGOGASTRODUODENOSCOPY N/A 12/12/2016   Procedure: ESOPHAGOGASTRODUODENOSCOPY (EGD);  Surgeon: Malissa Hippo, MD;  [facility];  Service: Endoscopy;  Laterality: N/A;  9:15   ESOPHAGOGASTRODUODENOSCOPY (EGD) WITH PROPOFOL N/A 06/24/2022   Procedure: ESOPHAGOGASTRODUODENOSCOPY (EGD) WITH PROPOFOL;  Surgeon: Dolores Frame, MD;   [facility];  Service: Gastroenterology;  Laterality: N/A;  to arrive at 0715.   HEMORRHOID SURGERY N/A 11/27/2022   Procedure: HEMORRHOIDECTOMY, SIMPLE;  Surgeon: Lewie Chamber, DO;  Location: [facility];  Service: General;  Laterality: N/A;   JOINT REPLACEMENT  11/2009   right knee   POLYPECTOMY  04/25/2021   Procedure: POLYPECTOMY;  Surgeon: Malissa Hippo, MD;  [facility];  Service: Endoscopy;;   PORT-A-CATH REMOVAL  07/16/10   TOTAL KNEE ARTHROPLASTY  2011    Family History: Family History  Problem Relation Age of Onset   Prostate cancer Father    Pancreatic cancer Sister    Pneumonia Mother    Hypertension Daughter     Social History: Social History   Tobacco Use  Smoking Status Never  Smokeless Tobacco Never   Social History   Substance and Sexual Activity  Alcohol Use No   Social History   Substance and Sexual Activity  Drug Use No    Allergies: No Known Allergies  Medications: Current Outpatient Medications  Medication Sig Dispense Refill   bisacodyl (DULCOLAX) 5 MG EC tablet Take 5 mg by mouth every other day.     cholecalciferol (VITAMIN D3) 25 MCG (1000 UNIT) tablet Take 1,000 Units by mouth 4 (four) times a week.     clotrimazole-betamethasone (LOTRISONE) cream Apply 1 Application topically 2 (two) times daily as needed (irritation behind ears due to eye glasses).     loratadine (CLARITIN) 10 MG tablet Take 1 tablet (10 mg total) by mouth daily as needed for allergies. 30 tablet 5   meclizine (ANTIVERT) 12.5 MG tablet TAKE 1 TABLET BY MOUTH THREE TIMES DAILY AS NEEDED FOR DIZZINESS 21 tablet 0   meloxicam (MOBIC) 7.5 MG tablet Take one tablet by mouth once daily , as needed, for arthritic pain 30 tablet 0   ondansetron (ZOFRAN) 4 MG tablet Take 1 tablet (4 mg total) by mouth daily as needed for nausea or vomiting. 30 tablet 1   senna-docusate (SENOKOT-S) 8.6-50 MG tablet Take 1 tablet by mouth 2 (two) times daily. 60 tablet  3   No current facility-administered medications for this visit.    Review of Systems: GENERAL: negative for malaise, night sweats HEENT: No changes in hearing or vision, no nose bleeds or other nasal problems. NECK: Negative for lumps, goiter, pain and significant neck swelling RESPIRATORY: Negative for cough, wheezing CARDIOVASCULAR: Negative for chest pain, leg swelling, palpitations, orthopnea GI: SEE HPI MUSCULOSKELETAL:  Negative for joint pain or swelling, back pain, and muscle pain. SKIN: Negative for lesions, rash PSYCH: Negative for sleep disturbance, mood disorder and recent psychosocial stressors. HEMATOLOGY Negative for prolonged bleeding, bruising easily, and swollen nodes. ENDOCRINE: Negative for cold or heat intolerance, polyuria, polydipsia and goiter. NEURO: negative for tremor, gait imbalance, syncope and seizures. The remainder of the review of systems is noncontributory.   Physical Exam: BP 125/78   Pulse 63   Temp (!) 97.1 F (36.2 C)   Ht 5\' 5"  (1.651 m)   Wt 166 lb 6.4 oz (75.5 kg)   BMI 27.69 kg/m  GENERAL: The patient is AO x3, in no acute distress. HEENT: Head is normocephalic and atraumatic. EOMI are intact. Mouth is well hydrated and without lesions. NECK: Supple. No masses LUNGS: Clear to auscultation. No presence of rhonchi/wheezing/rales. Adequate chest expansion HEART: RRR, normal s1 and s2. ABDOMEN: Soft, nontender, no guarding, no peritoneal signs, and nondistended. BS +. No masses. EXTREMITIES: Without any cyanosis, clubbing, rash, lesions or edema. NEUROLOGIC: AOx3, no focal motor deficit. SKIN: no jaundice, no rashes  Imaging/Labs: as above  I personally reviewed and interpreted the available labs, imaging and endoscopic files.  Impression and Plan: Gina Nelson is a 73 y.o. female  with past medical history of arthritis, right breast cancer, globus sensation, who presents for elevated alkaline phosphatase.  Patient was found to  have mild elevation of alkaline phosphatase during the last 2 years.  She has been completely asymptomatic and has not changed medications recently.  We discussed the need to check serologies to evaluate for an elevation coming from a bone source and evaluation for PBC.  Lastly, her globus has been well-controlled with the use of low-dose Elavil, which she should continue for now.  -Check CMP, alkaline phosphatase isoenzymes, IgM and AMA -Continue Elavil 10 mg bedtime  All questions were answered.      Gina Blazing, MD Gastroenterology and Hepatology [facility]

## 2023-03-30 LAB — COMPREHENSIVE METABOLIC PANEL
ALT: 11 IU/L (ref 0–32)
AST: 23 IU/L (ref 0–40)
Albumin: 4.6 g/dL (ref 3.8–4.8)
Alkaline Phosphatase: 198 IU/L — ABNORMAL HIGH (ref 44–121)
BUN/Creatinine Ratio: 9 — ABNORMAL LOW (ref 12–28)
BUN: 8 mg/dL (ref 8–27)
Bilirubin Total: 0.4 mg/dL (ref 0.0–1.2)
CO2: 24 mmol/L (ref 20–29)
Calcium: 9.6 mg/dL (ref 8.7–10.3)
Chloride: 106 mmol/L (ref 96–106)
Creatinine, Ser: 0.94 mg/dL (ref 0.57–1.00)
Globulin, Total: 2.4 g/dL (ref 1.5–4.5)
Glucose: 82 mg/dL (ref 70–99)
Potassium: 4.2 mmol/L (ref 3.5–5.2)
Sodium: 145 mmol/L — ABNORMAL HIGH (ref 134–144)
Total Protein: 7 g/dL (ref 6.0–8.5)
eGFR: 64 mL/min/{1.73_m2} (ref >59)

## 2023-03-30 LAB — IGM: IgM (Immunoglobulin M), Srm: 77 mg/dL (ref 26–217)

## 2023-03-30 LAB — ALKALINE PHOSPHATASE, ISOENZYMES
BONE FRACTION: 44 % (ref 14–68)
INTESTINAL FRAC.: 0 % (ref 0–18)
LIVER FRACTION: 56 % (ref 18–85)

## 2023-03-30 LAB — MITOCHONDRIAL ANTIBODIES: Mitochondrial Ab: <20 U (ref 0.0–20.0)

## 2023-04-06 ENCOUNTER — Other Ambulatory Visit (INDEPENDENT_AMBULATORY_CARE_PROVIDER_SITE_OTHER): Payer: Self-pay

## 2023-04-06 ENCOUNTER — Encounter (INDEPENDENT_AMBULATORY_CARE_PROVIDER_SITE_OTHER): Payer: Self-pay

## 2023-04-07 ENCOUNTER — Telehealth (INDEPENDENT_AMBULATORY_CARE_PROVIDER_SITE_OTHER): Payer: Self-pay | Admitting: Gastroenterology

## 2023-04-07 NOTE — Telephone Encounter (Signed)
 Pt said she was returning a call. She thinks it was about her lab results. [phone]

## 2023-04-07 NOTE — Telephone Encounter (Signed)
 Tried calling vm full. Please see results note from 03/26/2023. I will try again to reach patient.

## 2023-06-06 ENCOUNTER — Other Ambulatory Visit: Payer: Self-pay | Admitting: Family Medicine

## 2023-07-09 ENCOUNTER — Encounter: Payer: Self-pay | Admitting: Family Medicine

## 2023-07-09 ENCOUNTER — Ambulatory Visit: Admitting: Family Medicine

## 2023-07-09 VITALS — BP 114/77 | HR 73 | Resp 16 | Ht 65.0 in | Wt 163.1 lb

## 2023-07-09 DIAGNOSIS — R0789 Other chest pain: Secondary | ICD-10-CM | POA: Diagnosis not present

## 2023-07-09 DIAGNOSIS — R748 Abnormal levels of other serum enzymes: Secondary | ICD-10-CM

## 2023-07-09 NOTE — Patient Instructions (Signed)
 F/U as before  For chest wwall pain, take meloxicam  that you already have one tablet once daily for 5 days  Lab today and I will forward to your GI Doc, as you report yellow urine  Thanks for choosing [facility], we consider it a privelige to serve you.

## 2023-07-09 NOTE — Assessment & Plan Note (Signed)
 Reports yellow urine, will repeat hepatic panel today.

## 2023-07-10 ENCOUNTER — Ambulatory Visit: Payer: Self-pay | Admitting: Family Medicine

## 2023-07-10 LAB — HEPATIC FUNCTION PANEL
ALT: 7 IU/L (ref 0–32)
AST: 20 IU/L (ref 0–40)
Albumin: 4 g/dL (ref 3.8–4.8)
Alkaline Phosphatase: 184 IU/L — ABNORMAL HIGH (ref 44–121)
Bilirubin Total: 0.4 mg/dL (ref 0.0–1.2)
Bilirubin, Direct: 0.13 mg/dL (ref 0.00–0.40)
Total Protein: 6.8 g/dL (ref 6.0–8.5)

## 2023-07-12 NOTE — Assessment & Plan Note (Signed)
 Muscular skeletal pt to take meloxicam  one daily for the next 5 days , she already has the medication

## 2023-07-12 NOTE — Progress Notes (Signed)
   Gina Nelson     MRN: [id number]      DOB: [birth year]  Chief Complaint  Patient presents with   Chest Pain    Pt complains of pain in her chest muscles since yesterday. States it only happens when she moves certain ways. 6/10 pain. States she has experienced this about a year and a half ago    HPI Gina Nelson is here wih left upper chest pain agravated by certain  movements. States pain started one day ago while making love, this has happened once in the past before . Denies cough, fever, shortness of breath or hemoptysis  ROS Denies recent fever or chills. Denies sinus pressure, nasal congestion, ear pain or sore throat. Denies chest congestion, productive cough or wheezing. Denies chest pains, palpitations and leg swelling n.   Denies dysuria, frequency, hesitancy or incontinence. Denies joint pain, swelling and limitation in mobility. Denies headaches, seizures, numbness, or tingling. Denies depression, anxiety or insomnia. Denies skin break down or rash.   PE  BP 114/77   Pulse 73   Resp 16   Ht 5' 5 (1.651 m)   Wt 163 lb 1.9 oz (74 kg)   SpO2 96%   BMI 27.14 kg/m   Patient alert and oriented and in no cardiopulmonary distress.  HEENT: No facial asymmetry, EOMI,     Neck supple .  Chest: Clear to auscultation bilaterally.Tender to palpation over left anterior chest in area of acromioclavicular junction  CVS: S1, S2 no murmurs, no S3.Regular rate.  ABD: Soft non tender.   Ext: No edema  MS: Adequate ROM spine, shoulders, hips and knees.  Skin: Intact, no ulcerations or rash noted.  Psych: Good eye contact, normal affect. Memory intact not anxious or depressed appearing.  CNS: CN 2-12 intact, power,  normal throughout.no focal deficits noted.   Assessment & Plan  Elevated alkaline phosphatase level Reports yellow urine, will repeat hepatic panel today.  Chest wall pain Muscular skeletal pt to take meloxicam  one daily for the next 5 days , she  already has the medication

## 2023-07-24 ENCOUNTER — Other Ambulatory Visit (INDEPENDENT_AMBULATORY_CARE_PROVIDER_SITE_OTHER): Payer: Self-pay

## 2023-07-24 DIAGNOSIS — R748 Abnormal levels of other serum enzymes: Secondary | ICD-10-CM

## 2023-07-31 ENCOUNTER — Encounter: Payer: Self-pay | Admitting: Family Medicine

## 2023-07-31 ENCOUNTER — Ambulatory Visit: Payer: Medicare PPO | Admitting: Family Medicine

## 2023-07-31 VITALS — BP 124/76 | HR 73 | Resp 18 | Ht 65.0 in | Wt 166.0 lb

## 2023-07-31 DIAGNOSIS — Z1329 Encounter for screening for other suspected endocrine disorder: Secondary | ICD-10-CM

## 2023-07-31 DIAGNOSIS — E663 Overweight: Secondary | ICD-10-CM | POA: Diagnosis not present

## 2023-07-31 DIAGNOSIS — E559 Vitamin D deficiency, unspecified: Secondary | ICD-10-CM

## 2023-07-31 DIAGNOSIS — K5904 Chronic idiopathic constipation: Secondary | ICD-10-CM

## 2023-07-31 DIAGNOSIS — Z13220 Encounter for screening for lipoid disorders: Secondary | ICD-10-CM | POA: Diagnosis not present

## 2023-07-31 DIAGNOSIS — R748 Abnormal levels of other serum enzymes: Secondary | ICD-10-CM | POA: Diagnosis not present

## 2023-07-31 DIAGNOSIS — R09A2 Foreign body sensation, throat: Secondary | ICD-10-CM | POA: Diagnosis not present

## 2023-07-31 DIAGNOSIS — R0789 Other chest pain: Secondary | ICD-10-CM | POA: Diagnosis not present

## 2023-07-31 MED ORDER — CLOTRIMAZOLE-BETAMETHASONE 1-0.05 % EX CREA: 1.0000 | TOPICAL_CREAM | Freq: Two times a day (BID) | CUTANEOUS | 3 refills | Status: DC | PRN

## 2023-07-31 NOTE — Patient Instructions (Addendum)
 Annual exam in February    Call for flu vaccine end August / early September  Fasting CBC, lipid, cmp and EgFR, TSH, vit D 3 to 7 days before feb  appointment  Keep up great health habits, please just put in 3  more days of exercise for 30 mins each day  Medications are as listed  Thanks for choosing [facility], we consider it a privelige to serve you.

## 2023-08-02 ENCOUNTER — Encounter: Payer: Self-pay | Admitting: Family Medicine

## 2023-08-02 DIAGNOSIS — K5900 Constipation, unspecified: Secondary | ICD-10-CM | POA: Insufficient documentation

## 2023-08-02 DIAGNOSIS — Z13220 Encounter for screening for lipoid disorders: Secondary | ICD-10-CM | POA: Insufficient documentation

## 2023-08-02 DIAGNOSIS — Z1329 Encounter for screening for other suspected endocrine disorder: Secondary | ICD-10-CM | POA: Insufficient documentation

## 2023-08-02 NOTE — Assessment & Plan Note (Signed)
 Controlled on elevil per GI continue same

## 2023-08-02 NOTE — Assessment & Plan Note (Signed)
 Updated lab needed at/ before next visit.

## 2023-08-02 NOTE — Assessment & Plan Note (Signed)
 resolved

## 2023-08-02 NOTE — Progress Notes (Signed)
   Gina Nelson     MRN: [id number]      DOB: 28-Oct-1950  Chief Complaint  Patient presents with   Follow-up    6 month follow up     HPI Gina Nelson is here for follow up and re-evaluation of chronic medical conditions, medication management and review of any available recent lab and radiology data.  Preventive health is updated, specifically  Cancer screening and Immunization.   Questions or concerns regarding consultations or procedures which the PT has had in the interim are  addressed.Reviewed current workup of elevated alk phos by GI, may need liver biopsy depending on ept lab  The PT denies any adverse reactions to current medications since the last visit.  There are no new concerns.  There are no specific complaints   ROS Denies recent fever or chills. Denies sinus pressure, nasal congestion, ear pain or sore throat. Denies chest congestion, productive cough or wheezing. Denies chest pains, palpitations and leg swelling Denies abdominal pain, nausea, vomiting,diarrhea or constipation.   Denies dysuria, frequency, hesitancy or incontinence. Denies joint pain, swelling and limitation in mobility. Denies headaches, seizures, numbness, or tingling. Denies depression, anxiety or insomnia. Denies skin break down or rash.   PE  BP 124/76   Pulse 73   Resp 18   Ht 5' 5 (1.651 m)   Wt 166 lb 0.6 oz (75.3 kg)   SpO2 98%   BMI 27.63 kg/m   Patient alert and oriented and in no cardiopulmonary distress.  HEENT: No facial asymmetry, EOMI,     Neck supple .  Chest: Clear to auscultation bilaterally.  CVS: S1, S2 no murmurs, no S3.Regular rate.  ABD: Soft non tender.   Ext: No edema  MS: Adequate ROM spine, shoulders, hips and knees.  Skin: Intact, no ulcerations or rash noted.  Psych: Good eye contact, normal affect. Memory intact not anxious or depressed appearing.  CNS: CN 2-12 intact, power,  normal throughout.no focal deficits noted.   Assessment &  Plan  Globus sensation Controlled on elevil per GI continue same  Elevated alkaline phosphatase level Being evaluated by GI  Chest wall pain resolved  Screening for lipoid disorders Updated lab needed at/ before next visit.   Vitamin D  deficiency Updated lab needed at/ before next visit.   Overweight (BMI 25.0-29.9)  Patient re-educated about  the importance of commitment to a  minimum of 150 minutes of exercise per week as able.  The importance of healthy food choices with portion control discussed, as well as eating regularly and within a 12 hour window most days. The need to choose clean , green food 50 to 75% of the time is discussed, as well as to make water  the primary drink and set a goal of 64 ounces water  daily.       07/31/2023   10:00 AM 07/09/2023    1:27 PM 03/26/2023   11:59 AM  Weight /BMI  Weight 166 lb 0.6 oz 163 lb 1.9 oz 166 lb 6.4 oz  Height 5' 5 (1.651 m) 5' 5 (1.651 m) 5' 5 (1.651 m)  BMI 27.63 kg/m2 27.14 kg/m2 27.69 kg/m2    Unchanged  Constipation Controlled, no change in medication

## 2023-08-02 NOTE — Assessment & Plan Note (Signed)
  Patient re-educated about  the importance of commitment to a  minimum of 150 minutes of exercise per week as able.  The importance of healthy food choices with portion control discussed, as well as eating regularly and within a 12 hour window most days. The need to choose clean , green food 50 to 75% of the time is discussed, as well as to make water  the primary drink and set a goal of 64 ounces water  daily.       07/31/2023   10:00 AM 07/09/2023    1:27 PM 03/26/2023   11:59 AM  Weight /BMI  Weight 166 lb 0.6 oz 163 lb 1.9 oz 166 lb 6.4 oz  Height 5' 5 (1.651 m) 5' 5 (1.651 m) 5' 5 (1.651 m)  BMI 27.63 kg/m2 27.14 kg/m2 27.69 kg/m2    Unchanged

## 2023-08-02 NOTE — Assessment & Plan Note (Signed)
 Being evaluated by GI

## 2023-08-02 NOTE — Assessment & Plan Note (Signed)
 Controlled, no change in medication

## 2023-08-03 ENCOUNTER — Other Ambulatory Visit: Payer: Self-pay | Admitting: Family Medicine

## 2023-08-03 ENCOUNTER — Telehealth: Payer: Self-pay | Admitting: Family Medicine

## 2023-08-03 MED ORDER — BETAMETHASONE DIPROPIONATE 0.05 % EX CREA: TOPICAL_CREAM | CUTANEOUS | 1 refills | Status: DC

## 2023-08-03 NOTE — Telephone Encounter (Signed)
 Copied from CRM [id number]. Topic: Clinical - Medication Refill >> Aug 03, 2023 10:57 AM Aleatha C wrote: Medication: betamethasone  Dipropionate Cream  Has the patient contacted their pharmacy? No (Agent: If no, request that the patient contact the pharmacy for the refill. If patient does not wish to contact the pharmacy document the reason why and proceed with request.) (Agent: If yes, when and what did the pharmacy advise?)  This is the patient's preferred pharmacy:  [facility] [address] [place] - [address] [place] [address] Phone: [phone] Fax: [phone]  Is this the correct pharmacy for this prescription? Yes If no, delete pharmacy and type the correct one.   Has the prescription been filled recently? Yes but she received the wrong cream  Is the patient out of the medication? No  Has the patient been seen for an appointment in the last year OR does the patient have an upcoming appointment? Yes  Can we respond through MyChart? No  Agent: Please be advised that Rx refills may take up to 3 business days. We ask that you follow-up with your pharmacy.

## 2023-08-03 NOTE — Progress Notes (Signed)
Refill sent for betamethasone

## 2023-08-03 NOTE — Telephone Encounter (Signed)
 Refill sent.

## 2023-08-03 NOTE — Telephone Encounter (Signed)
 FYI Only or Action Required?: Action required by provider: medication refill request. For Betamethasone  Dipropionate Cream Patient was last seen in primary care on 07/31/2023 by Antonetta Rollene BRAVO, MD.  Called Nurse Triage reporting No chief complaint on file..  Symptoms began today.  Interventions attempted: Nothing.  Symptoms are: stable.  Triage Disposition: No disposition on file.  Patient/caregiver understands and will follow disposition?:

## 2023-08-06 ENCOUNTER — Other Ambulatory Visit: Payer: Self-pay | Admitting: Family Medicine

## 2023-08-07 ENCOUNTER — Other Ambulatory Visit: Payer: Self-pay | Admitting: Family Medicine

## 2023-08-10 ENCOUNTER — Telehealth: Payer: Self-pay | Admitting: Family Medicine

## 2023-08-10 MED ORDER — MELOXICAM 7.5 MG PO TABS: ORAL_TABLET | ORAL | 0 refills | Status: DC

## 2023-08-10 NOTE — Telephone Encounter (Signed)
 Prescription Request  08/10/2023  LOV: 07/31/2023  What is the name of the medication or equipment? Meloxicam  7.5 mg  Have you contacted your pharmacy to request a refill? Yes   Which pharmacy would you like this sent to?  [facility] [address], [place] - [address] [place] [address] Phone: [phone] Fax: [phone]    Patient notified that their request is being sent to the clinical staff for review and that they should receive a response within 2 business days.   Please advise at patient walked into the office

## 2023-08-24 ENCOUNTER — Ambulatory Visit: Payer: Medicare PPO

## 2023-08-24 VITALS — Ht 65.0 in | Wt 163.0 lb

## 2023-08-24 DIAGNOSIS — Z0000 Encounter for general adult medical examination without abnormal findings: Secondary | ICD-10-CM

## 2023-08-24 DIAGNOSIS — Z780 Asymptomatic menopausal state: Secondary | ICD-10-CM

## 2023-08-24 NOTE — Progress Notes (Addendum)
 Subjective:   Gina Nelson is a 73 y.o. who presents for a Medicare Wellness preventive visit.  As a reminder, Annual Wellness Visits don't include a physical exam, and some assessments may be limited, especially if this visit is performed virtually. We may recommend an in-person follow-up visit with your provider if needed.  Visit Complete: Virtual I connected with  Gina Nelson on 08/24/23 by a audio enabled telemedicine application and verified that I am speaking with the correct person using two identifiers.  Patient Location: Home  Provider Location: Home Office  I discussed the limitations of evaluation and management by telemedicine. The patient expressed understanding and agreed to proceed.  Vital Signs: Because this visit was a virtual/telehealth visit, some criteria may be missing or patient reported. Any vitals not documented were not able to be obtained and vitals that have been documented are patient reported.  VideoDeclined- This patient declined Librarian, academic. Therefore the visit was completed with audio only.  Persons Participating in Visit: Patient.  AWV Questionnaire: No: Patient Medicare AWV questionnaire was not completed prior to this visit.  Cardiac Risk Factors include: advanced age (>53men, >21 women);dyslipidemia;hypertension     Objective:    Today's Vitals   08/24/23 1138 08/24/23 1139  Weight: 163 lb (73.9 kg)   Height: 5' 5 (1.651 m)   PainSc:  0-No pain   Body mass index is 27.12 kg/m.     08/24/2023   11:37 AM 11/27/2022    8:22 AM 11/25/2022    8:17 AM 08/20/2022    8:24 AM 06/24/2022    7:50 AM 06/23/2022    9:38 AM 08/12/2021   10:12 AM  Advanced Directives  Does Patient Have a Medical Advance Directive? No No No Yes No No No  Type of Theme park manager;Living will     Does patient want to make changes to medical advance directive?    Yes (Inpatient - patient defers  changing a medical advance directive and declines information at this time)     Copy of Healthcare Power of Attorney in Chart?    No - copy requested     Would patient like information on creating a medical advance directive? No - Patient declined No - Patient declined No - Patient declined  No - Patient declined No - Patient declined No - Patient declined    Current Medications (verified) Outpatient Encounter Medications as of 08/24/2023  Medication Sig   amitriptyline  (ELAVIL ) 10 MG tablet Take 1 tablet (10 mg total) by mouth at bedtime.   betamethasone  dipropionate 0.05 % cream APPLY A FINGER TIP AMOUNT TWICE WEEKLY AS NEEDED FOR DISCOMFORT   bisacodyl  (DULCOLAX) 5 MG EC tablet Take 5 mg by mouth every other day.   cholecalciferol (VITAMIN D3) 25 MCG (1000 UNIT) tablet Take 1,000 Units by mouth 4 (four) times a week.   clotrimazole -betamethasone  (LOTRISONE ) cream Apply 1 Application topically 2 (two) times daily as needed (irritation behind ears due to eye glasses).   EQ ALL DAY ALLERGY RELIEF 10 MG tablet TAKE 1 TABLET BY MOUTH ONCE DAILY AS NEEDED FOR ALLERGIES   meclizine  (ANTIVERT ) 12.5 MG tablet TAKE 1 TABLET BY MOUTH THREE TIMES DAILY AS NEEDED FOR DIZZINESS   meloxicam  (MOBIC ) 7.5 MG tablet Take one tablet once daily , as needed, for pain   senna-docusate (SENOKOT-S) 8.6-50 MG tablet Take 1 tablet by mouth 2 (two) times daily.   No facility-administered encounter medications on  file as of 08/24/2023.    Allergies (verified) Patient has no known allergies.   History: Past Medical History:  Diagnosis Date   Allergy    Arthritis    knee   Blood transfusion    Cancer (HCC)    right breast T1cN2   External hemorrhoid 11/27/2022   GERD (gastroesophageal reflux disease)    PONV (postoperative nausea and vomiting)    Past Surgical History:  Procedure Laterality Date   ABDOMINAL HYSTERECTOMY  1984   BIOPSY  12/12/2016   Procedure: BIOPSY;  Surgeon: Golda Claudis PENNER, MD;   [facility];  Service: Endoscopy;;  gastric   BIOPSY  04/25/2021   Procedure: BIOPSY;  Surgeon: Golda Claudis PENNER, MD;  [facility]: [facility];  Service: Endoscopy;;   BIOPSY  06/24/2022   Procedure: BIOPSY;  Surgeon: Eartha Flavors, Toribio, MD;  Location: [facility];  Service: Gastroenterology;;   BREAST SURGERY  right MRM, left simple mastectomy   2012   BREAST SURGERY  left mastectomy   2012   COLONOSCOPY  05/07/2011   Procedure: COLONOSCOPY;  Surgeon: Claudis PENNER Golda, MD;  Location: [facility];  Service: Endoscopy;  Laterality: N/A;  830   COLONOSCOPY WITH PROPOFOL  N/A 04/25/2021   Procedure: COLONOSCOPY WITH PROPOFOL ;  Surgeon: Golda Claudis PENNER, MD;  [facility]: [facility];  Service: Endoscopy;  Laterality: N/A;  10:00   ESOPHAGEAL DILATION N/A 06/24/2022   Procedure: ESOPHAGEAL DILATION;  Surgeon: Eartha Flavors Toribio, MD;  [facility]: [facility];  Service: Gastroenterology;  Laterality: N/A;  2:30;asa 3   ESOPHAGOGASTRODUODENOSCOPY N/A 12/12/2016   Procedure: ESOPHAGOGASTRODUODENOSCOPY (EGD);  Surgeon: Golda Claudis PENNER, MD;  [facility]: [facility];  Service: Endoscopy;  Laterality: N/A;  9:15   ESOPHAGOGASTRODUODENOSCOPY (EGD) WITH PROPOFOL  N/A 06/24/2022   Procedure: ESOPHAGOGASTRODUODENOSCOPY (EGD) WITH PROPOFOL ;  Surgeon: Eartha Flavors Toribio, MD;  [facility]: [facility];  Service: Gastroenterology;  Laterality: N/A;  to arrive at 0715.   HEMORRHOID SURGERY N/A 11/27/2022   Procedure: HEMORRHOIDECTOMY, SIMPLE;  Surgeon: Evonnie Dorothyann LABOR, DO;  Location: [facility];  Service: General;  Laterality: N/A;   JOINT REPLACEMENT  11/2009   right knee   POLYPECTOMY  04/25/2021   Procedure: POLYPECTOMY;  Surgeon: Golda Claudis PENNER, MD;  [facility]: [facility];  Service: Endoscopy;;   PORT-A-CATH REMOVAL  07/16/10   TOTAL KNEE ARTHROPLASTY  2011   Family History  Problem Relation Age of Onset   Prostate cancer Father    Pancreatic cancer Sister    Pneumonia  Mother    Hypertension Daughter    Social History   Socioeconomic History   Marital status: Married    Spouse name: Marsha   Number of children: 1   Years of education: Not on file   Highest education level: 12th grade  Occupational History   Occupation: retired  Tobacco Use   Smoking status: Never   Smokeless tobacco: Never  Vaping Use   Vaping status: Never Used  Substance and Sexual Activity   Alcohol use: No   Drug use: No   Sexual activity: Yes    Birth control/protection: Surgical  Other Topics Concern   Not on file  Social History Narrative   Has been married 48 years. Husband is a Optician, dispensing. She is retired.Visits sick people in the hospital. Enjoys word search puzzles.   Social Drivers of Health   Financial Resource Strain: Low Risk  (08/24/2023)   Overall Financial Resource Strain (CARDIA)    Difficulty of  Paying Living Expenses: Not hard at all  Food Insecurity: No Food Insecurity (08/24/2023)   Hunger Vital Sign    Worried About Running Out of Food in the Last Year: Never true    Ran Out of Food in the Last Year: Never true  Transportation Needs: No Transportation Needs (08/24/2023)   PRAPARE - Administrator, Civil Service (Medical): No    Lack of Transportation (Non-Medical): No  Physical Activity: Sufficiently Active (08/24/2023)   Exercise Vital Sign    Days of Exercise per Week: 7 days    Minutes of Exercise per Session: 30 min  Stress: No Stress Concern Present (08/24/2023)   [employer] of Occupational Health - Occupational Stress Questionnaire    Feeling of Stress: Not at all  Social Connections: Socially Integrated (08/24/2023)   Social Connection and Isolation Panel    Frequency of Communication with Friends and Family: More than three times a week    Frequency of Social Gatherings with Friends and Family: Once a week    Attends Religious Services: More than 4 times per year    Active Member of [employer] or Organizations: Yes    Attends  Engineer, structural: More than 4 times per year    Marital Status: Married    Tobacco Counseling Counseling given: Yes    Clinical Intake:  Pre-visit preparation completed: Yes  Pain : No/denies pain Pain Score: 0-No pain     BMI - recorded: 27.12 Nutritional Status: BMI 25 -29 Overweight Nutritional Risks: None Diabetes: No  Lab Results  Component Value Date   HGBA1C 5.5 04/17/2016   HGBA1C 5.6 04/19/2015   HGBA1C 5.9 (H) 12/15/2014     How often do you need to have someone help you when you read instructions, pamphlets, or other written materials from your doctor or pharmacy?: 1 - Never  Interpreter Needed?: No  Information entered by :: Stefano [facility]   Activities of Daily Living     08/24/2023   11:41 AM 11/25/2022    8:25 AM  In your present state of health, do you have any difficulty performing the following activities:  Hearing? 0   Vision? 0   Difficulty concentrating or making decisions? 0   Walking or climbing stairs? 0   Dressing or bathing? 0   Doing errands, shopping? 0 0  Preparing Food and eating ? N   Using the Toilet? N   In the past six months, have you accidently leaked urine? N   Do you have problems with loss of bowel control? N   Managing your Medications? N   Managing your Finances? N   Housekeeping or managing your Housekeeping? N     Patient Care Team: Antonetta Rollene BRAVO, MD as PCP - Diedre Lily Boas, MD as Consulting Physician (General Surgery) Darroll Anes, DO as Consulting Physician (Optometry) Eartha Flavors, Toribio, MD as Consulting Physician (Gastroenterology) Jesus Oliphant, MD as Consulting Physician (Otolaryngology)  I have updated your Care Teams any recent Medical Services you may have received from other providers in the past year.     Assessment:   This is a routine wellness examination for Gina Nelson.  Hearing/Vision screen Hearing Screening - Comments:: Patient denies any hearing  difficulties.   Vision Screening - Comments:: Wears rx glasses - up to date with routine eye exams with  Anes Darroll w/ [facility]    Goals Addressed             This  Visit's Progress    DIET - EAT MORE FRUITS AND VEGETABLES   On track    Exercise 3x per week (30 min per time)   On track    Recommend increasing your routine exercise program at least 5 days a week for 15 minutes at a time as tolerated.       LIFESTYLE - DECREASE FALLS RISK   On track      Depression Screen     08/24/2023   11:43 AM 07/31/2023   10:00 AM 07/09/2023    1:29 PM 02/25/2023   10:10 AM 10/30/2022    9:19 AM 10/07/2022   10:06 AM 08/20/2022    8:23 AM  PHQ 2/9 Scores  PHQ - 2 Score 0 0 0 0 0 0 0  PHQ- 9 Score 0          Fall Risk     08/24/2023   11:40 AM 07/31/2023   10:00 AM 07/09/2023    1:29 PM 02/25/2023   10:10 AM 11/11/2022    2:04 PM  Fall Risk   Falls in the past year? 0 0 0 0 0  Number falls in past yr: 0 0 0 0 0  Injury with Fall? 0 0 0 0   Risk for fall due to : No Fall Risks   No Fall Risks No Fall Risks  Follow up Falls evaluation completed;Education provided;Falls prevention discussed Falls evaluation completed Falls evaluation completed Falls evaluation completed Falls evaluation completed    MEDICARE RISK AT HOME:  Medicare Risk at Home Any stairs in or around the home?: No If so, are there any without handrails?: No Home free of loose throw rugs in walkways, pet beds, electrical cords, etc?: Yes Adequate lighting in your home to reduce risk of falls?: Yes Life alert?: No Use of a cane, walker or w/c?: No Grab bars in the bathroom?: No Shower chair or bench in shower?: Yes Elevated toilet seat or a handicapped toilet?: Yes  TIMED UP AND GO:  Was the test performed?  No  Cognitive Function: 6CIT completed    08/01/2020    8:54 AM  MMSE - Mini Mental State Exam  Not completed: Unable to complete        08/24/2023   11:41 AM 08/20/2022    8:25 AM  08/12/2021   10:13 AM 08/01/2020    8:54 AM 08/01/2019    8:13 AM  6CIT Screen  What Year? 0 points 0 points 0 points 0 points 0 points  What month? 0 points 0 points 0 points 0 points 0 points  What time? 0 points 0 points 0 points 0 points 0 points  Count back from 20 0 points 0 points  0 points 0 points  Months in reverse 0 points 0 points 0 points 0 points 0 points  Repeat phrase 0 points 0 points 0 points 0 points 0 points  Total Score 0 points 0 points  0 points 0 points    Immunizations Immunization History  Administered Date(s) Administered   Fluad Quad(high Dose 65+) 09/14/2018, 09/30/2019, 09/20/2020, 09/20/2021   Fluad Trivalent(High Dose 65+) 10/07/2022   Influenza Whole 11/08/2006   Influenza,inj,Quad PF,6+ Mos 09/22/2012, 09/20/2013, 09/12/2014, 11/05/2015, 10/07/2016, 10/01/2017   Moderna Covid-19 Vaccine Bivalent Booster 20yrs & up 10/21/2021   PFIZER Comirnaty(Gray Top)Covid-19 Tri-Sucrose Vaccine 04/30/2020   PFIZER(Purple Top)SARS-COV-2 Vaccination 02/19/2019, 03/12/2019, 10/20/2019   Pfizer Covid-19 Vaccine Bivalent Booster 7yrs & up 11/08/2020, 05/24/2021   Pneumococcal Conjugate-13 04/24/2015   Pneumococcal  Polysaccharide-23 04/24/2016   Td 08/06/2005   Tdap 09/27/2021   Zoster Recombinant(Shingrix) 07/27/2020, 10/04/2020   Zoster, Live 02/23/2012    Screening Tests Health Maintenance  Topic Date Due   DEXA SCAN  07/14/2021   COVID-19 Vaccine (8 - 2024-25 season) 09/14/2022   INFLUENZA VACCINE  08/14/2023   Medicare Annual Wellness (AWV)  08/23/2024   DTaP/Tdap/Td (3 - Td or Tdap) 09/28/2031   Pneumococcal Vaccine: 50+ Years  Completed   Hepatitis C Screening  Completed   Zoster Vaccines- Shingrix  Completed   Hepatitis B Vaccines  Aged Out   HPV VACCINES  Aged Out   Meningococcal B Vaccine  Aged Out   Colonoscopy  Discontinued    Health Maintenance  Health Maintenance Due  Topic Date Due   DEXA SCAN  07/14/2021   COVID-19 Vaccine (8 -  2024-25 season) 09/14/2022   INFLUENZA VACCINE  08/14/2023   Health Maintenance Items Addressed: DEXA ordered  Additional Screening:  Vision Screening: Recommended annual ophthalmology exams for early detection of glaucoma and other disorders of the eye. Would you like a referral to an eye doctor? No    Dental Screening: Recommended annual dental exams for proper oral hygiene  Community Resource Referral / Chronic Care Management: CRR required this visit?  No   CCM required this visit?  No   Plan:    I have personally reviewed and noted the following in the patient's chart:   Medical and social history Use of alcohol, tobacco or illicit drugs  Current medications and supplements including opioid prescriptions. Patient is not currently taking opioid prescriptions. Functional ability and status Nutritional status Physical activity Advanced directives List of other physicians Hospitalizations, surgeries, and ER visits in previous 12 months Vitals Screenings to include cognitive, depression, and falls Referrals and appointments  In addition, I have reviewed and discussed with patient certain preventive protocols, quality metrics, and best practice recommendations. A written personalized care plan for preventive services as well as general preventive health recommendations were provided to patient.   Hance Caspers, CMA   08/24/2023   After Visit Summary: (Mail) Due to this being a telephonic visit, the after visit summary with patients personalized plan was offered to patient via mail   Notes: Nothing significant to report at this time.

## 2023-08-24 NOTE — Patient Instructions (Signed)
 Gina Nelson , Thank you for taking time out of your busy schedule to complete your Annual Wellness Visit with me. I enjoyed our conversation and look forward to speaking with you again next year. I, as well as your care team,  appreciate your ongoing commitment to your health goals. Please review the following plan we discussed and let me know if I can assist you in the future. Your Game plan/ To Do List    Referrals: Osteoporosis Screening/Yearly Mammogram: Please call the number below to schedule your appt. [facility] Phone: [phone]   Follow up Visits: We will see or speak with you next year for your Next Medicare AWV with our clinical staff  Clinician Recommendations:  Aim for 30 minutes of exercise or brisk walking, 6-8 glasses of water , and 5 servings of fruits and vegetables each day.    Wishing you many blessings and good health during the next year until our next visit.  -Gavin Telford   This is a list of the screenings recommended for you:  Health Maintenance  Topic Date Due   DEXA scan (bone density measurement)  07/14/2021   COVID-19 Vaccine (8 - 2024-25 season) 09/14/2022   Flu Shot  08/14/2023   Medicare Annual Wellness Visit  08/23/2024   DTaP/Tdap/Td vaccine (3 - Td or Tdap) 09/28/2031   Pneumococcal Vaccine for age over 70  Completed   Hepatitis C Screening  Completed   Zoster (Shingles) Vaccine  Completed   Hepatitis B Vaccine  Aged Out   HPV Vaccine  Aged Out   Meningitis B Vaccine  Aged Out   Colon Cancer Screening  Discontinued    Advanced directives: (Declined) Advance directive discussed with you today. Even though you declined this today, please call our office should you change your mind, and we can give you the proper paperwork for you to fill out. Advance Care Planning is important because it:  [x]  Makes sure you receive the medical care that is consistent with your values, goals, and preferences  [x]  It provides guidance to your  family and loved ones and reduces their decisional burden about whether or not they are making the right decisions based on your wishes.  Follow the link provided in your after visit summary or read over the paperwork we have mailed to you to help you started getting your Advance Directives in place. If you need assistance in completing these, please reach out to us  so that we can help you!  See attachments for Preventive Care and Fall Prevention Tips.

## 2023-08-25 ENCOUNTER — Ambulatory Visit (HOSPITAL_COMMUNITY)
Admission: RE | Admit: 2023-08-25 | Discharge: 2023-08-25 | Disposition: A | Discharge status: Home / Self Care | Source: Ambulatory Visit | Attending: Family Medicine | Admitting: Family Medicine

## 2023-08-25 DIAGNOSIS — Z780 Asymptomatic menopausal state: Secondary | ICD-10-CM | POA: Diagnosis not present

## 2023-08-26 ENCOUNTER — Ambulatory Visit: Payer: Self-pay | Admitting: Family Medicine

## 2023-09-03 ENCOUNTER — Telehealth: Payer: Self-pay

## 2023-09-03 NOTE — Telephone Encounter (Signed)
 Done

## 2023-09-03 NOTE — Telephone Encounter (Signed)
 Copied from CRM #[id number]. Topic: General - Other >> Sep 03, 2023  9:25 AM Treva T wrote: Reason for CRM: Received call from patient, states she missed a call from office.  Per chart review, no notes/documentation seen of any attempts to reach patient.  Patient requesting a return call from missed call, can be reached at [phone], per patient states she would also like to know results from Bone density scan.

## 2023-09-15 ENCOUNTER — Ambulatory Visit (INDEPENDENT_AMBULATORY_CARE_PROVIDER_SITE_OTHER)

## 2023-09-15 DIAGNOSIS — Z23 Encounter for immunization: Secondary | ICD-10-CM

## 2023-10-07 DIAGNOSIS — R748 Abnormal levels of other serum enzymes: Secondary | ICD-10-CM | POA: Diagnosis not present

## 2023-10-08 ENCOUNTER — Ambulatory Visit (INDEPENDENT_AMBULATORY_CARE_PROVIDER_SITE_OTHER): Payer: Self-pay | Admitting: Gastroenterology

## 2023-10-08 LAB — COMPREHENSIVE METABOLIC PANEL WITH GFR
ALT: 9 IU/L (ref 0–32)
AST: 19 IU/L (ref 0–40)
Albumin: 4 g/dL (ref 3.8–4.8)
Alkaline Phosphatase: 182 IU/L — ABNORMAL HIGH (ref 49–135)
BUN/Creatinine Ratio: 10 — ABNORMAL LOW (ref 12–28)
BUN: 10 mg/dL (ref 8–27)
Bilirubin Total: 0.5 mg/dL (ref 0.0–1.2)
CO2: 22 mmol/L (ref 20–29)
Calcium: 9.3 mg/dL (ref 8.7–10.3)
Chloride: 105 mmol/L (ref 96–106)
Creatinine, Ser: 1.02 mg/dL — ABNORMAL HIGH (ref 0.57–1.00)
Globulin, Total: 2.7 g/dL (ref 1.5–4.5)
Glucose: 84 mg/dL (ref 70–99)
Potassium: 3.8 mmol/L (ref 3.5–5.2)
Sodium: 143 mmol/L (ref 134–144)
Total Protein: 6.7 g/dL (ref 6.0–8.5)
eGFR: 58 mL/min/1.73 — ABNORMAL LOW (ref >59)

## 2023-10-13 ENCOUNTER — Other Ambulatory Visit (INDEPENDENT_AMBULATORY_CARE_PROVIDER_SITE_OTHER): Payer: Self-pay

## 2023-10-13 DIAGNOSIS — R748 Abnormal levels of other serum enzymes: Secondary | ICD-10-CM

## 2023-10-28 ENCOUNTER — Encounter (INDEPENDENT_AMBULATORY_CARE_PROVIDER_SITE_OTHER): Payer: Self-pay | Admitting: Gastroenterology

## 2023-11-10 ENCOUNTER — Encounter (INDEPENDENT_AMBULATORY_CARE_PROVIDER_SITE_OTHER): Payer: Self-pay | Admitting: Gastroenterology

## 2023-11-10 ENCOUNTER — Ambulatory Visit (INDEPENDENT_AMBULATORY_CARE_PROVIDER_SITE_OTHER): Payer: Medicare PPO | Admitting: Gastroenterology

## 2023-11-10 VITALS — BP 109/71 | HR 83 | Temp 97.3°F | Ht 65.0 in | Wt 157.2 lb

## 2023-11-10 DIAGNOSIS — R748 Abnormal levels of other serum enzymes: Secondary | ICD-10-CM | POA: Diagnosis not present

## 2023-11-10 DIAGNOSIS — R09A2 Foreign body sensation, throat: Secondary | ICD-10-CM

## 2023-11-10 NOTE — Patient Instructions (Signed)
 Please continue elavil  10mg  nightly Dr. Eartha will be in touch after the results of the liver biopsy come back  Follow up to be determined after biopsy

## 2023-11-10 NOTE — Progress Notes (Addendum)
 Referring Provider: Antonetta Rollene BRAVO, MD Primary Care Physician:  Antonetta Rollene BRAVO, MD Primary GI Physician: Dr. Eartha   Chief Complaint  Patient presents with   Follow-up    Patient here today for a follow up. Patient denies any current gi related issues, but did want to discuss her having an up coming liver biopsy this coming Friday at [facility].    HPI:   Gina Nelson is a 72 y.o. female with past medical history of arthritis, right breast cancer, globus sensation, elevated Alkaline phosphatase   Patient presenting today for:  Follow up of elevated Alk phos and Globus sensation  Last seen in march by Dr. Eartha, at that time found to have elevated Alk phos recently at PCP. Pt had no complaints.   Recommended check CMP, alk phos isoenzymes, IgM, AMA, continue elavil  10mg  at bedtime  Labs done in march with negative AMA, isoenzymes WNL IGM 77 AP 198  Recommended repeat CMp in 6 months, Alk phos in September was 182 Patient recommended to undergo liver biopsy  Present:  Patient states that she has concerns about the liver biopsy she has on Friday. She states she had requested that she be sedated as she had already discussed this with Dr. Eartha previously.   She is doing well otherwise. globus sensation is well controlled, has no issues since she started taking elavil  nightly. She denies any Side effects. No GERD symptoms.   No red flag symptoms. Patient denies melena, hematochezia, nausea, vomiting, diarrhea, constipation, dysphagia, odyonophagia, early satiety or weight loss.    Last Colonoscopy:04/25/2021 Diminutive polyp in the proximal transverse colon (hyperplastic polyp) diverticulosis, external hemorrhoids. Advised to not repeat colonoscopy given age Last Endoscopy:06/2022 - No endoscopic esophageal abnormality to explain                            patient's dysphagia. Esophagus dilated. Dilated.                           - Z-line irregular, 37 cm from the  incisors.                            Biopsied.                           - 2 cm hiatal hernia.                           - Normal stomach.                           - Normal examined duodenum.   Pathology showed normal GE junction, no need to repeat EGD  Filed Weights   11/10/23 1044  Weight: 157 lb 3.2 oz (71.3 kg)     Past Medical History:  Diagnosis Date   Allergy    Arthritis    knee   Blood transfusion    Cancer (HCC)    right breast T1cN2   External hemorrhoid 11/27/2022   GERD (gastroesophageal reflux disease)    PONV (postoperative nausea and vomiting)     Past Surgical History:  Procedure Laterality Date   ABDOMINAL HYSTERECTOMY  1984   BIOPSY  12/12/2016   Procedure: BIOPSY;  Surgeon: Golda Claudis PENNER, MD;  [facility];  Service: Endoscopy;;  gastric   BIOPSY  04/25/2021   Procedure: BIOPSY;  Surgeon: Golda Claudis PENNER, MD;  [facility];  Service: Endoscopy;;   BIOPSY  06/24/2022   Procedure: BIOPSY;  Surgeon: Eartha Flavors, Toribio, MD;  [facility];  Service: Gastroenterology;;   BREAST SURGERY  right MRM, left simple mastectomy   2012   BREAST SURGERY  left mastectomy   2012   COLONOSCOPY  05/07/2011   Procedure: COLONOSCOPY;  Surgeon: Claudis PENNER Golda, MD;  [facility];  Service: Endoscopy;  Laterality: N/A;  830   COLONOSCOPY WITH PROPOFOL  N/A 04/25/2021   Procedure: COLONOSCOPY WITH PROPOFOL ;  Surgeon: Golda Claudis PENNER, MD;  [facility];  Service: Endoscopy;  Laterality: N/A;  10:00   ESOPHAGEAL DILATION N/A 06/24/2022   Procedure: ESOPHAGEAL DILATION;  Surgeon: Eartha Flavors Toribio, MD;  [facility];  Service: Gastroenterology;  Laterality: N/A;  2:30;asa 3   ESOPHAGOGASTRODUODENOSCOPY N/A 12/12/2016   Procedure: ESOPHAGOGASTRODUODENOSCOPY (EGD);  Surgeon: Golda Claudis PENNER, MD;  [facility];  Service: Endoscopy;  Laterality: N/A;  9:15   ESOPHAGOGASTRODUODENOSCOPY (EGD)  WITH PROPOFOL  N/A 06/24/2022   Procedure: ESOPHAGOGASTRODUODENOSCOPY (EGD) WITH PROPOFOL ;  Surgeon: Eartha Flavors Toribio, MD;  [facility];  Service: Gastroenterology;  Laterality: N/A;  to arrive at 0715.   HEMORRHOID SURGERY N/A 11/27/2022   Procedure: HEMORRHOIDECTOMY, SIMPLE;  Surgeon: Evonnie Dorothyann LABOR, DO;  Location: [facility];  Service: General;  Laterality: N/A;   JOINT REPLACEMENT  11/2009   right knee   POLYPECTOMY  04/25/2021   Procedure: POLYPECTOMY;  Surgeon: Golda Claudis PENNER, MD;  [facility];  Service: Endoscopy;;   PORT-A-CATH REMOVAL  07/16/10   TOTAL KNEE ARTHROPLASTY  2011    Current Outpatient Medications  Medication Sig Dispense Refill   amitriptyline  (ELAVIL ) 10 MG tablet Take 1 tablet (10 mg total) by mouth at bedtime.     betamethasone  dipropionate 0.05 % cream APPLY A FINGER TIP AMOUNT TWICE WEEKLY AS NEEDED FOR DISCOMFORT 45 g 1   bisacodyl  (DULCOLAX) 5 MG EC tablet Take 5 mg by mouth every other day.     cholecalciferol (VITAMIN D3) 25 MCG (1000 UNIT) tablet Take 1,000 Units by mouth 4 (four) times a week.     clotrimazole -betamethasone  (LOTRISONE ) cream Apply 1 Application topically 2 (two) times daily as needed (irritation behind ears due to eye glasses). 45 g 3   EQ ALL DAY ALLERGY RELIEF 10 MG tablet TAKE 1 TABLET BY MOUTH ONCE DAILY AS NEEDED FOR ALLERGIES 30 tablet 0   meclizine  (ANTIVERT ) 12.5 MG tablet TAKE 1 TABLET BY MOUTH THREE TIMES DAILY AS NEEDED FOR DIZZINESS 21 tablet 0   senna-docusate (SENOKOT-S) 8.6-50 MG tablet Take 1 tablet by mouth 2 (two) times daily. (Patient taking differently: Take 1 tablet by mouth. Every two to three days.) 60 tablet 3   No current facility-administered medications for this visit.    Allergies as of 11/10/2023   (No Known Allergies)    Social History   Socioeconomic History   Marital status: Married    Spouse name: Marsha   Number of children: 1   Years of education: Not on file    Highest education level: 12th grade  Occupational History   Occupation: retired  Tobacco Use   Smoking status: Never   Smokeless tobacco: Never  Vaping Use   Vaping status: Never Used  Substance and Sexual Activity  Alcohol use: No   Drug use: No   Sexual activity: Yes    Birth control/protection: Surgical  Other Topics Concern   Not on file  Social History Narrative   Has been married 48 years. Husband is a optician, dispensing. She is retired.Visits sick people in the hospital. Enjoys word search puzzles.   Social Drivers of Corporate Investment Banker Strain: Low Risk  (08/24/2023)   Overall Financial Resource Strain (CARDIA)    Difficulty of Paying Living Expenses: Not hard at all  Food Insecurity: No Food Insecurity (08/24/2023)   Hunger Vital Sign    Worried About Running Out of Food in the Last Year: Never true    Ran Out of Food in the Last Year: Never true  Transportation Needs: No Transportation Needs (08/24/2023)   PRAPARE - Administrator, Civil Service (Medical): No    Lack of Transportation (Non-Medical): No  Physical Activity: Sufficiently Active (08/24/2023)   Exercise Vital Sign    Days of Exercise per Week: 7 days    Minutes of Exercise per Session: 30 min  Stress: No Stress Concern Present (08/24/2023)   [employer] of Occupational Health - Occupational Stress Questionnaire    Feeling of Stress: Not at all  Social Connections: Socially Integrated (08/24/2023)   Social Connection and Isolation Panel    Frequency of Communication with Friends and Family: More than three times a week    Frequency of Social Gatherings with Friends and Family: Once a week    Attends Religious Services: More than 4 times per year    Active Member of [employer] or Organizations: Yes    Attends Engineer, Structural: More than 4 times per year    Marital Status: Married    Review of systems General: negative for malaise, night sweats, fever, chills, weight loss Neck:  Negative for lumps, goiter, pain and significant neck swelling Resp: Negative for cough, wheezing, dyspnea at rest CV: Negative for chest pain, leg swelling, palpitations, orthopnea GI: denies melena, hematochezia, nausea, vomiting, diarrhea, constipation, dysphagia, odyonophagia, early satiety or unintentional weight loss.  MSK: Negative for joint pain or swelling, back pain, and muscle pain. Derm: Negative for itching or rash Psych: Denies depression, anxiety, memory loss, confusion. No homicidal or suicidal ideation.  Heme: Negative for prolonged bleeding, bruising easily, and swollen nodes. Endocrine: Negative for cold or heat intolerance, polyuria, polydipsia and goiter. Neuro: negative for tremor, gait imbalance, syncope and seizures. The remainder of the review of systems is noncontributory.  Physical Exam: BP 109/71 (BP Location: Left Arm, Patient Position: Sitting, Cuff Size: Normal)   Pulse 83   Temp (!) 97.3 F (36.3 C) (Temporal)   Ht 5' 5 (1.651 m)   Wt 157 lb 3.2 oz (71.3 kg)   BMI 26.16 kg/m  General:   Alert and oriented. No distress noted. Pleasant and cooperative.  Head:  Normocephalic and atraumatic. Eyes:  Conjuctiva clear without scleral icterus. Mouth:  Oral mucosa pink and moist. Good dentition. No lesions. Heart: Normal rate and rhythm, s1 and s2 heart sounds present.  Lungs: Clear lung sounds in all lobes. Respirations equal and unlabored. Abdomen:  +BS, soft, non-tender and non-distended. No rebound or guarding. No HSM or masses noted. Derm: No palmar erythema or jaundice Msk:  Symmetrical without gross deformities. Normal posture. Extremities:  Without edema. Neurologic:  Alert and  oriented x4 Psych:  Alert and cooperative. Normal mood and affect.  Invalid input(s): 6 MONTHS   ASSESSMENT: Gina Nelson is a 73 y.o. female presenting today for follow up of elevated Alk phos and Globus sensation  Elevated Alk phos: ongoing elevation of AP since  early 2025 with normal isoenzymes, negative AMA and IgM, unclear etiology at this time. Patient is scheduled for liver biopsy on Friday. Further recommendations to follow once biopsy results are back. I did discuss the procedure with the patient who again expressed concern regarding being asleep for the procedure, we discussed twilight sedation as she had with her TCS and as was previously discussed with Dr. Eartha. She inquired about what to expect afterwards, I explained there may be some soreness and possible bruising at the site, she will be given instructions afterwards on any activity limitations/restrictions following the procedure.   Globus sensation: resolved with use of elavil  10mg  at bedtime. She has tolerated the medication well without side effects. For now will continue low dose TCA    PLAN:  -keep appt for liver biopsy on 10/31 -further recommendations to follow liver biopsy results -continue elavil  10mg  at bedtime  All questions were answered, patient verbalized understanding and is in agreement with plan as outlined above.   Follow Up: TBD after liver biopsy results   Gina Soltau L. Mariette, MSN, APRN, AGNP-C Adult-Gerontology Nurse Practitioner [facility]  I have reviewed the note and agree with the APP's assessment as described in this progress note  Toribio Eartha, MD Gastroenterology and Hepatology [facility] Gastroenterology

## 2023-11-11 ENCOUNTER — Other Ambulatory Visit: Payer: Self-pay | Admitting: Radiology

## 2023-11-11 DIAGNOSIS — R7989 Other specified abnormal findings of blood chemistry: Secondary | ICD-10-CM

## 2023-11-11 NOTE — H&P (Signed)
 Chief Complaint: Elevated alkaline phosphatase; referred for image guided random core liver biopsy for further evaluation  Referring Provider(s): Eartha Mayorga,D  Supervising Physician: Philip Cornet  Patient Status: [facility] - Out-pt  History of Present Illness: Gina Nelson is a 73 y.o. female with past medical history significant for arthritis, right breast cancer, GERD, fatty liver by imaging who presents now with persistently elevated alkaline phosphatase of uncertain etiology.  She is scheduled today for image guided random core liver biopsy for further evaluation.  *** Patient is Full Code  Past Medical History:  Diagnosis Date   Allergy    Arthritis    knee   Blood transfusion    Cancer (HCC)    right breast T1cN2   External hemorrhoid 11/27/2022   GERD (gastroesophageal reflux disease)    PONV (postoperative nausea and vomiting)     Past Surgical History:  Procedure Laterality Date   ABDOMINAL HYSTERECTOMY  1984   BIOPSY  12/12/2016   Procedure: BIOPSY;  Surgeon: Golda Claudis PENNER, MD;  [facility];  Service: Endoscopy;;  gastric   BIOPSY  04/25/2021   Procedure: BIOPSY;  Surgeon: Golda Claudis PENNER, MD;  [facility];  Service: Endoscopy;;   BIOPSY  06/24/2022   Procedure: BIOPSY;  Surgeon: Eartha Flavors, Toribio, MD;  [facility];  Service: Gastroenterology;;   BREAST SURGERY  right MRM, left simple mastectomy   2012   BREAST SURGERY  left mastectomy   2012   COLONOSCOPY  05/07/2011   Procedure: COLONOSCOPY;  Surgeon: Claudis PENNER Golda, MD;  [facility];  Service: Endoscopy;  Laterality: N/A;  830   COLONOSCOPY WITH PROPOFOL  N/A 04/25/2021   Procedure: COLONOSCOPY WITH PROPOFOL ;  Surgeon: Golda Claudis PENNER, MD;  [facility];  Service: Endoscopy;  Laterality: N/A;  10:00   ESOPHAGEAL DILATION N/A 06/24/2022   Procedure: ESOPHAGEAL DILATION;  Surgeon: Eartha Flavors Toribio, MD;  [facility];   Service: Gastroenterology;  Laterality: N/A;  2:30;asa 3   ESOPHAGOGASTRODUODENOSCOPY N/A 12/12/2016   Procedure: ESOPHAGOGASTRODUODENOSCOPY (EGD);  Surgeon: Golda Claudis PENNER, MD;  [facility];  Service: Endoscopy;  Laterality: N/A;  9:15   ESOPHAGOGASTRODUODENOSCOPY (EGD) WITH PROPOFOL  N/A 06/24/2022   Procedure: ESOPHAGOGASTRODUODENOSCOPY (EGD) WITH PROPOFOL ;  Surgeon: Eartha Flavors Toribio, MD;  [facility];  Service: Gastroenterology;  Laterality: N/A;  to arrive at 0715.   HEMORRHOID SURGERY N/A 11/27/2022   Procedure: HEMORRHOIDECTOMY, SIMPLE;  Surgeon: Evonnie Dorothyann LABOR, DO;  Location: [facility];  Service: General;  Laterality: N/A;   JOINT REPLACEMENT  11/2009   right knee   POLYPECTOMY  04/25/2021   Procedure: POLYPECTOMY;  Surgeon: Golda Claudis PENNER, MD;  [facility];  Service: Endoscopy;;   PORT-A-CATH REMOVAL  07/16/10   TOTAL KNEE ARTHROPLASTY  2011    Allergies: Patient has no known allergies.  Medications: Prior to Admission medications   Medication Sig Start Date End Date Taking? Authorizing Provider  amitriptyline  (ELAVIL ) 10 MG tablet Take 1 tablet (10 mg total) by mouth at bedtime. 07/31/23   Antonetta Rollene BRAVO, MD  betamethasone  dipropionate 0.05 % cream APPLY A FINGER TIP AMOUNT TWICE WEEKLY AS NEEDED FOR DISCOMFORT 08/03/23   Antonetta Rollene BRAVO, MD  bisacodyl  (DULCOLAX) 5 MG EC tablet Take 5 mg by mouth every other day.    [provider]  cholecalciferol (VITAMIN D3) 25 MCG (1000 UNIT) tablet Take 1,000 Units by mouth 4 (four) times a  week.    [provider]  clotrimazole -betamethasone  (LOTRISONE ) cream Apply 1 Application topically 2 (two) times daily as needed (irritation behind ears due to eye glasses). 07/31/23   Antonetta Rollene BRAVO, MD  EQ ALL DAY ALLERGY RELIEF 10 MG tablet TAKE 1 TABLET BY MOUTH ONCE DAILY AS NEEDED FOR ALLERGIES 06/09/23   Antonetta Rollene BRAVO, MD  meclizine  (ANTIVERT ) 12.5 MG tablet TAKE  1 TABLET BY MOUTH THREE TIMES DAILY AS NEEDED FOR DIZZINESS 05/22/22   Antonetta Rollene BRAVO, MD  senna-docusate (SENOKOT-S) 8.6-50 MG tablet Take 1 tablet by mouth 2 (two) times daily. Patient taking differently: Take 1 tablet by mouth. Every two to three days. 10/30/22   Antonetta Rollene BRAVO, MD     Family History  Problem Relation Age of Onset   Prostate cancer Father    Pancreatic cancer Sister    Pneumonia Mother    Hypertension Daughter     Social History   Socioeconomic History   Marital status: Married    Spouse name: Marsha   Number of children: 1   Years of education: Not on file   Highest education level: 12th grade  Occupational History   Occupation: retired  Tobacco Use   Smoking status: Never   Smokeless tobacco: Never  Vaping Use   Vaping status: Never Used  Substance and Sexual Activity   Alcohol use: No   Drug use: No   Sexual activity: Yes    Birth control/protection: Surgical  Other Topics Concern   Not on file  Social History Narrative   Has been married 48 years. Husband is a optician, dispensing. She is retired.Visits sick people in the hospital. Enjoys word search puzzles.   Social Drivers of Corporate Investment Banker Strain: Low Risk  (08/24/2023)   Overall Financial Resource Strain (CARDIA)    Difficulty of Paying Living Expenses: Not hard at all  Food Insecurity: No Food Insecurity (08/24/2023)   Hunger Vital Sign    Worried About Running Out of Food in the Last Year: Never true    Ran Out of Food in the Last Year: Never true  Transportation Needs: No Transportation Needs (08/24/2023)   PRAPARE - Administrator, Civil Service (Medical): No    Lack of Transportation (Non-Medical): No  Physical Activity: Sufficiently Active (08/24/2023)   Exercise Vital Sign    Days of Exercise per Week: 7 days    Minutes of Exercise per Session: 30 min  Stress: No Stress Concern Present (08/24/2023)   [employer] of Occupational Health - Occupational  Stress Questionnaire    Feeling of Stress: Not at all  Social Connections: Socially Integrated (08/24/2023)   Social Connection and Isolation Panel    Frequency of Communication with Friends and Family: More than three times a week    Frequency of Social Gatherings with Friends and Family: Once a week    Attends Religious Services: More than 4 times per year    Active Member of [employer] or Organizations: Yes    Attends Engineer, Structural: More than 4 times per year    Marital Status: Married       Review of Systems  Vital Signs:   Advance Care Plan: no documents on file   .  Physical Exam  Imaging: No results found.  Labs:  CBC: Recent Labs    02/19/23 0826  WBC 7.6  HGB 12.0  HCT 39.2  PLT 189    COAGS: No results for input(s): INR, APTT  in the last 8760 hours.  BMP: Recent Labs    02/19/23 0826 03/26/23 1353 10/07/23 1035  NA 143 145* 143  K 3.9 4.2 3.8  CL 105 106 105  CO2 23 24 22   GLUCOSE 80 82 84  BUN 7* 8 10  CALCIUM 9.4 9.6 9.3  CREATININE 0.88 0.94 1.02*    LIVER FUNCTION TESTS: Recent Labs    02/19/23 0826 03/26/23 1353 07/09/23 1406 10/07/23 1035  BILITOT 0.4 0.4 0.4 0.5  AST 29 23 20 19   ALT 16 11 7 9   ALKPHOS 190* 198* 184* 182*  PROT 6.6 7.0 6.8 6.7  ALBUMIN 4.2 4.6 4.0 4.0    TUMOR MARKERS: No results for input(s): AFPTM, CEA, CA199, CHROMGRNA in the last 8760 hours.  Assessment and Plan: 73 y.o. female with past medical history significant for arthritis, right breast cancer, GERD, fatty liver by imaging who presents now with persistently elevated alkaline phosphatase of uncertain etiology.  She is scheduled today for image guided random core liver biopsy for further evaluation.Risks and benefits of procedure was discussed with the patient including, but not limited to bleeding, infection, damage to adjacent structures or low yield requiring additional tests.  All of the questions were answered and  there is agreement to proceed.  Consent signed and in chart.    Thank you for allowing our service to participate in Gina Nelson 's care.  Electronically Signed: D. Franky Rakers, PA-C   11/11/2023, 4:34 PM      I spent a total of   25 minutes  in face to face in clinical consultation, greater than 50% of which was counseling/coordinating care for image guided random core liver biopsy

## 2023-11-13 ENCOUNTER — Encounter (HOSPITAL_COMMUNITY): Payer: Self-pay

## 2023-11-13 ENCOUNTER — Other Ambulatory Visit: Payer: Self-pay

## 2023-11-13 ENCOUNTER — Ambulatory Visit (HOSPITAL_COMMUNITY)
Admission: RE | Admit: 2023-11-13 | Discharge: 2023-11-13 | Disposition: A | Discharge status: Home / Self Care | Source: Ambulatory Visit | Attending: Gastroenterology | Admitting: Gastroenterology

## 2023-11-13 DIAGNOSIS — M1990 Unspecified osteoarthritis, unspecified site: Secondary | ICD-10-CM | POA: Diagnosis not present

## 2023-11-13 DIAGNOSIS — K760 Fatty (change of) liver, not elsewhere classified: Secondary | ICD-10-CM | POA: Diagnosis not present

## 2023-11-13 DIAGNOSIS — Z01818 Encounter for other preprocedural examination: Secondary | ICD-10-CM | POA: Diagnosis not present

## 2023-11-13 DIAGNOSIS — K219 Gastro-esophageal reflux disease without esophagitis: Secondary | ICD-10-CM | POA: Diagnosis not present

## 2023-11-13 DIAGNOSIS — R7989 Other specified abnormal findings of blood chemistry: Secondary | ICD-10-CM | POA: Diagnosis not present

## 2023-11-13 DIAGNOSIS — R748 Abnormal levels of other serum enzymes: Secondary | ICD-10-CM | POA: Diagnosis not present

## 2023-11-13 LAB — CBC WITH DIFFERENTIAL/PLATELET
Abs Immature Granulocytes: 0.02 K/uL (ref 0.00–0.07)
Basophils Absolute: 0 K/uL (ref 0.0–0.1)
Basophils Relative: 0 %
Eosinophils Absolute: 0.2 K/uL (ref 0.0–0.5)
Eosinophils Relative: 3 %
HCT: 38.7 % (ref 36.0–46.0)
Hemoglobin: 11.9 g/dL — ABNORMAL LOW (ref 12.0–15.0)
Immature Granulocytes: 0 %
Lymphocytes Relative: 24 %
Lymphs Abs: 1.9 K/uL (ref 0.7–4.0)
MCH: 27 pg (ref 26.0–34.0)
MCHC: 30.7 g/dL (ref 30.0–36.0)
MCV: 88 fL (ref 80.0–100.0)
Monocytes Absolute: 0.8 K/uL (ref 0.1–1.0)
Monocytes Relative: 10 %
Neutro Abs: 5 K/uL (ref 1.7–7.7)
Neutrophils Relative %: 63 %
Platelets: 158 K/uL (ref 150–400)
RBC: 4.4 MIL/uL (ref 3.87–5.11)
RDW: 15.8 % — ABNORMAL HIGH (ref 11.5–15.5)
WBC: 8 K/uL (ref 4.0–10.5)
nRBC: 0 % (ref 0.0–0.2)

## 2023-11-13 LAB — COMPREHENSIVE METABOLIC PANEL WITH GFR
ALT: 11 U/L (ref 0–44)
AST: 25 U/L (ref 15–41)
Albumin: 4.2 g/dL (ref 3.5–5.0)
Alkaline Phosphatase: 185 U/L — ABNORMAL HIGH (ref 38–126)
Anion gap: 11 (ref 5–15)
BUN: 12 mg/dL (ref 8–23)
CO2: 26 mmol/L (ref 22–32)
Calcium: 9.7 mg/dL (ref 8.9–10.3)
Chloride: 105 mmol/L (ref 98–111)
Creatinine, Ser: 0.92 mg/dL (ref 0.44–1.00)
GFR, Estimated: >60 mL/min (ref >60)
Glucose, Bld: 76 mg/dL (ref 70–99)
Potassium: 3.8 mmol/L (ref 3.5–5.1)
Sodium: 141 mmol/L (ref 135–145)
Total Bilirubin: 0.7 mg/dL (ref 0.0–1.2)
Total Protein: 7.4 g/dL (ref 6.5–8.1)

## 2023-11-13 LAB — PROTIME-INR
INR: 1 (ref 0.8–1.2)
Prothrombin Time: 13.8 s (ref 11.4–15.2)

## 2023-11-13 MED ORDER — FENTANYL CITRATE (PF) 100 MCG/2ML IJ SOLN
INTRAMUSCULAR | Status: AC
  Filled 2023-11-13: qty 2

## 2023-11-13 MED ORDER — ACETAMINOPHEN 500 MG PO TABS
ORAL_TABLET | ORAL | Status: AC
  Filled 2023-11-13: qty 2

## 2023-11-13 MED ORDER — MIDAZOLAM HCL 2 MG/2ML IJ SOLN
INTRAMUSCULAR | Status: AC
  Filled 2023-11-13: qty 2

## 2023-11-13 MED ORDER — SODIUM CHLORIDE 0.9 % IV SOLN: INTRAVENOUS | Status: DC

## 2023-11-13 MED ORDER — GELATIN ABSORBABLE 12-7 MM EX MISC
CUTANEOUS | Status: AC
  Filled 2023-11-13: qty 1

## 2023-11-13 MED ORDER — FENTANYL CITRATE (PF) 100 MCG/2ML IJ SOLN
INTRAMUSCULAR | Status: AC | PRN
  Administered 2023-11-13 (×2): 50 ug via INTRAVENOUS

## 2023-11-13 MED ORDER — LIDOCAINE HCL 1 % IJ SOLN
INTRAMUSCULAR | Status: AC
  Filled 2023-11-13: qty 20

## 2023-11-13 MED ORDER — MIDAZOLAM HCL (PF) 2 MG/2ML IJ SOLN
INTRAMUSCULAR | Status: AC | PRN
Start: 2023-11-13 — End: 2023-11-13
  Administered 2023-11-13 (×2): 1 mg via INTRAVENOUS
  Administered 2023-11-13: .5 mg via INTRAVENOUS

## 2023-11-13 MED ORDER — ACETAMINOPHEN 500 MG PO TABS
1000.0000 mg | ORAL_TABLET | Freq: Four times a day (QID) | ORAL | Status: AC | PRN
  Administered 2023-11-13: 1000 mg via ORAL

## 2023-11-13 MED ORDER — MIDAZOLAM HCL 2 MG/2ML IJ SOLN
INTRAMUSCULAR | Status: AC
Start: 2023-11-13 — End: 2023-11-13
  Filled 2023-11-13: qty 2

## 2023-11-13 NOTE — Discharge Instructions (Signed)
 For questions /concerns may call Interventional Radiology at [phone] or  Interventional Radiology clinic [phone]   You may remove your dressing and shower tomorrow afternoon  DO NOT use EMLA cream for 2 weeks after port placement as the cream will remove surgical glue on your incision.                                                    Moderate Conscious Sedation, Adult, Care After After the procedure, it is common to have: Sleepiness for a few hours. Impaired judgment for a few hours. Trouble with balance. Nausea or vomiting if you eat too soon. Follow these instructions at home: For the time period you were told by your health care provider:  Rest. Do not participate in activities where you could fall or become injured. Do not drive or use machinery. Do not drink alcohol. Do not take sleeping pills or medicines that cause drowsiness. Do not make important decisions or sign legal documents. Do not take care of children on your own. Eating and drinking Follow instructions from your health care provider about what you may eat and drink. Drink enough fluid to keep your urine pale yellow. If you vomit: Drink clear fluids slowly and in small amounts as you are able. Clear fluids include water , ice chips, low-calorie sports drinks, and fruit juice that has water  added to it (diluted fruit juice). Eat light and bland foods in small amounts as you are able. These foods include bananas, applesauce, rice, lean meats, toast, and crackers. General instructions Take over-the-counter and prescription medicines only as told by your health care provider. Have a responsible adult stay with you for the time you are told. Do not use any products that contain nicotine or tobacco. These products include cigarettes, chewing tobacco, and vaping devices, such as e-cigarettes. If you need help quitting, ask your health care provider. Return to your normal activities as told by your health care  provider. Ask your health care provider what activities are safe for you. Your health care provider may give you more instructions. Make sure you know what you can and cannot do. Contact a health care provider if: You are still sleepy or having trouble with balance after 24 hours. You feel light-headed. You vomit every time you eat or drink. You get a rash. You have a fever. You have redness or swelling around the IV site. Get help right away if: You have trouble breathing. You start to feel confused at home. These symptoms may be an emergency. Get help right away. Call 911. Do not wait to see if the symptoms will go away. Do not drive yourself to the hospital. This information is not intended to replace advice given to you by your health care provider. Make sure you discuss any questions you have with your health care provider.                                                  Liver Biopsy, Care After After a liver biopsy, it is common to have these things in the area where the biopsy was done. You may: Have pain. Feel sore. Have bruising. You may also feel tired for a  few days. Follow these instructions at home: Medicines Take over-the-counter and prescription medicines only as told by your doctor. If you were prescribed an antibiotic medicine, take it as told by your doctor. Do not stop taking the antibiotic, even if you start to feel better. Do not take medicines that may thin your blood. These medicines include aspirin and ibuprofen . Take them only if your doctor tells you to. If told, take steps to prevent problems with pooping (constipation). You may need to: Drink enough fluid to keep your pee (urine) pale yellow. Take medicines. You will be told what medicines to take. Eat foods that are high in fiber. These include beans, whole grains, and fresh fruits and vegetables. Limit foods that are high in fat and sugar. These include fried or sweet foods. Ask your doctor if you  should avoid driving or using machines while you are taking your medicine. Caring for your incision Follow instructions from your doctor about how to take care of your cut from surgery (incisions). Make sure you: Wash your hands with soap and water  for at least 20 seconds before and after you change your bandage. If you cannot use soap and water , use hand sanitizer. Change your bandage. Leavestitches or skin glue in place for at least two weeks. Leave tape strips alone unless you are told to take them off. You may trim the edges of the tape strips if they curl up. Check your incision every day for signs of infection. Check for: Redness, swelling, or more pain. Fluid or blood. Warmth. Pus or a bad smell. Do not take baths, swim, or use a hot tub. Ask your doctor about taking showers or sponge baths. Activity Rest at home for 1-2 days, or as told by your doctor. Get up to take short walks every 1 to 2 hours. Ask for help if you feel weak or unsteady. Do not lift anything that is heavier than 10 lb (4.5 kg), or the limit that you are told. Do not play contact sports for 2 weeks after the procedure. Return to your normal activities as told by your doctor. Ask what activities are safe for you. General instructions  Do not drink alcohol in the first week after the procedure. Plan to have a responsible adult care for you for the time you are told after you leave the hospital or clinic. This is important. It is up to you to get the results of your procedure. Ask how to get your results when they are ready. Keep all follow-up visits. Contact a doctor if: You have more bleeding in your incision. Your incision swells, or is red and more painful. You have fluid that comes from your incision. You develop a rash. You have fever or chills. Get help right away if: You have swelling, bloating, or pain in your belly (abdomen). You get dizzy or faint. You vomit or you feel like vomiting. You have  trouble breathing or feel short of breath. You have chest pain. You have problems talking or seeing. You have trouble with your balance or moving your arms or legs. These symptoms may be an emergency. Get help right away. Call your local emergency services (911 in the [place] Do not wait to see if the symptoms will go away. Do not drive yourself to the hospital. Summary After the procedure, it is common to have pain, soreness, bruising, and tiredness. Your doctor will tell you how to take care of yourself at home. Change your bandage, take your medicines,  and limit your activities as told by your doctor. Call your doctor if you have symptoms of infection. Get help right away if your belly swells, your cut bleeds a lot, or you have trouble talking or breathing. This information is not intended to replace advice given to you by your health care provider. Make sure you discuss any questions you have with your health care provider. Document Revised: 11/14/2019 Document Reviewed: 11/14/2019 Elsevier Patient Education  2024 [employer].

## 2023-11-13 NOTE — Procedures (Signed)
 Interventional Radiology Procedure:   Indications: Elevated alkaline phosphatase level   Procedure: US  guided liver biopsy  Findings: 3 core biopsies from right hepatic lobe.  Gelfoam slurry injected along biopsy tract.    Complications: None     EBL: Minimal  Plan:  Bedrest 2 hours.    Greycen Felter R. Philip, MD  Pager: [phone]

## 2023-11-13 NOTE — Progress Notes (Signed)
 Patient BP dropped to 96/82- pt. Had been stable with BP 140/-150/ systolic.  No pain.   IVF increased.   Notified K Allred PA Came by to see pt.   Dr. Philip and IR with ultrasound in to see pt. And US  shows no bleeding or fluid at biopsy site.  Requested pain med for slight pain in right shoulder- Tylenol  given per order.  Pt. Stablized and was able to be discharged at 1545.

## 2023-11-17 LAB — SURGICAL PATHOLOGY

## 2023-11-23 ENCOUNTER — Ambulatory Visit (INDEPENDENT_AMBULATORY_CARE_PROVIDER_SITE_OTHER): Payer: Self-pay | Admitting: Gastroenterology

## 2023-11-23 ENCOUNTER — Telehealth (INDEPENDENT_AMBULATORY_CARE_PROVIDER_SITE_OTHER): Payer: Self-pay | Admitting: Gastroenterology

## 2023-11-23 NOTE — Telephone Encounter (Signed)
 Pt left voicemail asking if we had results from biopsy that was done on 11/12/23. Pt had liver biopsy done on 02/13/23 at [facility]. Please advise. Thank you.

## 2023-11-30 ENCOUNTER — Ambulatory Visit (INDEPENDENT_AMBULATORY_CARE_PROVIDER_SITE_OTHER): Payer: Self-pay | Admitting: Gastroenterology

## 2023-12-16 ENCOUNTER — Other Ambulatory Visit: Payer: Self-pay

## 2023-12-16 ENCOUNTER — Telehealth: Payer: Self-pay | Admitting: Family Medicine

## 2023-12-16 MED ORDER — BETAMETHASONE DIPROPIONATE 0.05 % EX CREA: TOPICAL_CREAM | CUTANEOUS | 1 refills | Status: DC

## 2023-12-16 NOTE — Telephone Encounter (Signed)
 Cream refilled with note to stop automatic refills of lotrisone 

## 2023-12-16 NOTE — Telephone Encounter (Signed)
 Patient came by the office and needs this medicine on automatic refills betamethasone  dipropionate 0.05 % cream   And needs this medicine to stop auto refills she has 10 tubes at home  clotrimazole -betamethasone  (LOTRISONE ) cream

## 2024-01-18 ENCOUNTER — Ambulatory Visit: Payer: Self-pay

## 2024-01-18 NOTE — Telephone Encounter (Signed)
 FYI Only or Action Required?: Action required by provider: request for appointment.  Patient was last seen in primary care on 07/31/2023 by Antonetta Rollene BRAVO, MD.  Called Nurse Triage reporting Rash.  Symptoms began a week ago.  Interventions attempted: OTC medications: Pain Relief [employer].  Symptoms are: gradually worsening.  Triage Disposition: See Physician Within 24 Hours  Patient/caregiver understands and will follow disposition?: Yes   Copied from CRM [id number]. Topic: Clinical - Red Word Triage >> Jan 18, 2024  1:58 PM Olam RAMAN wrote: Red Word that prompted transfer to Nurse Triage: Back upper right pain may be shingles. tendor to touch Reason for Disposition  [1] Localized rash is very painful AND [2] no fever  Answer Assessment - Initial Assessment Questions 1. APPEARANCE of RASH: What does the rash look like? (e.g., blisters, dry flaky skin, red spots, redness, sores)      Unsure due to location  2. LOCATION: Where is the rash located?      Back  3. NUMBER: How many spots are there?       one  4. SIZE: How big are the spots? (e.g., inches, cm; or compare to size of pinhead, tip of pen, eraser, pea)       unsure  5. ONSET: When did the rash start?        1 week  6. ITCHING: Does the rash itch? If Yes, ask: How bad is the itch?  (Scale 0-10; or none, mild, moderate, severe)      Yes, itching  7. PAIN: Does the rash hurt? If Yes, ask: How bad is the pain?  (Scale 0-10; or none, mild, moderate, severe)      7/10   8. OTHER SYMPTOMS: Do you have any other symptoms? (e.g., fever)     Denies fever   Pt reports rash on back - pain Pt is taking OTC pain relief tablets [employer] brand Pt offered a visit in clinic tomorrow, 01.06.26, however, pt prefers to see her PCP. Routing to clinic to assist with scheduling pt for a visit Pt agrees with plan of care, will call back for any worsening symptoms  Protocols used: Rash or Redness -  Localized-A-AH

## 2024-01-19 ENCOUNTER — Encounter: Payer: Self-pay | Admitting: Family Medicine

## 2024-01-19 ENCOUNTER — Ambulatory Visit: Admitting: Family Medicine

## 2024-01-19 VITALS — BP 97/62 | HR 82 | Resp 14 | Ht 65.0 in | Wt 153.1 lb

## 2024-01-19 DIAGNOSIS — R09A2 Foreign body sensation, throat: Secondary | ICD-10-CM | POA: Diagnosis not present

## 2024-01-19 DIAGNOSIS — B369 Superficial mycosis, unspecified: Secondary | ICD-10-CM

## 2024-01-19 DIAGNOSIS — L853 Xerosis cutis: Secondary | ICD-10-CM | POA: Insufficient documentation

## 2024-01-19 MED ORDER — CLOTRIMAZOLE-BETAMETHASONE 1-0.05 % EX CREA: 1.0000 | TOPICAL_CREAM | Freq: Two times a day (BID) | CUTANEOUS | 1 refills | Status: AC

## 2024-01-19 NOTE — Assessment & Plan Note (Signed)
 Clotrimazole /betameth twice daily for 1 week then as needed

## 2024-01-19 NOTE — Assessment & Plan Note (Signed)
 Worse symptoms due to use of indoor heat, recommend dasily use of baby oil which she can tolerate

## 2024-01-19 NOTE — Assessment & Plan Note (Signed)
 Controlled with bedtime elavil  per GI

## 2024-01-19 NOTE — Patient Instructions (Signed)
 Keep appointment already scheduled next month  Cream is prescribed for rash on right chest , posterior  No infection or fluid in left ear   Pleaseprint labs ordered in July for pt to get for next visit , pls get 5 to 7 days before  It is important that you exercise regularly at least 30 minutes 5 times a week. If you develop chest pain, have severe difficulty breathing, or feel very tired, stop exercising immediately and seek medical attention    Thanks for choosing [facility], we consider it a privelige to serve you.

## 2024-01-19 NOTE — Progress Notes (Signed)
" ° °  ANNALEIA PENCE     MRN: [id number]      DOB: 11-09-1950  Chief Complaint  Patient presents with   Rash    Pt complains of itching and tenderness of right shoulder pain x2 days. Thinks it is shingles    Ear Fullness    Pt complains of ear fullness and wetness in the mornings for the last 6 months. Pt states it is not painful and she denies dizziness     HPI Ms. Neider is here 1 month intermittent itching in right posterior chest toward axilla no fevr , chills, blistering, does not use lotion but will start Coughing x  clear nasal drainage x 3 days, no fever or chills , using vick severe theraflu with relief C/O left ear fullness after  washing her hair in past 2 months at home , generally had hairdresser do it with no similar problem ROS Denies recent fever or chills. Denies chest congestion, productive cough or wheezing. Denies chest pains, palpitations and leg swelling Denies abdominal pain, nausea, vomiting,diarrhea or constipation.   Denies dysuria, frequency, hesitancy or incontinence. Denies joint pain, swelling and limitation in mobility. Denies headaches, seizures, numbness, or tingling. Denies depression, anxiety or insomnia.  PE BP 97/62   Pulse 82   Resp 14   Ht 5' 5 (1.651 m)   Wt 153 lb 1.3 oz (69.4 kg)   SpO2 96%   BMI 25.47 kg/m    Patient alert and oriented and in no cardiopulmonary distress.  HEENT: No facial asymmetry, EOMI,     Neck supple .TM clear bilterally , no erythema good light reflex, no effusion, external ear canals normal   Chest: Clear to auscultation bilaterally.  CVS: S1, S2 no murmurs, no S3.Regular rate.  ABD: Soft non tender.   Ext: No edema  MS: Adequate ROM spine, shoulders, hips and knees.  Skin: Intact, fungal  rash and dry skin noted on posterior left lower chest  Psych: Good eye contact, normal affect. Memory intact not anxious or depressed appearing.  CNS: CN 2-12 intact, power,  normal throughout.no focal deficits  noted.   Assessment & Plan  Dermatomycosis Clotrimazole /betameth twice daily for 1 week then as needed  Dry skin dermatitis Worse symptoms due to use of indoor heat, recommend dasily use of baby oil which she can tolerate  Globus sensation Controlled with bedtime elavil  per GI  "

## 2024-03-01 ENCOUNTER — Encounter: Admitting: Family Medicine

## 2024-08-24 ENCOUNTER — Ambulatory Visit

## 2024-08-26 ENCOUNTER — Ambulatory Visit
